# Patient Record
Sex: Male | Born: 1945
Health system: Southern US, Community
[De-identification: ages and names within clinical notes are randomized; demographics above are authoritative.]

## PROBLEM LIST (undated history)

## (undated) DIAGNOSIS — C61 Malignant neoplasm of prostate: Secondary | ICD-10-CM

## (undated) DIAGNOSIS — I639 Cerebral infarction, unspecified: Secondary | ICD-10-CM

## (undated) DIAGNOSIS — N529 Male erectile dysfunction, unspecified: Secondary | ICD-10-CM

## (undated) DIAGNOSIS — N200 Calculus of kidney: Secondary | ICD-10-CM

## (undated) HISTORY — DX: Malignant neoplasm of prostate: C61

## (undated) HISTORY — DX: Cerebral infarction, unspecified: I63.9

## (undated) HISTORY — DX: Calculus of kidney: N20.0

## (undated) HISTORY — DX: Male erectile dysfunction, unspecified: N52.9

## (undated) HISTORY — PX: ROBOT ASSISTED LAPAROSCOPIC RADICAL PROSTATECTOMY: SHX5141

---

## 2009-08-06 DIAGNOSIS — C61 Malignant neoplasm of prostate: Secondary | ICD-10-CM

## 2009-08-06 HISTORY — DX: Malignant neoplasm of prostate: C61

## 2009-10-07 ENCOUNTER — Encounter: Payer: Self-pay | Admitting: Urology

## 2009-10-07 ENCOUNTER — Inpatient Hospital Stay (HOSPITAL_COMMUNITY): Admission: RE | Admit: 2009-10-07 | Discharge: 2009-10-08 | Payer: Self-pay | Admitting: Urology

## 2010-09-21 LAB — TYPE AND SCREEN
ABO/RH(D): A POS
Antibody Screen: NEGATIVE

## 2010-09-21 LAB — BASIC METABOLIC PANEL
Calcium: 7.9 mg/dL — ABNORMAL LOW (ref 8.4–10.5)
Creatinine, Ser: 0.89 mg/dL (ref 0.4–1.5)
GFR calc Af Amer: >60 mL/min (ref >60)

## 2010-09-21 LAB — HEMOGLOBIN AND HEMATOCRIT, BLOOD
HCT: 40.5 % (ref 39.0–52.0)
Hemoglobin: 14.1 g/dL (ref 13.0–17.0)

## 2010-09-21 LAB — CBC
RBC: 4.85 MIL/uL (ref 4.22–5.81)
WBC: 4.5 10*3/uL (ref 4.0–10.5)

## 2010-09-21 LAB — ABO/RH: ABO/RH(D): A POS

## 2011-03-08 ENCOUNTER — Ambulatory Visit
Admission: RE | Admit: 2011-03-08 | Discharge: 2011-03-08 | Disposition: A | Discharge status: Home / Self Care | Payer: Medicare Other | Source: Ambulatory Visit | Attending: Radiation Oncology | Admitting: Radiation Oncology

## 2011-03-08 DIAGNOSIS — C61 Malignant neoplasm of prostate: Secondary | ICD-10-CM | POA: Insufficient documentation

## 2011-03-08 DIAGNOSIS — Z510 Encounter for antineoplastic radiation therapy: Secondary | ICD-10-CM | POA: Insufficient documentation

## 2011-05-02 ENCOUNTER — Encounter: Payer: Self-pay | Admitting: *Deleted

## 2011-05-08 ENCOUNTER — Ambulatory Visit
Admission: RE | Admit: 2011-05-08 | Discharge: 2011-05-08 | Disposition: A | Discharge status: Home / Self Care | Payer: Medicare Other | Source: Ambulatory Visit | Admitting: Radiation Oncology

## 2011-05-08 ENCOUNTER — Ambulatory Visit
Admission: RE | Admit: 2011-05-08 | Discharge: 2011-05-08 | Disposition: A | Discharge status: Home / Self Care | Payer: Medicare Other | Source: Ambulatory Visit | Attending: Radiation Oncology | Admitting: Radiation Oncology

## 2011-05-08 NOTE — Progress Notes (Signed)
[facility] Radiation Oncology Weekly Treatment Note    Name: Billy Fuller Date: 05/08/2011 MRN: [id number] DOB: 28-Nov-1945  Status:outpatient    Current dose: 5220  Current fraction:29  Planned dose:6840  Planned fraction38           NARRATIVE: Billy Fuller was seen today for weekly treatment management. The chart was checked and MVCT images were reviewed.  PHYSICAL EXAMINATION:lungs clear, heart rrr, abd soft , wt 163.8 lbs.     ASSESSMENT: Patient tolerating treatments well.    PLAN: Continue treatment as planned.

## 2011-05-09 ENCOUNTER — Ambulatory Visit
Admission: RE | Admit: 2011-05-09 | Discharge: 2011-05-09 | Disposition: A | Discharge status: Home / Self Care | Payer: Medicare Other | Source: Ambulatory Visit | Attending: Radiation Oncology | Admitting: Radiation Oncology

## 2011-05-10 ENCOUNTER — Ambulatory Visit
Admission: RE | Admit: 2011-05-10 | Discharge: 2011-05-10 | Disposition: A | Discharge status: Home / Self Care | Payer: Medicare Other | Source: Ambulatory Visit | Attending: Radiation Oncology | Admitting: Radiation Oncology

## 2011-05-11 ENCOUNTER — Ambulatory Visit
Admission: RE | Admit: 2011-05-11 | Discharge: 2011-05-11 | Disposition: A | Discharge status: Home / Self Care | Payer: Medicare Other | Source: Ambulatory Visit | Attending: Radiation Oncology | Admitting: Radiation Oncology

## 2011-05-12 ENCOUNTER — Ambulatory Visit: Payer: Medicare Other

## 2011-05-15 ENCOUNTER — Ambulatory Visit
Admission: RE | Admit: 2011-05-15 | Discharge: 2011-05-15 | Disposition: A | Discharge status: Home / Self Care | Payer: Medicare Other | Source: Ambulatory Visit | Attending: Radiation Oncology | Admitting: Radiation Oncology

## 2011-05-16 ENCOUNTER — Ambulatory Visit
Admission: RE | Admit: 2011-05-16 | Discharge: 2011-05-16 | Disposition: A | Discharge status: Home / Self Care | Payer: Medicare Other | Source: Ambulatory Visit | Attending: Radiation Oncology | Admitting: Radiation Oncology

## 2011-05-16 VITALS — BP 123/62 | HR 75 | Temp 97.0°F | Wt 165.4 lb

## 2011-05-16 DIAGNOSIS — C61 Malignant neoplasm of prostate: Secondary | ICD-10-CM

## 2011-05-16 NOTE — Progress Notes (Signed)
DIAGNOSIS:  Recurrent prostate cancer.  NARRATIVE:  Mr. Pavao is seen today for weekly assessment.  He has completed 5940 cGy of a planned 6840 cGy directed at the lower pelvis area.  The patient continues to tolerate his treatments quite well without any  significant fatigue, bowel or bladder complaints.  PHYSICAL EXAMINATION:  The patient's weight is 165.4 pounds.  The lungs are clear.  The heart has a regular rhythm and rate.  The abdomen is soft and nontender with normal bowel sounds.  IMPRESSION/PLAN:  The patient is tolerating his radiation treatments extremely well at this time.  The patient's radiation fields are setting up accurately.  The patient's radiation chart was checked today.  Plan is to continue to a cumulative dose of 6840 cGy as salvage therapy.    ______________________________ [name], Ph.D., M.D. JDK/MEDQ  D:  05/16/2011  T:  05/16/2011  Job:  [id number]

## 2011-05-16 NOTE — Progress Notes (Signed)
Denies any fatigue,  urinary frequency, dysuria, urinary urgency diarrhea, or proctitis.

## 2011-05-17 ENCOUNTER — Ambulatory Visit
Admission: RE | Admit: 2011-05-17 | Discharge: 2011-05-17 | Disposition: A | Discharge status: Home / Self Care | Payer: Medicare Other | Source: Ambulatory Visit | Attending: Radiation Oncology | Admitting: Radiation Oncology

## 2011-05-18 ENCOUNTER — Ambulatory Visit
Admission: RE | Admit: 2011-05-18 | Discharge: 2011-05-18 | Disposition: A | Discharge status: Home / Self Care | Payer: Medicare Other | Source: Ambulatory Visit | Attending: Radiation Oncology | Admitting: Radiation Oncology

## 2011-05-19 ENCOUNTER — Ambulatory Visit
Admission: RE | Admit: 2011-05-19 | Discharge: 2011-05-19 | Disposition: A | Discharge status: Home / Self Care | Payer: Medicare Other | Source: Ambulatory Visit | Attending: Radiation Oncology | Admitting: Radiation Oncology

## 2011-05-22 ENCOUNTER — Ambulatory Visit
Admission: RE | Admit: 2011-05-22 | Discharge: 2011-05-22 | Disposition: A | Discharge status: Home / Self Care | Payer: Medicare Other | Source: Ambulatory Visit | Attending: Radiation Oncology | Admitting: Radiation Oncology

## 2011-05-22 VITALS — Wt 167.2 lb

## 2011-05-22 DIAGNOSIS — C61 Malignant neoplasm of prostate: Secondary | ICD-10-CM

## 2011-05-22 NOTE — Progress Notes (Signed)
CC:   Excell Seltzer. Annabell Howells, M.D. Emeterio Reeve, MD  DIAGNOSIS:  Recurrent prostate cancer.  INDICATION FOR THERAPY:  Salvage treatment.  TREATMENT DATES:  March 27, 2011 through May 22, 2011.  SITES/DOSE:  Central lower pelvis area, 6840 cGy in 38 fractions (180 cGy per fraction).  ENERGY/FIELDS:  The patient was treated with helical IMRT throughout his course of treatment.  6 MV photons were used to deliver the patient's treatment.  In addition, the patient underwent image-guided therapy with onboard megavoltage CT scanner.  NARRATIVE:  Mr. Billy Fuller tolerated his treatments extremely well.  He denies experiencing any significant fatigue, GU, or GI symptoms throughout his course of treatment.  FOLLOW UP APPOINTMENT:  One month.    ______________________________ [name], Ph.D., M.D. JDK/MEDQ  D:  05/22/2011  T:  05/22/2011  Job:  [id number]

## 2011-05-22 NOTE — Progress Notes (Signed)
DIAGNOSIS:  Recurrent prostate cancer.  NARRATIVE:  Billy Fuller was seen today for weekly assessment.  He completed his last radiation therapy directed at the "prostate bed" earlier today, totaling 6840 cGy.  The patient continues to tolerate his treatments essentially without any side effects.  He specifically denies any bowel or bladder problems, or fatigue.  EXAMINATION:  The patient's weight is 167, which is up a couple pounds since weighing last week.  The abdomen is soft and nontender with normal bowel sounds.  IMPRESSION AND PLAN:  The patient is tolerating his treatments quite well.  The patient's radiation fields were set up accurately throughout his course of treatment.  The patient's radiation chart was checked today.  Plan is to followup in mid December.    ______________________________ Billy Fuller, Ph.D., M.D. JDK/MEDQ  D:  05/22/2011  T:  05/22/2011  Job:  [id number]

## 2011-05-22 NOTE — Procedures (Signed)
DIAGNOSIS:  Recurrent prostate cancer.  NARRATIVE:  On June 19, 2011, Mr. Billy Fuller had completion of his IMRT plan in generation of his IMRT treatment device.  The patient will be treated with 2.9 sonogram segments on the tomotherapy unit.  This constitutes 1 IMRT device.    ______________________________ [name], Ph.D., M.D. JDK/MEDQ  D:  05/22/2011  T:  05/22/2011  Job:  [id number]

## 2011-05-22 NOTE — Progress Notes (Signed)
Pt completed today, "i'm exactly the same, no changes  No fatigue, no pain,nothing", f/u appt card given dec 17,2012  4:52 PM

## 2011-05-22 NOTE — Progress Notes (Signed)
DIAGNOSIS:  Recurrent prostate cancer.  NARRATIVE:  On March 20, 2011, Billy Fuller had completion of his IMRT plan directed at the central lower pelvis area ("prostate bed"). IMRT was chosen over conventional or conformal radiation therapy to more accurately target the area of concern and to limit dose to normal surrounding critical structures, i.e., the bladder and rectum.  Dose volume histograms of the target area as well as critical structures were reviewed, accepted, and placed in the patient's chart.  Plan is for the patient received 38 treatments at 180 cGy per day for a cumulative dose to the target area of 6840 cGy.  6 MV photons will be used to deliver the patient's treatment.    ______________________________ [name], Ph.D., M.D. JDK/MEDQ  D:  05/22/2011  T:  05/22/2011  Job:  [id number]

## 2011-06-14 ENCOUNTER — Encounter: Payer: Self-pay | Admitting: *Deleted

## 2011-06-14 ENCOUNTER — Encounter: Payer: Self-pay | Admitting: Radiation Oncology

## 2011-06-14 NOTE — Progress Notes (Signed)
Follow up recurrent prostate cancer Radiation therapy 03/27/11-05/22/11 central lower pelvis area,  Dx 08/06/09 psa then 6.23,volume=40cc,gleason-3+4=7 and 3+3=6 Last Psa 02/14/11= 0.07Dr. Rolanda Lundborg Shagger/part time painter/construction  Nkda

## 2011-06-19 ENCOUNTER — Ambulatory Visit: Payer: Medicare Other | Admitting: Radiation Oncology

## 2011-07-17 ENCOUNTER — Ambulatory Visit: Payer: Medicare Other | Admitting: Radiation Oncology

## 2011-07-24 ENCOUNTER — Ambulatory Visit: Payer: Medicare Other | Admitting: Radiation Oncology

## 2011-08-07 ENCOUNTER — Ambulatory Visit
Admission: RE | Admit: 2011-08-07 | Discharge: 2011-08-07 | Disposition: A | Discharge status: Home / Self Care | Payer: Medicare Other | Source: Ambulatory Visit | Attending: Radiation Oncology | Admitting: Radiation Oncology

## 2011-08-07 VITALS — BP 134/83 | HR 73 | Temp 97.6°F | Wt 162.1 lb

## 2011-08-07 DIAGNOSIS — C61 Malignant neoplasm of prostate: Secondary | ICD-10-CM

## 2011-08-07 NOTE — Progress Notes (Signed)
Here for routine follow up post radiation  to prostate. Doing great.Denies frequency,burning and nocturia.

## 2011-08-07 NOTE — Progress Notes (Signed)
CC:   Excell Seltzer. Annabell Howells, M.D. Emeterio Reeve, MD  DIAGNOSIS:  Recurrent prostate cancer.  INTERVAL SINCE RADIATION THERAPY:  2-1/2 months.  NARRATIVE:  Mr. Billy Fuller comes in today for routine followup.  He clinically is doing quite well.  The patient denies any dysuria, hematuria, increased frequency or nocturia.  The patient denies any bowel complaints.  His energy level is good.  The patient did see Dr. Annabell Howells late last month and the patient's post treatment PSA was down to 0.03.  The patient's pretreatment PSA was 0.07.  PHYSICAL EXAMINATION:  Today the patient's temperature is 97.6, pulse 73, blood pressure is 134/83, weight is 162 pounds.  Examination of the neck and supraclavicular region reveals no evidence of adenopathy.  The lungs are clear to auscultation.  The heart has a regular rhythm and rate.  The abdomen is soft and nontender with normal bowel sounds.  IMPRESSION/PLAN:  The patient is doing well at this time with a nice PSA drop.  The patient will see Dr. Annabell Howells later this spring.  Routine followup in Radiation Oncology in 6 months.    ______________________________ [name], Ph.D., M.D. JDK/MEDQ  D:  08/07/2011  T:  08/07/2011  Job:  [id number]

## 2012-01-31 ENCOUNTER — Encounter: Payer: Self-pay | Admitting: Radiation Oncology

## 2012-01-31 ENCOUNTER — Ambulatory Visit
Admission: RE | Admit: 2012-01-31 | Discharge: 2012-01-31 | Disposition: A | Discharge status: Home / Self Care | Payer: Medicare Other | Source: Ambulatory Visit | Attending: Radiation Oncology | Admitting: Radiation Oncology

## 2012-01-31 VITALS — BP 120/75 | HR 71 | Temp 97.6°F | Resp 20 | Wt 164.6 lb

## 2012-01-31 DIAGNOSIS — C61 Malignant neoplasm of prostate: Secondary | ICD-10-CM

## 2012-01-31 NOTE — Progress Notes (Signed)
Pt denies pain, fatigue, loss of appetite, urinary, bowels problems.

## 2012-01-31 NOTE — Progress Notes (Signed)
  Radiation Oncology         [phone] ________________________________  Name: Billy Fuller MRN: [id number]  Date: 01/31/2012  DOB: 03/25/1946  Follow-Up Visit Note  CC: No primary provider on file.  Anner Crete, MD  Diagnosis:   Recurrent prostate cancer  Interval Since Last Radiation:  8 months  Narrative:  The patient returns today for routine follow-up.  He clinically seems to be doing well at this time. Patient denies any urination difficulties hematuria or bowel complaints. He denies any rectal bleeding. After completion of the patient's salvage radiation therapy his PSA did drop to 0.03 ng/mL. However in April patient's PSA rose to 0.06 and now most recent PSA July 15 showed a further rise to 0.33 ng/mL.  Patient did meet with Dr. Annabell Howells this month and discussed  androgen ablation therapy.     There are no immediate plans to initiate this therapy.                      ALLERGIES:   has no known allergies.  Meds: No current outpatient prescriptions on file.    Physical Findings: The patient is in no acute distress. Patient is alert and oriented.  weight is 164 lb 9.6 oz (74.662 kg). His oral temperature is 97.6 F (36.4 C). His blood pressure is 120/75 and his pulse is 71. His respiration is 20. Marland Kitchen  No palpable cervical subclavicular or axillary adenopathy. The lungs are clear to auscultation. The heart has regular rhythm and rate. The abdomen is soft and nontender with normal bowel sounds.  Lab Findings: Lab Results  Component Value Date   WBC 4.5 10/01/2009   HGB 12.8* 10/08/2009   HCT 36.6* 10/08/2009   MCV 94.7 10/01/2009   PLT 143* 10/01/2009    @LASTCHEM @  Radiographic Findings: No results found.  Impression:  The patient is clinically doing well after his salvage radiation therapy. He however has biochemical evidence of progressive disease with a rising PSA.  Plan:  When necessary followup.  The patient will continue close followup with Dr.  Annabell Howells.  _____________________________________    [name], PhD, MD

## 2012-02-05 ENCOUNTER — Ambulatory Visit: Payer: Medicare Other | Admitting: Radiation Oncology

## 2012-02-12 ENCOUNTER — Ambulatory Visit: Payer: Medicare Other | Admitting: Radiation Oncology

## 2012-03-08 ENCOUNTER — Other Ambulatory Visit: Payer: Self-pay | Admitting: Urology

## 2012-03-08 DIAGNOSIS — C61 Malignant neoplasm of prostate: Secondary | ICD-10-CM

## 2012-03-20 ENCOUNTER — Encounter (HOSPITAL_COMMUNITY)
Admission: RE | Admit: 2012-03-20 | Discharge: 2012-03-20 | Disposition: A | Discharge status: Home / Self Care | Payer: Medicare Other | Source: Ambulatory Visit | Attending: Urology | Admitting: Urology

## 2012-03-20 ENCOUNTER — Encounter (HOSPITAL_COMMUNITY): Payer: Self-pay

## 2012-03-20 DIAGNOSIS — C61 Malignant neoplasm of prostate: Secondary | ICD-10-CM | POA: Insufficient documentation

## 2012-03-20 DIAGNOSIS — Z006 Encounter for examination for normal comparison and control in clinical research program: Secondary | ICD-10-CM | POA: Insufficient documentation

## 2012-03-20 MED ORDER — FLUDEOXYGLUCOSE F - 18 (FDG) INJECTION
10.8000 | Freq: Once | INTRAVENOUS | Status: AC | PRN
  Administered 2012-03-20: 10.8 via INTRAVENOUS

## 2012-07-12 ENCOUNTER — Other Ambulatory Visit: Payer: Self-pay | Admitting: Urology

## 2012-07-12 DIAGNOSIS — C61 Malignant neoplasm of prostate: Secondary | ICD-10-CM

## 2012-08-05 ENCOUNTER — Encounter (HOSPITAL_COMMUNITY)
Admission: RE | Admit: 2012-08-05 | Discharge: 2012-08-05 | Disposition: A | Discharge status: Home / Self Care | Payer: Medicare Other | Source: Ambulatory Visit | Attending: Urology | Admitting: Urology

## 2012-08-05 DIAGNOSIS — C61 Malignant neoplasm of prostate: Secondary | ICD-10-CM | POA: Insufficient documentation

## 2012-08-05 MED ORDER — TECHNETIUM TC 99M MEDRONATE IV KIT
25.0000 | PACK | Freq: Once | INTRAVENOUS | Status: AC | PRN
  Administered 2012-08-05: 25 via INTRAVENOUS

## 2012-12-13 ENCOUNTER — Other Ambulatory Visit: Payer: Self-pay | Admitting: Urology

## 2012-12-13 DIAGNOSIS — C61 Malignant neoplasm of prostate: Secondary | ICD-10-CM

## 2012-12-18 ENCOUNTER — Encounter (HOSPITAL_COMMUNITY): Payer: Self-pay

## 2012-12-18 ENCOUNTER — Encounter (HOSPITAL_COMMUNITY)
Admission: RE | Admit: 2012-12-18 | Discharge: 2012-12-18 | Disposition: A | Discharge status: Home / Self Care | Payer: Medicare Other | Source: Ambulatory Visit | Attending: Urology | Admitting: Urology

## 2012-12-18 DIAGNOSIS — C61 Malignant neoplasm of prostate: Secondary | ICD-10-CM

## 2012-12-18 MED ORDER — FLUDEOXYGLUCOSE F - 18 (FDG) INJECTION
13.1000 | Freq: Once | INTRAVENOUS | Status: AC | PRN
  Administered 2012-12-18: 13.1 via INTRAVENOUS

## 2014-08-13 DIAGNOSIS — G5601 Carpal tunnel syndrome, right upper limb: Secondary | ICD-10-CM | POA: Diagnosis not present

## 2014-08-13 DIAGNOSIS — G5602 Carpal tunnel syndrome, left upper limb: Secondary | ICD-10-CM | POA: Diagnosis not present

## 2014-09-01 DIAGNOSIS — G5602 Carpal tunnel syndrome, left upper limb: Secondary | ICD-10-CM | POA: Diagnosis not present

## 2014-09-01 DIAGNOSIS — G5601 Carpal tunnel syndrome, right upper limb: Secondary | ICD-10-CM | POA: Diagnosis not present

## 2014-09-09 DIAGNOSIS — G5601 Carpal tunnel syndrome, right upper limb: Secondary | ICD-10-CM | POA: Diagnosis not present

## 2014-09-23 DIAGNOSIS — C61 Malignant neoplasm of prostate: Secondary | ICD-10-CM | POA: Diagnosis not present

## 2014-10-16 DIAGNOSIS — M79644 Pain in right finger(s): Secondary | ICD-10-CM | POA: Diagnosis not present

## 2014-10-16 DIAGNOSIS — G5601 Carpal tunnel syndrome, right upper limb: Secondary | ICD-10-CM | POA: Diagnosis not present

## 2014-10-16 DIAGNOSIS — M25441 Effusion, right hand: Secondary | ICD-10-CM | POA: Diagnosis not present

## 2014-10-16 DIAGNOSIS — M25641 Stiffness of right hand, not elsewhere classified: Secondary | ICD-10-CM | POA: Diagnosis not present

## 2014-10-20 DIAGNOSIS — G5601 Carpal tunnel syndrome, right upper limb: Secondary | ICD-10-CM | POA: Diagnosis not present

## 2014-10-20 DIAGNOSIS — M25641 Stiffness of right hand, not elsewhere classified: Secondary | ICD-10-CM | POA: Diagnosis not present

## 2014-10-20 DIAGNOSIS — M25441 Effusion, right hand: Secondary | ICD-10-CM | POA: Diagnosis not present

## 2014-10-20 DIAGNOSIS — M79644 Pain in right finger(s): Secondary | ICD-10-CM | POA: Diagnosis not present

## 2015-04-05 DIAGNOSIS — C61 Malignant neoplasm of prostate: Secondary | ICD-10-CM | POA: Diagnosis not present

## 2015-04-21 DIAGNOSIS — Z23 Encounter for immunization: Secondary | ICD-10-CM | POA: Diagnosis not present

## 2015-04-26 DIAGNOSIS — N393 Stress incontinence (female) (male): Secondary | ICD-10-CM | POA: Diagnosis not present

## 2015-04-26 DIAGNOSIS — C61 Malignant neoplasm of prostate: Secondary | ICD-10-CM | POA: Diagnosis not present

## 2015-07-09 DIAGNOSIS — R05 Cough: Secondary | ICD-10-CM | POA: Diagnosis not present

## 2015-07-09 DIAGNOSIS — J069 Acute upper respiratory infection, unspecified: Secondary | ICD-10-CM | POA: Diagnosis not present

## 2015-07-09 DIAGNOSIS — R0602 Shortness of breath: Secondary | ICD-10-CM | POA: Diagnosis not present

## 2015-07-09 DIAGNOSIS — R062 Wheezing: Secondary | ICD-10-CM | POA: Diagnosis not present

## 2015-09-24 DIAGNOSIS — L989 Disorder of the skin and subcutaneous tissue, unspecified: Secondary | ICD-10-CM | POA: Diagnosis not present

## 2015-10-06 DIAGNOSIS — D485 Neoplasm of uncertain behavior of skin: Secondary | ICD-10-CM | POA: Diagnosis not present

## 2015-10-06 DIAGNOSIS — L821 Other seborrheic keratosis: Secondary | ICD-10-CM | POA: Diagnosis not present

## 2015-10-06 DIAGNOSIS — C44519 Basal cell carcinoma of skin of other part of trunk: Secondary | ICD-10-CM | POA: Diagnosis not present

## 2015-11-08 DIAGNOSIS — Z85828 Personal history of other malignant neoplasm of skin: Secondary | ICD-10-CM | POA: Diagnosis not present

## 2015-11-08 DIAGNOSIS — D485 Neoplasm of uncertain behavior of skin: Secondary | ICD-10-CM | POA: Diagnosis not present

## 2015-11-08 DIAGNOSIS — Z08 Encounter for follow-up examination after completed treatment for malignant neoplasm: Secondary | ICD-10-CM | POA: Diagnosis not present

## 2016-04-11 DIAGNOSIS — C61 Malignant neoplasm of prostate: Secondary | ICD-10-CM | POA: Diagnosis not present

## 2016-04-19 DIAGNOSIS — N393 Stress incontinence (female) (male): Secondary | ICD-10-CM | POA: Diagnosis not present

## 2016-04-19 DIAGNOSIS — Z8546 Personal history of malignant neoplasm of prostate: Secondary | ICD-10-CM | POA: Diagnosis not present

## 2016-04-19 DIAGNOSIS — N5201 Erectile dysfunction due to arterial insufficiency: Secondary | ICD-10-CM | POA: Diagnosis not present

## 2016-05-01 ENCOUNTER — Ambulatory Visit (INDEPENDENT_AMBULATORY_CARE_PROVIDER_SITE_OTHER): Payer: Medicare Other | Admitting: Family Medicine

## 2016-05-01 VITALS — BP 128/72 | HR 75 | Temp 97.9°F | Resp 18 | Ht 65.0 in | Wt 170.2 lb

## 2016-05-01 DIAGNOSIS — H6121 Impacted cerumen, right ear: Secondary | ICD-10-CM

## 2016-05-01 NOTE — Progress Notes (Signed)
   Subjective:    Patient ID: Brown Boros, male    DOB: 11-Dec-1945, 70 y.o.   MRN: [id number]  Chief Complaint  Patient presents with  . Ear Cleaning    right ear    PCP: No PCP Per Patient  HPI  This is a 70 y.o. male who is presenting with cerumen impaction. He was going to get his hearing aids fixed and was told he needed his ear cleaned out. He has had hearing aids for year. He denies any acute changes. He denies any trauma to his ear.  ROS: No unexpected weight loss, fever, chills, swelling, instability, muscle pain, numbness/tingling, redness, otherwise see HPI .   Review of Systems  PMH: prostate cancer  PShx: prostate removal  PSx: no tobacco use, occasional alcohol use  FHx: none     Objective:   Physical Exam BP 128/72   Pulse 75   Temp 97.9 F (36.6 C) (Oral)   Resp 18   Ht 5\' 5"  (1.651 m)   Wt 170 lb 3.2 oz (77.2 kg)   SpO2 97%   BMI 28.32 kg/m  Gen: NAD, alert, cooperative with exam, well-appearing HEENT:  EOMI, clear conjunctiva, occluded right ear canal, Left ear canal is clear.  Skin: no rashes, normal turgor  Neuro: no gross deficits.  Psych: alert and oriented      Assessment & Plan:   Impacted cerumen of right ear Able to visualize TM after irrigation but wax is still present. He didn't tolerate removal with alligator clamps.  - advised to try debrox.  - f/u PRN.

## 2016-05-01 NOTE — Assessment & Plan Note (Signed)
Able to visualize TM after irrigation but wax is still present. He didn't tolerate removal with alligator clamps.  - advised to try debrox.  - f/u PRN.

## 2016-05-01 NOTE — Patient Instructions (Addendum)
  Thank you for coming in,   Please try debrox which is an over the counter medication which helps loosen ear wax.    Please feel free to call with any questions or concerns at any time, at [phone]. --Dr. Raeford Razor    IF you received an x-ray today, you will receive an invoice from [facility]. Please contact [facility] at [phone] with questions or concerns regarding your invoice.   IF you received labwork today, you will receive an invoice from [employer]. Please contact [facility] at [phone] with questions or concerns regarding your invoice.   Our billing staff will not be able to assist you with questions regarding bills from these companies.  You will be contacted with the lab results as soon as they are available. The fastest way to get your results is to activate your My Chart account. Instructions are located on the last page of this paperwork. If you have not heard from [place] regarding the results in 2 weeks, please contact this office.

## 2016-05-05 DIAGNOSIS — Z23 Encounter for immunization: Secondary | ICD-10-CM | POA: Diagnosis not present

## 2016-05-30 ENCOUNTER — Encounter (HOSPITAL_COMMUNITY): Payer: Self-pay

## 2016-05-30 ENCOUNTER — Inpatient Hospital Stay (HOSPITAL_COMMUNITY)
Admission: EM | Admit: 2016-05-30 | Discharge: 2016-06-01 | DRG: 065 | Disposition: A | Discharge status: Home Health | Payer: Medicare Other | Attending: Family Medicine | Admitting: Family Medicine

## 2016-05-30 ENCOUNTER — Emergency Department (HOSPITAL_COMMUNITY): Payer: Medicare Other

## 2016-05-30 DIAGNOSIS — R269 Unspecified abnormalities of gait and mobility: Secondary | ICD-10-CM | POA: Diagnosis not present

## 2016-05-30 DIAGNOSIS — R531 Weakness: Secondary | ICD-10-CM | POA: Diagnosis not present

## 2016-05-30 DIAGNOSIS — R29818 Other symptoms and signs involving the nervous system: Secondary | ICD-10-CM | POA: Diagnosis not present

## 2016-05-30 DIAGNOSIS — G8194 Hemiplegia, unspecified affecting left nondominant side: Secondary | ICD-10-CM | POA: Diagnosis present

## 2016-05-30 DIAGNOSIS — Z87891 Personal history of nicotine dependence: Secondary | ICD-10-CM

## 2016-05-30 DIAGNOSIS — G459 Transient cerebral ischemic attack, unspecified: Secondary | ICD-10-CM | POA: Diagnosis present

## 2016-05-30 DIAGNOSIS — I639 Cerebral infarction, unspecified: Principal | ICD-10-CM | POA: Diagnosis present

## 2016-05-30 DIAGNOSIS — Z8546 Personal history of malignant neoplasm of prostate: Secondary | ICD-10-CM

## 2016-05-30 DIAGNOSIS — R278 Other lack of coordination: Secondary | ICD-10-CM | POA: Diagnosis not present

## 2016-05-30 DIAGNOSIS — R200 Anesthesia of skin: Secondary | ICD-10-CM | POA: Diagnosis not present

## 2016-05-30 DIAGNOSIS — E782 Mixed hyperlipidemia: Secondary | ICD-10-CM | POA: Diagnosis present

## 2016-05-30 DIAGNOSIS — I6789 Other cerebrovascular disease: Secondary | ICD-10-CM | POA: Diagnosis not present

## 2016-05-30 LAB — I-STAT CHEM 8, ED
BUN: 15 mg/dL (ref 6–20)
Calcium, Ion: 1.15 mmol/L (ref 1.15–1.40)
Chloride: 103 mmol/L (ref 101–111)
Creatinine, Ser: 1.2 mg/dL (ref 0.61–1.24)
Glucose, Bld: 123 mg/dL — ABNORMAL HIGH (ref 65–99)
HEMATOCRIT: 48 % (ref 39.0–52.0)
HEMOGLOBIN: 16.3 g/dL (ref 13.0–17.0)
Potassium: 3.7 mmol/L (ref 3.5–5.1)
SODIUM: 141 mmol/L (ref 135–145)
TCO2: 25 mmol/L (ref 0–100)

## 2016-05-30 LAB — DIFFERENTIAL
BASOS ABS: 0 10*3/uL (ref 0.0–0.1)
BASOS PCT: 1 %
EOS ABS: 0.1 10*3/uL (ref 0.0–0.7)
Eosinophils Relative: 1 %
Lymphocytes Relative: 31 %
Lymphs Abs: 1.6 10*3/uL (ref 0.7–4.0)
MONO ABS: 0.5 10*3/uL (ref 0.1–1.0)
MONOS PCT: 11 %
Neutro Abs: 2.9 10*3/uL (ref 1.7–7.7)
Neutrophils Relative %: 56 %

## 2016-05-30 LAB — COMPREHENSIVE METABOLIC PANEL
ALT: 18 U/L (ref 17–63)
AST: 24 U/L (ref 15–41)
Albumin: 4.2 g/dL (ref 3.5–5.0)
Alkaline Phosphatase: 59 U/L (ref 38–126)
Anion gap: 9 (ref 5–15)
BUN: 13 mg/dL (ref 6–20)
CHLORIDE: 105 mmol/L (ref 101–111)
CO2: 25 mmol/L (ref 22–32)
Calcium: 9.6 mg/dL (ref 8.9–10.3)
Creatinine, Ser: 1.17 mg/dL (ref 0.61–1.24)
Glucose, Bld: 124 mg/dL — ABNORMAL HIGH (ref 65–99)
POTASSIUM: 3.7 mmol/L (ref 3.5–5.1)
Sodium: 139 mmol/L (ref 135–145)
Total Bilirubin: 0.7 mg/dL (ref 0.3–1.2)
Total Protein: 6.3 g/dL — ABNORMAL LOW (ref 6.5–8.1)

## 2016-05-30 LAB — CBC
HCT: 45.5 % (ref 39.0–52.0)
Hemoglobin: 16.6 g/dL (ref 13.0–17.0)
MCH: 32.9 pg (ref 26.0–34.0)
MCHC: 36.5 g/dL — AB (ref 30.0–36.0)
MCV: 90.1 fL (ref 78.0–100.0)
PLATELETS: 133 10*3/uL — AB (ref 150–400)
RBC: 5.05 MIL/uL (ref 4.22–5.81)
RDW: 12.8 % (ref 11.5–15.5)
WBC: 5.1 10*3/uL (ref 4.0–10.5)

## 2016-05-30 LAB — PROTIME-INR
INR: 0.96
Prothrombin Time: 12.8 seconds (ref 11.4–15.2)

## 2016-05-30 LAB — I-STAT TROPONIN, ED: TROPONIN I, POC: 0 ng/mL (ref 0.00–0.08)

## 2016-05-30 LAB — CBG MONITORING, ED: Glucose-Capillary: 82 mg/dL (ref 65–99)

## 2016-05-30 LAB — APTT: aPTT: 28 seconds (ref 24–36)

## 2016-05-30 MED ORDER — SODIUM CHLORIDE 0.9% FLUSH
3.0000 mL | Freq: Two times a day (BID) | INTRAVENOUS | Status: DC
  Administered 2016-05-30 – 2016-06-01 (×3): 3 mL via INTRAVENOUS

## 2016-05-30 MED ORDER — ENOXAPARIN SODIUM 40 MG/0.4ML ~~LOC~~ SOLN
40.0000 mg | SUBCUTANEOUS | Status: DC
  Administered 2016-05-30 – 2016-05-31 (×2): 40 mg via SUBCUTANEOUS
  Filled 2016-05-30 (×2): qty 0.4

## 2016-05-30 MED ORDER — SODIUM CHLORIDE 0.45 % IV SOLN: INTRAVENOUS | Status: DC

## 2016-05-30 MED ORDER — ASPIRIN EC 81 MG PO TBEC
81.0000 mg | DELAYED_RELEASE_TABLET | Freq: Every day | ORAL | Status: DC
  Administered 2016-05-30 – 2016-05-31 (×2): 81 mg via ORAL
  Filled 2016-05-30 (×2): qty 1

## 2016-05-30 MED ORDER — ACETAMINOPHEN 650 MG RE SUPP
650.0000 mg | Freq: Four times a day (QID) | RECTAL | Status: DC | PRN
Start: 2016-05-30 — End: 2016-06-01

## 2016-05-30 MED ORDER — ACETAMINOPHEN 325 MG PO TABS: 650.0000 mg | ORAL_TABLET | Freq: Four times a day (QID) | ORAL | Status: DC | PRN

## 2016-05-30 NOTE — Code Documentation (Signed)
70 year old male presents to [facility] as code stroke.  Patient is alert - states he was normal at 1130 - at 1200 he went to kitchen to prepare food and then his left side became weak and numb - he fell as his leg gave away and was unable to hold a glass in his hand.  He thought his left side had gone to sleep after sitting for a while so he did nothing about it.  Later on he got up 3 other times and fell also.  EMS reports left side weakness.  On arrival he has no drift - ataxia left arm and leg - and some sensory loss on left.  NIHHS 3.  Dr. Shon Hale to bedside.  Outside tPA window.  No acute treatment ordered.  Handoff to [employer] - to call as needed if symptoms worsen.

## 2016-05-30 NOTE — ED Provider Notes (Signed)
[place] DEPT Provider Note   CSN: [id number] Arrival date & time: 05/30/16  [id number]     History   Chief Complaint Chief Complaint  Patient presents with  . Code Stroke    HPI Billy Fuller is a 70 y.o. male.  HPI Patient is a 70 year old male who presents with left sided weakness and numbness. Patient reports he is in his usual state of health today until he experienced multiple falls to the left side. Following this he noticed notable weakness in his left leg. Patient last normal at 11:30 AM. He put something in an intact family. After 30 minutes he attempted to stand up and fell to the left due to left leg numbness and weakness. He was subsequently able to ambulate but then experienced progressive left sided numbness and weakness involving his left neck/jaw, left arm and left leg and experienced 3 more falls. In addition patient had difficulty with handgrip on the left. Patient finally told his fiance that his symptoms who called EMS. Code stroke initiated prior to arrival.   Past Medical History:  Diagnosis Date  . Cataract    eye implants  . ED (erectile dysfunction)   . Nephrolithiasis   . Prostate CA ([place]) 08/06/09   recurrent  . Wears dentures    upper    Patient Active Problem List   Diagnosis Date Noted  . Left-sided weakness 05/30/2016  . TIA (transient ischemic attack) 05/30/2016  . Impacted cerumen of right ear 05/01/2016  . Prostate cancer ([place]) 05/16/2011  . Prostate CA ([place]) 08/06/2009    Past Surgical History:  Procedure Laterality Date  . ROBOT ASSISTED LAPAROSCOPIC RADICAL PROSTATECTOMY     10/06/09       Home Medications    Prior to Admission medications   Not on File    Family History Family History  Problem Relation Age of Onset  . Cancer Mother     breast  . Kidney failure Father     Social History Social History  Substance Use Topics  . Smoking status: Former Smoker    Quit date: 05/31/1979  . Smokeless tobacco: Never Used   . Alcohol use Yes     Comment: 4 alcoholic beverages/week     Allergies   Patient has no known allergies.   Review of Systems Review of Systems  Constitutional: Negative for chills and fever.  HENT: Negative for ear pain and sore throat.   Eyes: Negative for pain and visual disturbance.  Respiratory: Negative for cough and shortness of breath.   Cardiovascular: Negative for chest pain and palpitations.  Gastrointestinal: Negative for abdominal pain and vomiting.  Genitourinary: Negative for dysuria and hematuria.  Musculoskeletal: Negative for arthralgias and back pain.  Skin: Negative for color change and rash.  Neurological: Positive for weakness and numbness. Negative for dizziness, seizures, syncope, facial asymmetry and headaches.  All other systems reviewed and are negative.    Physical Exam Updated Vital Signs BP 125/78 (BP Location: Right Arm)   Pulse 61   Temp 98.2 F (36.8 C) (Oral)   Resp 18   Ht 5\' 5"  (1.651 m)   Wt 77.6 kg   SpO2 98%   BMI 28.46 kg/m   Physical Exam  Constitutional: He is oriented to person, place, and time. He appears well-developed and well-nourished.  HENT:  Head: Normocephalic and atraumatic.  Eyes: Conjunctivae are normal.  Neck: Neck supple.  Cardiovascular: Normal rate and regular rhythm.   No murmur heard. Pulmonary/Chest: Effort normal and breath  sounds normal. No respiratory distress.  Abdominal: Soft. There is no tenderness.  Musculoskeletal: He exhibits no edema.  Neurological: He is alert and oriented to person, place, and time.  4+/5 strength LUE and LLE. 5/5 strength RUE and RLE. Mild left sided dysmetria.   Skin: Skin is warm and dry.  Psychiatric: He has a normal mood and affect.  Nursing note and vitals reviewed.    ED Treatments / Results  Labs (all labs ordered are listed, but only abnormal results are displayed) Labs Reviewed  CBC - Abnormal; Notable for the following:       Result Value   MCHC 36.5  (*)    Platelets 133 (*)    All other components within normal limits  COMPREHENSIVE METABOLIC PANEL - Abnormal; Notable for the following:    Glucose, Bld 124 (*)    Total Protein 6.3 (*)    All other components within normal limits  I-STAT CHEM 8, ED - Abnormal; Notable for the following:    Glucose, Bld 123 (*)    All other components within normal limits  PROTIME-INR  APTT  DIFFERENTIAL  HEMOGLOBIN A1C  COMPREHENSIVE METABOLIC PANEL  CBC  LIPID PANEL  I-STAT TROPOININ, ED  CBG MONITORING, ED    EKG  EKG Interpretation None       Radiology Ct Head Code Stroke W/o Cm  Result Date: 05/30/2016 CLINICAL DATA:  Code stroke. 70 year old male left lower extremity weakness. Last seen normal levin 30 hours. Initial encounter. EXAM: CT HEAD WITHOUT CONTRAST TECHNIQUE: Contiguous axial images were obtained from the base of the skull through the vertex without intravenous contrast. COMPARISON:  PET-CT 12/18/2012. FINDINGS: Brain: No midline shift, ventriculomegaly, mass effect, evidence of mass lesion, intracranial hemorrhage or evidence of cortically based acute infarction. Gray-white matter differentiation is within normal limits throughout the brain. No cortical encephalomalacia identified. Vascular: Calcified atherosclerosis at the skull base. No suspicious intracranial vascular hyperdensity. Skull: No acute osseous abnormality identified. Sinuses/Orbits: Visualized paranasal sinuses and mastoids are stable and well pneumatized. Incidental bilateral EAC hearing aids. Other: No acute orbit or scalp soft tissue finding. ASPECTS [facility] Stroke Program Early CT Score) - Ganglionic level infarction (caudate, lentiform nuclei, internal capsule, insula, M1-M3 cortex): 7 - Supraganglionic infarction (M4-M6 cortex): 3 Total score (0-10 with 10 being normal): 10 IMPRESSION: 1. Normal noncontrast CT appearance of the brain. 2. ASPECTS is 10. 3. The above was relayed via text pager to Dr. Milus Banister on  05/30/2016 at 17:59 . Electronically Signed   By: Genevie Bion Todorov M.D.   On: 05/30/2016 17:59    Procedures Procedures (including critical care time)  Medications Ordered in ED Medications  enoxaparin (LOVENOX) injection 40 mg (40 mg Subcutaneous Given 05/30/16 2143)  sodium chloride flush (NS) 0.9 % injection 3 mL (3 mLs Intravenous Given 05/30/16 2143)  acetaminophen (TYLENOL) tablet 650 mg (not administered)    Or  acetaminophen (TYLENOL) suppository 650 mg (not administered)  aspirin EC tablet 81 mg (81 mg Oral Given 05/30/16 2143)     Initial Impression / Assessment and Plan / ED Course  I have reviewed the triage vital signs and the nursing notes.  Pertinent labs & imaging results that were available during my care of the patient were reviewed by me and considered in my medical decision making (see chart for details).  Clinical Course    Patient is a 70 year old right-handed male who presents with left-sided numbness and weakness. Could start initially prior to arrival. Airway intact on arrival.  Glucose normal. Patient has mild left hemiparesis on exam and reports decreased sensation on the left side, though he feels his symptoms have improved slightly. Patient was taken to the CT scanner. Noncontrast head CT does not reveal acute bleed. Patient was evaluated by neurology and diagnosed with acute ischemic stroke. Patient is out of the window for TPA. Recommend admission for stroke workup and treatment. Admitted to family medicine teaching service.  Patient seen and discussed with Dr. Vanita Panda, ED attending  Final Clinical Impressions(s) / ED Diagnoses   Final diagnoses:  Left-sided weakness    New Prescriptions There are no discharge medications for this patient.    Gibson Ramp, MD 05/31/16 [id number]    Carmin Muskrat, MD 06/01/16 [phone]

## 2016-05-30 NOTE — Consult Note (Signed)
Neurology Consult Note  Reason for Consultation: CODE STROKE  Requesting provider: Carmin Muskrat, MD  CC: numbness on L side  HPI: This is a 70 year old right-handed man who presents to the [facility] emergency department for evaluation of left-sided numbness. History is obtained directly from the patient who is an excellent historian.  He reports that he was in his usual state of health until about noon today. He says that he was doing some routine housework and was fixing himself some lunch. At 11:30, he put something in the oven and set the timer for 30 minutes. At 12:00, when the timer went off, he tried to get up to go get his food and he fell to the floor because his left leg gave out. He states that his left leg felt numb. However, he was able to get up by himself and walk to the kitchen to prepare his lunch and feed his dog. He then states that he went to sit down in the chair to watch the news and when he got up he again fell to the ground because his left leg gave out. This occurred a total of 4 times. In addition, at one point, he tried to grab his drink with his left hand and states that he drink fell through his fingers because he could not hold onto it. He also had an episode where he was trying to pick up the remote control with his left hand and again had difficulty hodling on to it. Eventually, he noted numbness in the left arm and left leg. He also had some numbness around the left jaw and neck. He does not endorse any actual weakness, just numbness. He denies any lightheadedness or dizziness but says that he felt woozy when he fell. Currently, he says that his symptoms have improved but he still has some decreased sensation of the left side of his body. He denies any double vision, vision loss, slurred speech, difficulty swallowing, or right-sided deficits.  He denies any history of stroke or TIA. He has not had any recent injury to the head or neck. He does not take daily  antiplatelet therapy. He states that he has no history of hypertension or high cholesterol.  Last known well: 11:30 NHISS score: 3 mRS score: 0 tPA given?: No, patient is outside of the window   PMH:  Past Medical History:  Diagnosis Date  . Cataract    eye implants  . ED (erectile dysfunction)   . Nephrolithiasis   . Prostate CA ([place]) 08/06/09   recurrent  . Wears dentures    upper    PSH:  Past Surgical History:  Procedure Laterality Date  . ROBOT ASSISTED LAPAROSCOPIC RADICAL PROSTATECTOMY     10/06/09    Family history: Family History  Problem Relation Age of Onset  . Cancer Mother     breast  . Kidney failure Father     Social history:  Social History   Social History  . Marital status: Single    Spouse name: N/A  . Number of children: N/A  . Years of education: N/A   Occupational History  .      Professional Pension scheme manager   Social History Main Topics  . Smoking status: Former Smoker    Quit date: 05/31/1979  . Smokeless tobacco: Never Used  . Alcohol use Yes     Comment: 4 alcoholic beverages/week  . Drug use: No  . Sexual activity: Not on file   Other Topics  Concern  . Not on file   Social History Narrative  . No narrative on file    Allergies: No Known Allergies  ROS: As per HPI. A full 14-point review of systems was performed and is otherwise unremarkable.   PE:  BP 174/80 (BP Location: Right Arm)   Pulse 78   Temp 98.4 F (36.9 C) (Oral)   Resp 21   Ht 5\' 5"  (1.651 m)   Wt 78.3 kg (172 lb 9 oz)   SpO2 98%   BMI 28.72 kg/m   General: WDWN, no acute distress. AAO x4. Speech clear, no dysarthria. No aphasia. Follows commands briskly. Affect is bright with congruent mood. Comportment is normal.  HEENT: Normocephalic. Neck supple without LAD. MMM, OP clear. Dentition good. Sclerae anicteric. No conjunctival injection.  CV: Regular, no murmur. Carotid pulses full and symmetric, no bruits. Distal pulses 2+ and  symmetric.  Lungs: CTAB.  Abdomen: Soft, non-distended, non-tender. Bowel sounds present x4.  Extremities: No C/C/E. Neuro:  CN: Pupils are equal and round. They are symmetrically reactive from 3-->2 mm. EOMI notable for breakup of smooth pursuits in all directions without nystagmus. No reported diplopia. Facial sensation is intact to light touch and pinprick. Face is symmetric at rest with normal strength and mobility. Hearing is intact to conversational voice. Palate elevates symmetrically and uvula is midline. Voice is normal in tone, pitch and quality. Bilateral SCM and trapezii are 5/5. Tongue is midline with normal bulk and mobility.  Motor: Normal bulk, tone, and strength with the exception of 4/5 left grip, left finger extensors, left wrist extensors, left triceps, left knee flexion and left ankle dorsiflexion. No tremor or other abnormal movements. No drift.  Sensation: Decreased to light touch and pinprick on the left side, worse in the leg than the arm. Vibration is reduced to the level of the knee in both lower extremity is. Joint position is mildly impaired in both big toes.  DTRs: 2+, symmetric with the exception of absent ankle jerks. Toes downgoing on the right, upgoing on the left. Coordination: Finger-to-nose and heel-to-shin are without dysmetric on the left, with greater dysmetria in the upper extremity. Finger taps are slower on the left than the right.   Labs:  Lab Results  Component Value Date   WBC 5.1 05/30/2016   HGB 16.3 05/30/2016   HCT 48.0 05/30/2016   PLT 133 (L) 05/30/2016   GLUCOSE 123 (H) 05/30/2016   ALT 18 05/30/2016   AST 24 05/30/2016   NA 141 05/30/2016   K 3.7 05/30/2016   CL 103 05/30/2016   CREATININE 1.20 05/30/2016   BUN 15 05/30/2016   CO2 25 05/30/2016   Troponin 0.00  Imaging:  I have personally and independently reviewed the CT scan of the head without contrast from today. This shows age-appropriate atrophy. There is atherosclerotic  calcification of the internal carotid arteries bilaterally.   Assessment and Plan:  1. Acute Ischemic Stroke:  There are no known risk factors for cerebrovascular disease in this patient apart from his age. Additional workup will be ordered to include MRI brain, MRA of the head, TTE, fasting lipids, and hemoglobin a1c .Further testing will be determined by results from these initial studies. Recommend antiplatelet therapy with aspirin 81 mg daily for secondary stroke prevention once cleared to take oral medications. Initiate statin with goal LDL less than 70. Ensure adequate glucose control. Allow permissive hypertension in the acute phase, treating only SBP greater than 220 mmHg and/or DBP greater than  110 mmHg. Avoid fever and hyperglycemia as these can extend the infarct. Avoid hypotonic IVF to minimize exacerbation of post-stroke edema. Initiate rehab services. DVT prophylaxis as needed.   2. Left hemiparesis: This is acute, due to stroke. PT, OT, rehabilitation.  3. Left hemisensory loss: This is acute, due to stroke. PT, OT, rehabilitation. 4. Left dysmetria: This is acute, due to stroke. PT, OT, rehabilitation.  This was discussed with the patient and his fiance. They're in agreement with the plan as stated. There are given the opportunity to ask questions and these were addressed to their satisfaction.   I discussed my findings and impression with the ED attending, Dr. Vanita Panda, at the time of consultation.  Thank you for this consult. Neurology will continue to follow and the stroke team will assume care of the patient beginning 05/31/16.

## 2016-05-30 NOTE — ED Notes (Signed)
Initial NIH Score: 3 Last Neuro Check: 2000; next due at 2200 No change in neuro status Swallow Screen: passed Ambulation status: standby assist [place]. RN @ [phone]

## 2016-05-30 NOTE — ED Notes (Signed)
Admitting providers at bedside at this time.

## 2016-05-30 NOTE — ED Triage Notes (Addendum)
Pt. Was at home and went into the kitchen to make lunch @ 11:30 After 30 minutes the timer went off and when he got up to go to the kitchen he fell @ 12:00.   He got back up and felt dizzy  Went to the kitchen and prepared himself a sandwich.  AT that time his lt. leg felt numb. After eating he rested for about 45 minutes  He got thirsty, he got back up to go get a drink and he fell again.  He picked himself up and when he went to the fridge and grabbed a drink, he was unable to hold the drink and it fell out of his hand.  Pt. Drank another drink and then went out to let the puippies out and when pt. Walked back in , he fell again.   When he bent down to pick off the leaves off the puppies ears he fell again.  Pt. Denies any injuries from the falls. Pt. Arrived to the bridge airway intact, speech clear GCS 15.  Pt. Continues to have numbness to his lt. Face, arm and leg.  Transferred directly to the [place].

## 2016-05-30 NOTE — H&P (Signed)
[facility] Admission History and Physical Service Pager: [phone]  Patient name: Billy Fuller Medical record number: [id number] Date of birth: 1946-06-28 Age: 70 y.o. Gender: male  Primary Care Provider: No PCP Per Patient Consultants: Neuro Code Status: Full  Chief Complaint: L-sided weakness, numbness  Assessment and Plan: Billy Fuller is a 70 y.o. male presenting with Left arm and leg weakness and paresthesias . PMH is significant for prostate cancer, s/p prostatectomy  # New onset L-sided weakness with paresthesias, likely ischemic stroke: Presentation with sudden onset left sided numbness and weakness with dysmetria consistent with new stroke.  CT head negative for acute intracranial process. Patient presented outside window for tPA. Other causes of unilateral numbness/paresthesias include malignancy given the patient's history of prostate cancer, however this is less likely with negative CT head. Infectious process also less likely given afebrile, no leukocytosis, differential WNL. Vital signs stable. 4/5 strength on L upper and lower extremities on initial physical exam, however neuro exam otherwise WNL.  - Admit to [place] Attending Dr. Ree Kida - MRI head pending - monitor on telemetry - AM EKG - q2h neuro checks - Risk stratify with HbA1c, lipid panel - continuous pulse ox, O2 by  to keep saturation >92% - start ASA 81 mg - Neurology consulted, appreciate recs  # History prostate cancer: Patient s/p prostatectomy 9 years ago, endorses being cancer-free for the past 4-5 years.  Follows every 6 months with his oncologist. - no home medications  FEN/GI: cardiac diet Prophylaxis: lovenox  Disposition: admit to FPTS, telemetry  History of Present Illness:  Billy Fuller is a 70 y.o. male presenting with left arm and left leg weakness and numbness.  The patient was in his usual state of health until today when he endorses 4 falls to the left side with  notable weakness in his left leg.  The patient states that he put something in the oven at 11:30 and set a 30 minute timer. Went into his office and sat down. When the timer went off at noon, he tried to stand up but fell over to the left. He thought his left foot was asleep. Says it felt like his left leg was numb and "gave out." Was able to get up and walk into the kitchen. Made a sandwich and fed dogs. Then sat down to watch the news. After sitting for 30 - 45 minutes, he tried to stand up again, but fell a second time. Was able to walk to the kitchen again. Got a drink which then fell out of his left hand. Says his hand began to feel numb at this point. Went outside with his dogs, then fell again when he came back inside. Subsequently fell a fourth time when trying to pick leaves out of his dog's fur. Fell to the L side each time. Reports worsening numbness and weakness on the L throughout the day. Called his fiancee after his fourth fall, who called EMS.  Patient denies blurry vision, slurred speech, or facial droop. Endorsed some L jaw numbness in addition to L-sided extremity numbness. Does endorse L-sided tingling. Denies SOB, no palpitations. Denies SOB, N/V/C/D. Denies increased frequency, dysuria. Denies HA.   In the ED, code stroke was initiated. Patient was outside the window for tPa. Neurology was called.  CT head was performed, negative for hemorrhagic stroke. MRI brain was ordered and decision was made to admit patient to [facility].  Review Of Systems: Per HPI with the following additions:   Review of  Systems  Constitutional: Negative for chills, diaphoresis and fever.  Eyes: Negative for blurred vision.  Respiratory: Negative for shortness of breath.   Cardiovascular: Negative for chest pain and palpitations.  Gastrointestinal: Negative for abdominal pain, constipation, diarrhea, heartburn, nausea and vomiting.  Genitourinary: Negative for dysuria, frequency and urgency.  Musculoskeletal:  Positive for falls.  Neurological: Positive for tingling, sensory change, focal weakness and weakness. Negative for dizziness, speech change, loss of consciousness and headaches.    Patient Active Problem List   Diagnosis Date Noted  . Impacted cerumen of right ear 05/01/2016  . Prostate cancer ([place]) 05/16/2011  . Prostate CA ([place]) 08/06/2009    Past Medical History: Past Medical History:  Diagnosis Date  . Cataract    eye implants  . ED (erectile dysfunction)   . Nephrolithiasis   . Prostate CA ([place]) 08/06/09   recurrent  . Wears dentures    upper    Past Surgical History: Past Surgical History:  Procedure Laterality Date  . ROBOT ASSISTED LAPAROSCOPIC RADICAL PROSTATECTOMY     10/06/09    Social History: Social History  Substance Use Topics  . Smoking status: Former Smoker    Quit date: 05/31/1979  . Smokeless tobacco: Never Used  . Alcohol use Yes     Comment: 4 alcoholic beverages/week   Additional social history: Lives at home with his fiancee of 10 years. Started smoking around age 40, stopped in 87. Smoked 1ppd at most.   Please also refer to relevant sections of EMR.  Family History: Family History  Problem Relation Age of Onset  . Cancer Mother     breast  . Kidney failure Father    Father - Prostate cancer  Allergies and Medications: No Known Allergies No current facility-administered medications on file prior to encounter.    No current outpatient prescriptions on file prior to encounter.    Objective: BP 135/79   Pulse 74   Temp 98.1 F (36.7 C)   Resp 15   Ht 5\' 5"  (1.651 m)   Wt 78.3 kg (172 lb 9 oz)   SpO2 96%   BMI 28.72 kg/m  Exam: General: NAD, pleasant gentleman rests comfortably in bed in no apparent distress Eyes: PERRL, EOMI, no scleral icterus, no conjunctival pallor or injection ENTM: no rhinorrhea or congestion, no pharyngeal erythema or exudate Neck: full ROM, no thyromegaly, no lymphadenopathy Cardiovascular: RRR, no  m/r/g Respiratory: CTA bil, no W/R/R Gastrointestinal: soft, nontender, nondistended, normoactive BS, no hepatosplenomegaly MSK: full ROM in 4 extremities, see neuro exam Derm: No rashes or lesions Neuro: CN II-XII grossly intact + L dysmetria, +4/5 LUE and LLE strength, 5/5 RUE, RLE strength, no dysarthria Psych: AAOx3, thoughtprocess linear, affect appropriate  Labs and Imaging: CBC BMET   Recent Labs Lab 05/30/16 1744 05/30/16 1753  WBC 5.1  --   HGB 16.6 16.3  HCT 45.5 48.0  PLT 133*  --     Recent Labs Lab 05/30/16 1744 05/30/16 1753  NA 139 141  K 3.7 3.7  CL 105 103  CO2 25  --   BUN 13 15  CREATININE 1.17 1.20  GLUCOSE 124* 123*  CALCIUM 9.6  --      EXAM: CT HEAD WITHOUT CONTRAST (05/30/2016) Brain: No midline shift, ventriculomegaly, mass effect, evidence of mass lesion, intracranial hemorrhage or evidence of cortically based acute infarction. Gray-white matter differentiation is within normal limits throughout the brain. No cortical encephalomalacia identified. Vascular: Calcified atherosclerosis at the skull base. No suspicious intracranial  vascular hyperdensity. Skull: No acute osseous abnormality identified. Sinuses/Orbits: Visualized paranasal sinuses and mastoids are stable and well pneumatized. Incidental bilateral EAC hearing aids. Other: No acute orbit or scalp soft tissue finding. ASPECTS [facility] Stroke Program Early CT Score) - Ganglionic level infarction (caudate, lentiform nuclei, internal capsule, insula, M1-M3 cortex): 7 - Supraganglionic infarction (M4-M6 cortex): 3 Total score (0-10 with 10 being normal): 10 IMPRESSION: 1. Normal noncontrast CT appearance of the brain. 2. ASPECTS is 10  Everrett Coombe, MD 05/30/2016, 7:41 PM PGY-1, [place] Intern pager: [phone], text pages welcome  UPPER LEVEL ADDENDUM  I have read the above note and made revisions highlighted in orange.  Adin Hector, MD,  MPH PGY-2 [place] [facility] Pager [phone]

## 2016-05-31 ENCOUNTER — Observation Stay (HOSPITAL_COMMUNITY): Payer: Medicare Other

## 2016-05-31 DIAGNOSIS — G8194 Hemiplegia, unspecified affecting left nondominant side: Secondary | ICD-10-CM | POA: Diagnosis present

## 2016-05-31 DIAGNOSIS — I6789 Other cerebrovascular disease: Secondary | ICD-10-CM | POA: Diagnosis not present

## 2016-05-31 DIAGNOSIS — I639 Cerebral infarction, unspecified: Secondary | ICD-10-CM

## 2016-05-31 DIAGNOSIS — R531 Weakness: Secondary | ICD-10-CM

## 2016-05-31 DIAGNOSIS — Z87891 Personal history of nicotine dependence: Secondary | ICD-10-CM | POA: Diagnosis not present

## 2016-05-31 DIAGNOSIS — G458 Other transient cerebral ischemic attacks and related syndromes: Secondary | ICD-10-CM

## 2016-05-31 DIAGNOSIS — E782 Mixed hyperlipidemia: Secondary | ICD-10-CM | POA: Diagnosis present

## 2016-05-31 DIAGNOSIS — Z8546 Personal history of malignant neoplasm of prostate: Secondary | ICD-10-CM | POA: Diagnosis not present

## 2016-05-31 LAB — CBC
HCT: 44 % (ref 39.0–52.0)
Hemoglobin: 15.5 g/dL (ref 13.0–17.0)
MCH: 31.8 pg (ref 26.0–34.0)
MCHC: 35.2 g/dL (ref 30.0–36.0)
MCV: 90.2 fL (ref 78.0–100.0)
Platelets: 126 10*3/uL — ABNORMAL LOW (ref 150–400)
RBC: 4.88 MIL/uL (ref 4.22–5.81)
RDW: 12.9 % (ref 11.5–15.5)
WBC: 4.6 10*3/uL (ref 4.0–10.5)

## 2016-05-31 LAB — LIPID PANEL
CHOLESTEROL: 213 mg/dL — AB (ref 0–200)
HDL: 36 mg/dL — AB (ref >40)
LDL Cholesterol: 138 mg/dL — ABNORMAL HIGH (ref 0–99)
TRIGLYCERIDES: 193 mg/dL — AB (ref <150)
Total CHOL/HDL Ratio: 5.9 RATIO
VLDL: 39 mg/dL (ref 0–40)

## 2016-05-31 LAB — COMPREHENSIVE METABOLIC PANEL
ALBUMIN: 3.6 g/dL (ref 3.5–5.0)
ALK PHOS: 53 U/L (ref 38–126)
ALT: 15 U/L — ABNORMAL LOW (ref 17–63)
ANION GAP: 9 (ref 5–15)
AST: 21 U/L (ref 15–41)
BILIRUBIN TOTAL: 0.6 mg/dL (ref 0.3–1.2)
BUN: 12 mg/dL (ref 6–20)
CALCIUM: 8.9 mg/dL (ref 8.9–10.3)
CO2: 22 mmol/L (ref 22–32)
Chloride: 107 mmol/L (ref 101–111)
Creatinine, Ser: 1.02 mg/dL (ref 0.61–1.24)
GFR calc Af Amer: >60 mL/min (ref >60)
GLUCOSE: 110 mg/dL — AB (ref 65–99)
POTASSIUM: 3.8 mmol/L (ref 3.5–5.1)
Sodium: 138 mmol/L (ref 135–145)
TOTAL PROTEIN: 5.8 g/dL — AB (ref 6.5–8.1)

## 2016-05-31 MED ORDER — ASPIRIN 325 MG PO TABS
325.0000 mg | ORAL_TABLET | Freq: Every day | ORAL | Status: DC
  Administered 2016-05-31 – 2016-06-01 (×2): 325 mg via ORAL
  Filled 2016-05-31 (×2): qty 1

## 2016-05-31 MED ORDER — ATORVASTATIN CALCIUM 40 MG PO TABS
40.0000 mg | ORAL_TABLET | Freq: Every day | ORAL | Status: DC
  Administered 2016-05-31 – 2016-06-01 (×2): 40 mg via ORAL
  Filled 2016-05-31 (×2): qty 1

## 2016-05-31 NOTE — Progress Notes (Signed)
Family Medicine Teaching Service Daily Progress Note Intern Pager: [phone]  Patient name: Billy Fuller Medical record number: [id number] Date of birth: Oct 27, 1945 Age: 70 y.o. Gender: male  Primary Care Provider: No PCP Per Patient Consultants: Neurology Code Status: Full  Pt Overview and Major Events to Date:  11/28: admit for stroke rule out 11/29: MRI head significant for new infarct of R thalamus measuring 1 cm  Assessment and Plan: Jolin Milbert is a 70 y.o. male presenting with Left arm and leg weakness and paresthesias . PMH is significant for prostate cancer, s/p prostatectomy  #Left-Sided Weakness and Paresthesia, Acute, Stable: Presentation with sudden onset left sided numbness and weakness with dysmetria consistent with new stroke.  CT head negative for acute intracranial process. MRI pertinent for new ischemic R-thalamic stroke measuring 1 cm. Patient presented outside window for tPA. Other causes of unilateral numbness/paresthesias include malignancy given the patient's history of prostate cancer, however this is less likely. Infectious process also less likely given afebrile, no leukocytosis, differential WNL. Vital signs stable. 4/5 strength on L upper and lower extremities on initial physical exam, RUE and RLE unremarkable. No CN deficits and no fine motor deficit. --Neurology consulted, appreciate recs --Telemetry --AM EKG pending --Risk stratify with HbA1c pending, lipid panel mixed hyperlipidemia --Continuous pulse ox, O2 by [place] to keep saturation >92% --ASA 81 mg  #History prostate cancer: Patient s/p prostatectomy 9 years ago, endorses being cancer-free for the past 4-5 years.  Follows every 6 months with his oncologist. --No home medications  FEN/GI: Cardiac diet Prophylaxis: Lovenox SQ  Disposition: Pending stroke work up and resolution of L-sided weakness.  Subjective:  Patient laying in bed comfortable accompanied by wife after returning from MRI. Says he  continues to have left sided weakness at upper and lower extremity. Denies new deficits. Understands he has new ischemic stroke and agreeable to need for work-up.  Objective: Temp:  [97.8 F (36.6 C)-98.4 F (36.9 C)] 97.8 F (36.6 C) (11/29 0657) Pulse Rate:  [61-78] 73 (11/29 0657) Resp:  [14-24] 16 (11/29 0657) BP: (104-174)/(62-85) 118/75 (11/29 0657) SpO2:  [94 %-99 %] 99 % (11/29 0657) Weight:  [171 lb (77.6 kg)-172 lb 9 oz (78.3 kg)] 171 lb (77.6 kg) (11/28 2044) Physical Exam: General: well nourished, well developed, in no acute distress with non-toxic appearance HEENT: normocephalic, atraumatic, moist mucous membranes Neck: supple, non-tender without lymphadenopathy CV: regular rate and rhythm without murmurs, rubs, or gallops Lungs: clear to auscultation bilaterally with normal work of breathing Abdomen: soft, non-tender, no masses or organomegaly palpable, normoactive bowel sounds Skin: warm, dry, no rashes or lesions, cap refill < 2 seconds Extremities: warm and well perfused, normal tone Neuro: CN II-XII intact, fine motor intact, no slurring of speech, gross LUE and LLE 4/5 weakness with paresthesia at left hand and foot  Laboratory: A1c: pending Troponin: neg  Lab Results  Component Value Date   CHOL 213 (H) 05/31/2016   HDL 36 (L) 05/31/2016   LDLCALC 138 (H) 05/31/2016   TRIG 193 (H) 05/31/2016   CHOLHDL 5.9 05/31/2016    Recent Labs Lab 05/30/16 1744 05/30/16 1753 05/31/16 0454  WBC 5.1  --  4.6  HGB 16.6 16.3 15.5  HCT 45.5 48.0 44.0  PLT 133*  --  126*    Recent Labs Lab 05/30/16 1744 05/30/16 1753 05/31/16 0454  NA 139 141 138  K 3.7 3.7 3.8  CL 105 103 107  CO2 25  --  22  BUN 13 15 12  CREATININE 1.17 1.20 1.02  CALCIUM 9.6  --  8.9  PROT 6.3*  --  5.8*  BILITOT 0.7  --  0.6  ALKPHOS 59  --  53  ALT 18  --  15*  AST 24  --  21  GLUCOSE 124* 123* 110*   Imaging/Diagnostic Tests: MR Brain Wo Contrast  (05/31/2016) FINDINGS: Brain: Acute infarct right thalamus measuring 1 cm. No other acute infarct. Mild chronic microvascular ischemic changes in the white matter. Pons and cerebellum normal. Negative for hemorrhage or mass. Ventricle size is normal. Cerebral volume is normal. Vascular: Normal arterial flow voids. Skull and upper cervical spine: Negative Sinuses/Orbits: Mild mucosal edema paranasal sinuses. Bilateral lens replacement. Other: None  IMPRESSION: 1 cm acute infarct right thalamus. Mild chronic microvascular ischemia in the white matter.  CT Head Code Stroke W/O CM (05/30/2016) FINDINGS: Brain: No midline shift, ventriculomegaly, mass effect, evidence of mass lesion, intracranial hemorrhage or evidence of cortically based acute infarction. Gray-white matter differentiation is within normal limits throughout the brain. No cortical encephalomalacia identified. Vascular: Calcified atherosclerosis at the skull base. No suspicious intracranial vascular hyperdensity. Skull: No acute osseous abnormality identified. Sinuses/Orbits: Visualized paranasal sinuses and mastoids are stable and well pneumatized. Incidental bilateral EAC hearing aids. Other: No acute orbit or scalp soft tissue finding. ASPECTS [facility] Stroke Program Early CT Score) - Ganglionic level infarction (caudate, lentiform nuclei, internal capsule, insula, M1-M3 cortex): 7 - Supraganglionic infarction (M4-M6 cortex): 3 Total score (0-10 with 10 being normal): 10  IMPRESSION: 1. Normal noncontrast CT appearance of the brain. 2. ASPECTS is 10. 3. The above was relayed via text pager to Dr. Milus Banister on 05/30/2016 at 17:59 .    Sells Bing, DO 05/31/2016, 8:39 AM PGY-1, [place] Intern pager: [phone], text pages welcome

## 2016-05-31 NOTE — Evaluation (Signed)
Speech Language Pathology Evaluation Patient Details Name: Billy Fuller MRN: [id number] DOB: 09/18/1945 Today's Date: 05/31/2016 Time: [id number] SLP Time Calculation (min) (ACUTE ONLY): 24 min  Problem List:  Patient Active Problem List   Diagnosis Date Noted  . Left-sided weakness 05/30/2016  . TIA (transient ischemic attack) 05/30/2016  . Impacted cerumen of right ear 05/01/2016  . Prostate cancer ([place]) 05/16/2011  . Prostate CA ([place]) 08/06/2009   Past Medical History:  Past Medical History:  Diagnosis Date  . Cataract    eye implants  . ED (erectile dysfunction)   . Nephrolithiasis   . Prostate CA ([place]) 08/06/09   recurrent  . Wears dentures    upper   Past Surgical History:  Past Surgical History:  Procedure Laterality Date  . ROBOT ASSISTED LAPAROSCOPIC RADICAL PROSTATECTOMY     10/06/09   HPI:  70 y.o.malepresenting with Left arm and leg weakness and paresthesias. MRI shows acute infarct R thalamus. PMH is significant for prostate cancer, s/p prostatectomy   Assessment / Plan / Recommendation Clinical Impression  Pt scored a 25 on the MoCA (26 and above considered to be WNL), with mild difficulty observed during delayed recall and during complex tasks, felt to be in part related to reduced working memory and complex comprehension. Pt reports that this is baseline for him. He is retired and lives at home with his fiancee. No acute SLP needed at this time. Pt would benefit from initial 24/7 supervision upon return home.    SLP Assessment  Patient does not need any further Speech Lanaguage Pathology Services    Follow Up Recommendations  24 hour supervision/assistance;Other (comment) (OP SLP if having difficulty upon return home)    Frequency and Duration           SLP Evaluation Cognition  Overall Cognitive Status: History of cognitive impairments - at baseline Orientation Level: Oriented X4       Comprehension  Auditory Comprehension Overall Auditory  Comprehension: Impaired Commands: Impaired Complex Commands: 50-74% accurate Interfering Components: Working Curator: Within Function Limits    Expression Expression Primary Mode of Expression: Verbal Verbal Expression Overall Verbal Expression: Appears within functional limits for tasks assessed Written Expression Dominant Hand: Right   Oral / Motor  Motor Speech Overall Motor Speech: Appears within functional limits for tasks assessed   GO          Functional Assessment Tool Used: skilled clinical judgment Functional Limitations: Memory Memory Current Status [id number]): At least 20 percent but less than 40 percent impaired, limited or restricted Memory Goal Status [id number]): At least 20 percent but less than 40 percent impaired, limited or restricted Memory Discharge Status [phone]): At least 20 percent but less than 40 percent impaired, limited or restricted         Germain Osgood 05/31/2016, 11:59 AM  Germain Osgood, M.A. CCC-SLP [phone]

## 2016-05-31 NOTE — Progress Notes (Signed)
STROKE TEAM PROGRESS NOTE   HISTORY OF PRESENT ILLNESS (per record) This is a 70 year old right-handed man who presents to the [facility] emergency department for evaluation of left-sided numbness. History is obtained directly from the patient who is an excellent historian.  He reports that he was in his usual state of health until about noon today. He says that he was doing some routine housework and was fixing himself some lunch. At 11:30, he put something in the oven and set the timer for 30 minutes. At 12:00, when the timer went off, he tried to get up to go get his food and he fell to the floor because his left leg gave out. He states that his left leg felt numb. However, he was able to get up by himself and walk to the kitchen to prepare his lunch and feed his dog. He then states that he went to sit down in the chair to watch the news and when he got up he again fell to the ground because his left leg gave out. This occurred a total of 4 times. In addition, at one point, he tried to grab his drink with his left hand and states that he drink fell through his fingers because he could not hold onto it. He also had an episode where he was trying to pick up the remote control with his left hand and again had difficulty hodling on to it. Eventually, he noted numbness in the left arm and left leg. He also had some numbness around the left jaw and neck. He does not endorse any actual weakness, just numbness. He denies any lightheadedness or dizziness but says that he felt woozy when he fell. Currently, he says that his symptoms have improved but he still has some decreased sensation of the left side of his body. He denies any double vision, vision loss, slurred speech, difficulty swallowing, or right-sided deficits.  He denies any history of stroke or TIA. He has not had any recent injury to the head or neck. He does not take daily antiplatelet therapy. He states that he has no history of hypertension or high  cholesterol.   SUBJECTIVE (INTERVAL HISTORY) Continues to have some left sided weakness. Vitals stable overnight.    OBJECTIVE Temp:  [97.7 F (36.5 C)-98.4 F (36.9 C)] 98.2 F (36.8 C) (11/29 1422) Pulse Rate:  [61-83] 83 (11/29 1422) Cardiac Rhythm: Normal sinus rhythm (11/29 0726) Resp:  [14-24] 20 (11/29 1422) BP: (104-174)/(62-85) 149/82 (11/29 1422) SpO2:  [94 %-99 %] 99 % (11/29 1422) Weight:  [171 lb (77.6 kg)-172 lb 9 oz (78.3 kg)] 171 lb (77.6 kg) (11/28 2044)  CBC:  Recent Labs Lab 05/30/16 1744 05/30/16 1753 05/31/16 0454  WBC 5.1  --  4.6  NEUTROABS 2.9  --   --   HGB 16.6 16.3 15.5  HCT 45.5 48.0 44.0  MCV 90.1  --  90.2  PLT 133*  --  126*    Basic Metabolic Panel:  Recent Labs Lab 05/30/16 1744 05/30/16 1753 05/31/16 0454  NA 139 141 138  K 3.7 3.7 3.8  CL 105 103 107  CO2 25  --  22  GLUCOSE 124* 123* 110*  BUN 13 15 12   CREATININE 1.17 1.20 1.02  CALCIUM 9.6  --  8.9    Lipid Panel:    Component Value Date/Time   CHOL 213 (H) 05/31/2016 0454   TRIG 193 (H) 05/31/2016 0454   HDL 36 (L) 05/31/2016 0454  CHOLHDL 5.9 05/31/2016 0454   VLDL 39 05/31/2016 0454   LDLCALC 138 (H) 05/31/2016 0454   HgbA1c: No results found for: HGBA1C Urine Drug Screen: No results found for: LABOPIA, COCAINSCRNUR, LABBENZ, AMPHETMU, THCU, LABBARB    IMAGING  Mr Brain Wo Contrast (neuro Protocol)  Result Date: 05/31/2016 CLINICAL DATA:  Left-sided weakness and paresthesia.  Stroke. EXAM: MRI HEAD WITHOUT CONTRAST TECHNIQUE: Multiplanar, multiecho pulse sequences of the brain and surrounding structures were obtained without intravenous contrast. COMPARISON:  CT head 05/30/2016 FINDINGS: Brain: Acute infarct right thalamus measuring 1 cm. No other acute infarct. Mild chronic microvascular ischemic changes in the white matter. Pons and cerebellum normal. Negative for hemorrhage or mass. Ventricle size is normal. Cerebral volume is normal. Vascular: Normal  arterial flow voids. Skull and upper cervical spine: Negative Sinuses/Orbits: Mild mucosal edema paranasal sinuses. Bilateral lens replacement. Other: None IMPRESSION: 1 cm acute infarct right thalamus. Mild chronic microvascular ischemia in the white matter. Electronically Signed   By: Franchot Gallo M.D.   On: 05/31/2016 08:23   Ct Head Code Stroke W/o Cm  Result Date: 05/30/2016 CLINICAL DATA:  Code stroke. 70 year old male left lower extremity weakness. Last seen normal levin 30 hours. Initial encounter. EXAM: CT HEAD WITHOUT CONTRAST TECHNIQUE: Contiguous axial images were obtained from the base of the skull through the vertex without intravenous contrast. COMPARISON:  PET-CT 12/18/2012. FINDINGS: Brain: No midline shift, ventriculomegaly, mass effect, evidence of mass lesion, intracranial hemorrhage or evidence of cortically based acute infarction. Gray-white matter differentiation is within normal limits throughout the brain. No cortical encephalomalacia identified. Vascular: Calcified atherosclerosis at the skull base. No suspicious intracranial vascular hyperdensity. Skull: No acute osseous abnormality identified. Sinuses/Orbits: Visualized paranasal sinuses and mastoids are stable and well pneumatized. Incidental bilateral EAC hearing aids. Other: No acute orbit or scalp soft tissue finding. ASPECTS [facility] Stroke Program Early CT Score) - Ganglionic level infarction (caudate, lentiform nuclei, internal capsule, insula, M1-M3 cortex): 7 - Supraganglionic infarction (M4-M6 cortex): 3 Total score (0-10 with 10 being normal): 10 IMPRESSION: 1. Normal noncontrast CT appearance of the brain. 2. ASPECTS is 10. 3. The above was relayed via text pager to Dr. Milus Banister on 05/30/2016 at 17:59 . Electronically Signed   By: Genevie Ann M.D.   On: 05/30/2016 17:59   PHYSICAL EXAM General: NAD, axox4. No dysarthria, no aphasia. Follows commands. HEENT: /at Cv: rrr, no mr/g Lung: ctab Neurological Exam ; :   Awake alert oriented 3 with normal speech and language function. Pupils equal reactive fundi were not visualized. Vision acuity and fields seem adequate. Extraocular moments are full range without nystagmus.. Facial sensation intact. No drooping. Motor system exam revealed mild   left arm and leg weakness 4/5. Right side is 5/5. Sensation intact on both arms and legs but subjective paresthesias in the left foot and left hand.. Normal finger to nose exam. Gait deferred  ASSESSMENT/PLAN Billy Fuller is a 70 y.o. male with history of prostate CA presenting with left sided numbness and weakness.  Acute lacunar infarct of right thalamus due to small vessel disease source  Resultant  Left sided weakness and numbness  MRI  1 cm acute infarct of right thalamus  MRA  Not done  Carotid Doppler  pending  2D Echo  pending  LDL 138  HgbA1c pending  lovenox for VTE prophylaxis  Diet Heart Room service appropriate? Yes; Fluid consistency: Thin  aspirin 81 mg daily prior to admission, now on aspirin 325 mg daily  Patient counseled to be compliant with his antithrombotic medications  Ongoing aggressive stroke risk factor management  Therapy recommendations:  pending  Disposition:  pending This is likely 2/2 to small vessel disease. Allow permissive HTN. Increased asa to full dose Agree with starting lipitor 40mg  daily.   Hyperlipidemia  Home meds:  none  LDL 138 goal < 70  Add lipitor 40mg  daily  Continue statin at discharge  Other Stroke Risk Factors  Advanced age, HLD.   Hospital day # 1  Dellia Nims, M.D. PGY-3 I have personally examined this patient, reviewed notes, independently viewed imaging studies, participated in medical decision making and plan of care.ROS completed by me personally and pertinent positives fully documented  I have made any additions or clarifications directly to the above note. Agree with note above. He presented with left-sided weakness  and numbness due to small right thalamic lacunar infarct likely from small vessel disease. Recommend aspirin for stroke prevention and continue ongoing stroke workup and aggressive risk factor modification. Greater than 50% time during this 35 minute visit was spent on counseling and coordination of care about stroke risk, prevention and treatment  Antony Contras, MD Medical Director [facility] Pager: [phone] 05/31/2016 4:15 PM

## 2016-05-31 NOTE — Evaluation (Signed)
Physical Therapy Evaluation Patient Details Name: Billy Fuller MRN: [id number] DOB: 1946-02-26 Today's Date: 05/31/2016   History of Present Illness  Pt admitted with L side weakness, numbness and falls. +acute lacunar infarct R thalamus. PMH: prostate cancer  Clinical Impression  Pt admitted with above diagnosis. Pt currently with functional limitations due to the deficits listed below (see PT Problem List). At the time of PT eval pt was able to perform transfers and ambulation with up to +2 mod assist for balance support and safety. Pt acknowledges when he has LOB or difficulty with a task however does not anticipate needs well and is overall unsafe to ambulate without assistance at this time. Pt will benefit from skilled PT to increase their independence and safety with mobility to allow discharge to the venue listed below.      Follow Up Recommendations Home health PT;Supervision for mobility/OOB    Equipment Recommendations  Rolling walker with 5" wheels    Recommendations for Other Services       Precautions / Restrictions Precautions Precautions: Fall      Mobility  Bed Mobility Overal bed mobility: Modified Independent                Transfers Overall transfer level: Needs assistance Equipment used: 1 person hand held assist Transfers: Sit to/from Stand Sit to Stand: Min guard;Min assist         General transfer comment: no assist to rise, steadying assist to gain/maintain balance.   Ambulation/Gait Ambulation/Gait assistance: Mod assist;+2 physical assistance Ambulation Distance (Feet): 300 Feet Assistive device: None Gait Pattern/deviations: Step-through pattern;Decreased stride length;Decreased weight shift to left;Decreased dorsiflexion - left;Trunk flexed;Antalgic Gait velocity: Decreased Gait velocity interpretation: Below normal speed for age/gender General Gait Details: Occasional assist provided for balance support and safety. Pt appears to be  rushing at times causing LOB. With higher level balance tasks +2 assist required to prevent fall.   Stairs            Wheelchair Mobility    Modified Rankin (Stroke Patients Only) Modified Rankin (Stroke Patients Only) Pre-Morbid Rankin Score: No symptoms Modified Rankin: Moderately severe disability     Balance Overall balance assessment: Needs assistance Sitting-balance support: Feet supported Sitting balance-Leahy Scale: Fair Sitting balance - Comments: pt with one LOB toward L in sitting, able to don socks without LOB   Standing balance support: No upper extremity supported Standing balance-Leahy Scale: Fair Standing balance comment: Statically                             Pertinent Vitals/Pain Pain Assessment: No/denies pain    Home Living Family/patient expects to be discharged to:: Private residence Living Arrangements: Spouse/significant other (fiance) Available Help at Discharge: Family;Available PRN/intermittently (fiance works) Type of Home: House Home Access: Stairs to enter Entrance Stairs-Rails: None Technical brewer of Steps: 3 Home Layout: One level Home Equipment: None      Prior Function Level of Independence: Independent         Comments: retired Geophysicist/field seismologist   Dominant Hand: Right    Extremity/Trunk Assessment   Upper Extremity Assessment: Defer to OT evaluation       LUE Deficits / Details: shoulder 4-/5, bicep 4/5, tricep 4-/5, forearm 4-/5, wrist 4/5, gross grasp and pinch 4-/5   Lower Extremity Assessment: LLE deficits/detail   LLE Deficits / Details: Decreased strength and coordination of L side  Cervical / Trunk  Assessment: Normal  Communication   Communication: No difficulties  Cognition Arousal/Alertness: Awake/alert Behavior During Therapy: Impulsive Overall Cognitive Status: Impaired/Different from baseline Area of Impairment: Safety/judgement          Safety/Judgement: Decreased awareness of safety          General Comments      Exercises     Assessment/Plan    PT Assessment Patient needs continued PT services  PT Problem List Decreased strength;Decreased range of motion;Decreased activity tolerance;Decreased balance;Decreased mobility;Decreased knowledge of use of DME;Decreased safety awareness;Decreased knowledge of precautions;Pain          PT Treatment Interventions DME instruction;Gait training;Stair training;Functional mobility training;Therapeutic exercise;Therapeutic activities;Neuromuscular re-education;Patient/family education    PT Goals (Current goals can be found in the Care Plan section)  Acute Rehab PT Goals Patient Stated Goal: return home to his dogs PT Goal Formulation: With patient Time For Goal Achievement: 06/07/16 Potential to Achieve Goals: Good    Frequency Min 4X/week   Barriers to discharge        Co-evaluation PT/OT/SLP Co-Evaluation/Treatment: Yes Reason for Co-Treatment: For patient/therapist safety PT goals addressed during session: Mobility/safety with mobility;Balance;Proper use of DME OT goals addressed during session: ADL's and self-care       End of Session Equipment Utilized During Treatment: Gait belt Activity Tolerance: Patient tolerated treatment well Patient left: in bed;with call bell/phone within reach;with bed alarm set Nurse Communication: Mobility status    Functional Assessment Tool Used: Clinical judgement Functional Limitation: Mobility: Walking and moving around Mobility: Walking and Moving Around Current Status [id number]): At least 60 percent but less than 80 percent impaired, limited or restricted Mobility: Walking and Moving Around Goal Status [phone]): At least 40 percent but less than 60 percent impaired, limited or restricted    Time: [id number] PT Time Calculation (min) (ACUTE ONLY): 32 min   Charges:   PT Evaluation $PT Eval Moderate Complexity: 1  Procedure     PT G Codes:   PT G-Codes **NOT FOR INPATIENT CLASS** Functional Assessment Tool Used: Clinical judgement Functional Limitation: Mobility: Walking and moving around Mobility: Walking and Moving Around Current Status [id number]): At least 60 percent but less than 80 percent impaired, limited or restricted Mobility: Walking and Moving Around Goal Status [phone]): At least 40 percent but less than 60 percent impaired, limited or restricted    Thelma Comp 05/31/2016, 3:24 PM   Rolinda Roan, PT, DPT Acute Rehabilitation Services Pager: [phone]

## 2016-05-31 NOTE — Discharge Summary (Signed)
Billy Fuller  Patient name: Billy Fuller Medical record number: [id number] Date of birth: [birth year] Age: 70 y.o. Gender: male Date of Admission: 05/30/2016  Date of Discharge: 06/01/2016 Admitting Physician: Lupita Dawn, MD  Primary Care Provider: No PCP Per Patient Consultants: Neurology  Indication for Hospitalization: Acute ischemic stroke  Discharge Diagnoses/Problem List:  Acute ischemic stroke Left-sided weakness and paresthesia History of prostate cancer  Disposition: Home with HHPT/OT  Discharge Condition: Stable, imporved  Discharge Exam:  General: well nourished, well developed, in no acute distress with non-toxic appearance HEENT: normocephalic, atraumatic, moist mucous membranes Neck: supple, non-tender without lymphadenopathy CV: regular rate and rhythm without murmurs, rubs, or gallops Lungs: clear to auscultation bilaterally with normal work of breathing Abdomen: soft, non-tender, no masses or organomegaly palpable, normoactive bowel sounds Skin: warm, dry, no rashes or lesions, cap refill < 2 seconds Extremities: warm and well perfused, normal tone Neuro: CN II-XII intact, fine motor intact, no slurring of speech, b/l UE and LE 5/5 motor strength with minimal paresthesia at left hand and foot  Brief Hospital Course:  Billy Fuller a 70 y.o.malewho p/w with new onset L arm and leg weakness and paresthesias. PMH is significant for prostate cancer, s/p prostatectomy.  Patient noted on 11/28 to have L sided weakness to both his leg and and arm when he experienced 4 falls and could not grab hold of a glass. He also noted numbness to his L extremities. Patient did not endorse visual or speech changes, facial drooping, or CN deficits. He was brought by EMS to the ED as a code stroke. Patient was outside of tPA window. Initially CT head neg for hemorrhage. MRI brain noted a new infarct of R thalamus measuring 1 cm.  Vitals remained stable during admission. His motor strength improved rapidly over the course of 2 days to near baseline. Risk stratification labs indicated need for mod intensity statin. ASA was also initiated. Neurology believe etiology was 2/2 to small vessel dz. ECHO and carotid dopplers were performed prior to d/c with PCP and neuro f/u on 11/30.  Issues for Follow Up:  1. Continue ASA 325 mg QD for stroke prevention given acute ischemic stroke. 2. ASCVD score 26.6%, benefit mod-high intensity statin. Patient started on Atorvastatin 40 mg QD. Patient is to continue this on discharge. 3. Neurology will f/u acute ischemic stroke in 6 weeks. 4. F/u ECHO and carotid doppler findings. 5. Continue f/u with oncologist for h/o prostate cancer. 6. PT/OT rec HHPT/OT with rolling walker for ambulatory assistance and bedside 3 in 1 commode.   Significant Procedures: None  Significant Labs and Imaging:  A1c: 5.4 Troponin: neg Lab Results  Component Value Date   CHOL 213 (H) 05/31/2016   HDL 36 (L) 05/31/2016   LDLCALC 138 (H) 05/31/2016   TRIG 193 (H) 05/31/2016   CHOLHDL 5.9 05/31/2016    Recent Labs Lab 05/30/16 1744 05/30/16 1753 05/31/16 0454 06/01/16 0400  WBC 5.1  --  4.6 4.9  HGB 16.6 16.3 15.5 15.5  HCT 45.5 48.0 44.0 44.4  PLT 133*  --  126* 129*    Recent Labs Lab 05/30/16 1744 05/30/16 1753 05/31/16 0454 06/01/16 0400  NA 139 141 138 137  K 3.7 3.7 3.8 4.1  CL 105 103 107 105  CO2 25  --  22 24  GLUCOSE 124* 123* 110* 115*  BUN 13 15 12 13   CREATININE 1.17 1.20 1.02 1.02  CALCIUM 9.6  --  8.9  8.8*  ALKPHOS 59  --  53  --   AST 24  --  21  --   ALT 18  --  15*  --   ALBUMIN 4.2  --  3.6  --    Imaging/Diagnostic Tests: Transthoracic Echocardiography (06/01/2016) Study Conclusions - Left ventricle: The cavity size was normal. There was mild   concentric hypertrophy. Systolic function was vigorous. The   estimated ejection fraction was in the range of 65% to  70%. Wall   motion was normal; there were no regional wall motion   abnormalities. Doppler parameters are consistent with abnormal   left ventricular relaxation (grade 1 diastolic dysfunction).   There was no evidence of elevated ventricular filling pressure by   Doppler parameters. - Aortic valve: Structurally normal valve. There was no   regurgitation. - Ascending aorta: The ascending aorta was normal in size. - Mitral valve: Structurally normal valve. Transvalvular velocity   was within the normal range. There was no evidence for stenosis.   There was no regurgitation. - Left atrium: The atrium was normal in size. - Right ventricle: The cavity size was normal. Wall thickness was   normal. Systolic function was normal. - Right atrium: The atrium was normal in size. - Tricuspid valve: There was no regurgitation. - Pulmonic valve: There was no regurgitation. - Pericardium, extracardiac: The pericardium was normal in   appearance.  MR Brain Wo Contrast (05/31/2016) FINDINGS: Brain: Acute infarct right thalamus measuring 1 cm. No other acute infarct. Mild chronic microvascular ischemic changes in the white matter. Pons and cerebellum normal. Negative for hemorrhage or mass. Ventricle size is normal. Cerebral volume is normal. Vascular: Normal arterial flow voids. Skull and upper cervical spine: Negative Sinuses/Orbits: Mild mucosal edema paranasal sinuses. Bilateral lens replacement. Other: None  IMPRESSION: 1 cm acute infarct right thalamus. Mild chronic microvascular ischemia in the white matter.  CT Head Code Stroke W/O CM (05/30/2016) FINDINGS: Brain: No midline shift, ventriculomegaly, mass effect, evidence of mass lesion, intracranial hemorrhage or evidence of cortically based acute infarction. Gray-white matter differentiation is within normal limits throughout the brain. No cortical encephalomalacia identified. Vascular: Calcified atherosclerosis at the skull base. No  suspicious intracranial vascular hyperdensity. Skull: No acute osseous abnormality identified. Sinuses/Orbits: Visualized paranasal sinuses and mastoids are stable and well pneumatized. Incidental bilateral EAC hearing aids. Other: No acute orbit or scalp soft tissue finding. ASPECTS [facility] Stroke Program Early CT Score) - Ganglionic level infarction (caudate, lentiform nuclei, internal capsule, insula, M1-M3 cortex): 7 - Supraganglionic infarction (M4-M6 cortex): 3 Total score (0-10 with 10 being normal): 10  IMPRESSION: 1. Normal noncontrast CT appearance of the brain. 2. ASPECTS is 10. 3. The above was relayed via text pager to Dr. Milus Banister on 05/30/2016 at 17:59 .  Results/Tests Pending at Time of Discharge: LE doppler U/S  Discharge Medications:    Medication List    TAKE these medications   aspirin 325 MG tablet Take 1 tablet (325 mg total) by mouth daily. Start taking on:  06/02/2016   atorvastatin 40 MG tablet Commonly known as:  LIPITOR Take 1 tablet (40 mg total) by mouth daily at 6 PM.            Durable Medical Equipment        Start     Ordered   06/01/16 1146  For home use only DME 3 n 1  Once     06/01/16 1146   06/01/16 1146  For home use only DME  Walker rolling  Once    Comments:  Needs 5" wheels per PT/OT recs  Question:  Patient needs a walker to treat with the following condition  Answer:  Acute ischemic stroke [facility])   06/01/16 1146      Discharge Instructions: Please refer to Patient Instructions section of EMR for full details.  Patient was counseled important signs and symptoms that should prompt return to medical care, changes in medications, dietary instructions, activity restrictions, and follow up appointments.   Follow-Up Appointments: Follow-up Information    SETHI,PRAMOD, MD. Schedule an appointment as soon as possible for a visit in 6 week(s).   Specialties:  Neurology, Radiology Why:  In stroke clinic Contact information: [place] [address] [place], DO 06/01/2016, 9:12 PM PGY-1, [place]

## 2016-05-31 NOTE — Evaluation (Signed)
Occupational Therapy Evaluation Patient Details Name: Billy Fuller MRN: [id number] DOB: [birth year] Today's Date: 05/31/2016    History of Present Illness Pt admitted with L side weakness, numbness and falls. +acute lacunar infarct R thalamus. PMH: prostate cancer   Clinical Impression   Pt was independent prior to admission. Presents with poor balance, impulsivity, decreased awareness of safety and L UE deficits interfering with ability to perform ADL at his baseline. Pt is highly motivated to return home. Will follow acutely.    Follow Up Recommendations  Home health OT    Equipment Recommendations  3 in 1 bedside comode    Recommendations for Other Services       Precautions / Restrictions Precautions Precautions: Fall      Mobility Bed Mobility Overal bed mobility: Modified Independent                Transfers Overall transfer level: Needs assistance Equipment used: 1 person hand held assist Transfers: Sit to/from Stand Sit to Stand: Min guard;Min assist         General transfer comment: no assist to rise, steadying assist to gain/maintain balance.     Balance Overall balance assessment: Needs assistance Sitting-balance support: Feet supported Sitting balance-Leahy Scale: Fair Sitting balance - Comments: pt with one LOB toward L in sitting, able to don socks without LOB   Standing balance support: No upper extremity supported Standing balance-Leahy Scale: Fair Standing balance comment: Statically                            ADL Overall ADL's : Needs assistance/impaired Eating/Feeding: Set up;Sitting   Grooming: Minimal assistance;Standing   Upper Body Bathing: Minimal assitance;Sitting   Lower Body Bathing: Minimal assistance;Sit to/from stand   Upper Body Dressing : Minimal assistance;Sitting Upper Body Dressing Details (indicate cue type and reason): tied front opening gown Lower Body Dressing: Minimal assistance;Sit to/from  stand Lower Body Dressing Details (indicate cue type and reason): able to don socks Toilet Transfer: Moderate assistance;Ambulation   Toileting- Clothing Manipulation and Hygiene: Minimal assistance;Sit to/from stand       Functional mobility during ADLs: +2 for physical assistance;Minimal assistance (without a device)       Vision     Perception     Praxis      Pertinent Vitals/Pain Pain Assessment: No/denies pain     Hand Dominance Right   Extremity/Trunk Assessment Upper Extremity Assessment Upper Extremity Assessment: Defer to OT evaluation LUE Deficits / Details: shoulder 4-/5, bicep 4/5, tricep 4-/5, forearm 4-/5, wrist 4/5, gross grasp and pinch 4-/5 LUE Sensation: decreased proprioception (dulled temperature and pain) LUE Coordination: decreased fine motor;decreased gross motor   Lower Extremity Assessment Lower Extremity Assessment: LLE deficits/detail LLE Deficits / Details: Decreased strength and coordination of L side LLE Sensation: history of peripheral neuropathy   Cervical / Trunk Assessment Cervical / Trunk Assessment: Normal   Communication Communication Communication: No difficulties   Cognition Arousal/Alertness: Awake/alert Behavior During Therapy: Impulsive Overall Cognitive Status: Impaired/Different from baseline Area of Impairment: Safety/judgement         Safety/Judgement: Decreased awareness of safety         General Comments       Exercises       Shoulder Instructions      Home Living Family/patient expects to be discharged to:: Private residence Living Arrangements: Spouse/significant other (fiance) Available Help at Discharge: Family;Available PRN/intermittently (fiance works) Type of Home: House Home Access: Stairs  to enter Entrance Stairs-Number of Steps: 3 Entrance Stairs-Rails: None Home Layout: One level     Bathroom Shower/Tub: Walk-in shower;Tub/shower unit (pt uses the tub shower)   Bathroom Toilet:  Standard Bathroom Accessibility: Yes   Home Equipment: None      Lives With: Significant other    Prior Functioning/Environment Level of Independence: Independent        Comments: retired Brewing technologist Problem List: Decreased strength;Decreased activity tolerance;Impaired balance (sitting and/or standing);Decreased coordination;Decreased safety awareness;Decreased knowledge of use of DME or AE;Impaired UE functional use   OT Treatment/Interventions: Self-care/ADL training;Neuromuscular education;DME and/or AE instruction;Therapeutic activities;Patient/family education;Balance training    OT Goals(Current goals can be found in the care plan section) Acute Rehab OT Goals Patient Stated Goal: return home to his dogs OT Goal Formulation: With patient Time For Goal Achievement: 06/14/16 Potential to Achieve Goals: Good ADL Goals Pt Will Perform Grooming: with supervision;standing Pt Will Perform Lower Body Bathing: with supervision;sit to/from stand Pt Will Perform Lower Body Dressing: with supervision;sit to/from stand Pt Will Transfer to Toilet: with supervision;ambulating;regular height toilet Pt Will Perform Toileting - Clothing Manipulation and hygiene: with supervision;sit to/from stand Pt Will Perform Tub/Shower Transfer: Tub transfer;ambulating;with min guard assist;3 in 1;rolling walker Pt/caregiver will Perform Home Exercise Program: Left upper extremity;With theraband;With theraputty;Independently  OT Frequency: Min 3X/week   Barriers to D/C:            Co-evaluation PT/OT/SLP Co-Evaluation/Treatment: Yes Reason for Co-Treatment: For patient/therapist safety PT goals addressed during session: Mobility/safety with mobility;Balance;Proper use of DME OT goals addressed during session: ADL's and self-care      End of Session Equipment Utilized During Treatment: Gait belt  Activity Tolerance: Patient tolerated treatment well Patient left: in  bed;with call bell/phone within reach;with bed alarm set   Time: [id number] OT Time Calculation (min): 34 min Charges:  OT General Charges $OT Visit: 1 Procedure OT Evaluation $OT Eval Moderate Complexity: 1 Procedure G-Codes: OT G-codes **NOT FOR INPATIENT CLASS** Functional Assessment Tool Used: clinical judgement Functional Limitation: Self care Self Care Current Status [id number]): At least 20 percent but less than 40 percent impaired, limited or restricted Self Care Goal Status [id number]): At least 1 percent but less than 20 percent impaired, limited or restricted  Malka So 05/31/2016, 3:29 PM  [phone]

## 2016-06-01 ENCOUNTER — Inpatient Hospital Stay (HOSPITAL_COMMUNITY): Payer: Medicare Other

## 2016-06-01 DIAGNOSIS — R531 Weakness: Secondary | ICD-10-CM

## 2016-06-01 DIAGNOSIS — I6789 Other cerebrovascular disease: Secondary | ICD-10-CM

## 2016-06-01 LAB — BASIC METABOLIC PANEL
ANION GAP: 8 (ref 5–15)
BUN: 13 mg/dL (ref 6–20)
CALCIUM: 8.8 mg/dL — AB (ref 8.9–10.3)
CO2: 24 mmol/L (ref 22–32)
CREATININE: 1.02 mg/dL (ref 0.61–1.24)
Chloride: 105 mmol/L (ref 101–111)
GFR calc Af Amer: >60 mL/min (ref >60)
GLUCOSE: 115 mg/dL — AB (ref 65–99)
Potassium: 4.1 mmol/L (ref 3.5–5.1)
Sodium: 137 mmol/L (ref 135–145)

## 2016-06-01 LAB — HEMOGLOBIN A1C
Hgb A1c MFr Bld: 5.4 % (ref 4.8–5.6)
Mean Plasma Glucose: 108 mg/dL

## 2016-06-01 LAB — ECHOCARDIOGRAM COMPLETE
HEIGHTINCHES: 65 in
Weight: 2736 oz

## 2016-06-01 LAB — CBC
HCT: 44.4 % (ref 39.0–52.0)
Hemoglobin: 15.5 g/dL (ref 13.0–17.0)
MCH: 31.4 pg (ref 26.0–34.0)
MCHC: 34.9 g/dL (ref 30.0–36.0)
MCV: 90.1 fL (ref 78.0–100.0)
PLATELETS: 129 10*3/uL — AB (ref 150–400)
RBC: 4.93 MIL/uL (ref 4.22–5.81)
RDW: 12.9 % (ref 11.5–15.5)
WBC: 4.9 10*3/uL (ref 4.0–10.5)

## 2016-06-01 MED ORDER — ASPIRIN 325 MG PO TABS: 325.0000 mg | ORAL_TABLET | Freq: Every day | ORAL | 0 refills | Status: DC

## 2016-06-01 MED ORDER — ATORVASTATIN CALCIUM 40 MG PO TABS: 40.0000 mg | ORAL_TABLET | Freq: Every day | ORAL | 0 refills | Status: AC

## 2016-06-01 MED ORDER — PNEUMOCOCCAL VAC POLYVALENT 25 MCG/0.5ML IJ INJ: 0.5000 mL | INJECTION | INTRAMUSCULAR | Status: DC

## 2016-06-01 NOTE — Care Management Note (Signed)
Case Management Note  Patient Details  Name: Billy Fuller MRN: [id number] Date of Birth: 07-14-1945  Subjective/Objective:                    Action/Plan: Plan is for patient to discharge home with his wife and orders for [facility] services. CM met with the patient and his wife and provided them a list of [place] agencies in the [place] area. They selected [facility]. Hoyle Sauer notified and accepted the referral. Pt has walker and 3 in 1 ordered. Pt refusing the DME stating he does not need the equipment. Will update the bedside RN.   Expected Discharge Date:                  Expected Discharge Plan:  [place]  In-House Referral:     Discharge planning Services  CM Consult  Post Acute Care Choice:  Durable Medical Equipment, Home Health (Pt refused the DME) Choice offered to:  Patient, Spouse  DME Arranged:    DME Agency:     HH Arranged:  PT, OT HH Agency:  [place]  Status of Service:  Completed, signed off  If discussed at [place] of Stay Meetings, dates discussed:    Additional Comments:  Pollie Friar, RN 06/01/2016, 3:22 PM

## 2016-06-01 NOTE — Progress Notes (Signed)
STROKE TEAM PROGRESS NOTE   HISTORY OF PRESENT ILLNESS (per record) This is a 70 year old right-handed man who presents to the [facility] emergency department for evaluation of left-sided numbness. History is obtained directly from the patient who is an excellent historian.  He reports that he was in his usual state of health until about noon today. He says that he was doing some routine housework and was fixing himself some lunch. At 11:30, he put something in the oven and set the timer for 30 minutes. At 12:00, when the timer went off, he tried to get up to go get his food and he fell to the floor because his left leg gave out. He states that his left leg felt numb. However, he was able to get up by himself and walk to the kitchen to prepare his lunch and feed his dog. He then states that he went to sit down in the chair to watch the news and when he got up he again fell to the ground because his left leg gave out. This occurred a total of 4 times. In addition, at one point, he tried to grab his drink with his left hand and states that he drink fell through his fingers because he could not hold onto it. He also had an episode where he was trying to pick up the remote control with his left hand and again had difficulty hodling on to it. Eventually, he noted numbness in the left arm and left leg. He also had some numbness around the left jaw and neck. He does not endorse any actual weakness, just numbness. He denies any lightheadedness or dizziness but says that he felt woozy when he fell. Currently, he says that his symptoms have improved but he still has some decreased sensation of the left side of his body. He denies any double vision, vision loss, slurred speech, difficulty swallowing, or right-sided deficits.  He denies any history of stroke or TIA. He has not had any recent injury to the head or neck. He does not take daily antiplatelet therapy. He states that he has no history of hypertension or high  cholesterol.   SUBJECTIVE (INTERVAL HISTORY) He states his doing better and left hand grip strength as well as none numbness appear to be improving but are not gone   OBJECTIVE Temp:  [97.6 F (36.4 C)-98.6 F (37 C)] 97.9 F (36.6 C) (11/30 1432) Pulse Rate:  [69-75] 74 (11/30 1432) Cardiac Rhythm: Normal sinus rhythm (11/30 0700) Resp:  [16-20] 20 (11/30 1432) BP: (110-136)/(61-86) 125/74 (11/30 1432) SpO2:  [95 %-99 %] 99 % (11/30 1432)  CBC:  Recent Labs Lab 05/30/16 1744  05/31/16 0454 06/01/16 0400  WBC 5.1  --  4.6 4.9  NEUTROABS 2.9  --   --   --   HGB 16.6  < > 15.5 15.5  HCT 45.5  < > 44.0 44.4  MCV 90.1  --  90.2 90.1  PLT 133*  --  126* 129*  < > = values in this interval not displayed.  Basic Metabolic Panel:   Recent Labs Lab 05/31/16 0454 06/01/16 0400  NA 138 137  K 3.8 4.1  CL 107 105  CO2 22 24  GLUCOSE 110* 115*  BUN 12 13  CREATININE 1.02 1.02  CALCIUM 8.9 8.8*    Lipid Panel:     Component Value Date/Time   CHOL 213 (H) 05/31/2016 0454   TRIG 193 (H) 05/31/2016 0454   HDL 36 (L)  05/31/2016 0454   CHOLHDL 5.9 05/31/2016 0454   VLDL 39 05/31/2016 0454   LDLCALC 138 (H) 05/31/2016 0454   HgbA1c:  Lab Results  Component Value Date   HGBA1C 5.4 05/30/2016   Urine Drug Screen: No results found for: LABOPIA, COCAINSCRNUR, LABBENZ, AMPHETMU, THCU, LABBARB    IMAGING  Mr Brain Wo Contrast (neuro Protocol)  Result Date: 05/31/2016 CLINICAL DATA:  Left-sided weakness and paresthesia.  Stroke. EXAM: MRI HEAD WITHOUT CONTRAST TECHNIQUE: Multiplanar, multiecho pulse sequences of the brain and surrounding structures were obtained without intravenous contrast. COMPARISON:  CT head 05/30/2016 FINDINGS: Brain: Acute infarct right thalamus measuring 1 cm. No other acute infarct. Mild chronic microvascular ischemic changes in the white matter. Pons and cerebellum normal. Negative for hemorrhage or mass. Ventricle size is normal. Cerebral volume  is normal. Vascular: Normal arterial flow voids. Skull and upper cervical spine: Negative Sinuses/Orbits: Mild mucosal edema paranasal sinuses. Bilateral lens replacement. Other: None IMPRESSION: 1 cm acute infarct right thalamus. Mild chronic microvascular ischemia in the white matter. Electronically Signed   By: Franchot Gallo M.D.   On: 05/31/2016 08:23   Ct Head Code Stroke W/o Cm  Result Date: 05/30/2016 CLINICAL DATA:  Code stroke. 70 year old male left lower extremity weakness. Last seen normal levin 30 hours. Initial encounter. EXAM: CT HEAD WITHOUT CONTRAST TECHNIQUE: Contiguous axial images were obtained from the base of the skull through the vertex without intravenous contrast. COMPARISON:  PET-CT 12/18/2012. FINDINGS: Brain: No midline shift, ventriculomegaly, mass effect, evidence of mass lesion, intracranial hemorrhage or evidence of cortically based acute infarction. Gray-white matter differentiation is within normal limits throughout the brain. No cortical encephalomalacia identified. Vascular: Calcified atherosclerosis at the skull base. No suspicious intracranial vascular hyperdensity. Skull: No acute osseous abnormality identified. Sinuses/Orbits: Visualized paranasal sinuses and mastoids are stable and well pneumatized. Incidental bilateral EAC hearing aids. Other: No acute orbit or scalp soft tissue finding. ASPECTS [facility] Stroke Program Early CT Score) - Ganglionic level infarction (caudate, lentiform nuclei, internal capsule, insula, M1-M3 cortex): 7 - Supraganglionic infarction (M4-M6 cortex): 3 Total score (0-10 with 10 being normal): 10 IMPRESSION: 1. Normal noncontrast CT appearance of the brain. 2. ASPECTS is 10. 3. The above was relayed via text pager to Dr. Milus Banister on 05/30/2016 at 17:59 . Electronically Signed   By: Genevie Ann M.D.   On: 05/30/2016 17:59   PHYSICAL EXAM General: NAD, axox4. No dysarthria, no aphasia. Follows commands. HEENT: [place]/at Cv: rrr, no mr/g Lung:  ctab Neurological Exam ; :  Awake alert oriented 3 with normal speech and language function. Pupils equal reactive fundi were not visualized. Vision acuity and fields seem adequate. Extraocular moments are full range without nystagmus.. Facial sensation intact. No drooping. Motor system exam revealed mild   left arm and leg weakness 4/5. Right side is 5/5. Sensation intact on both arms and legs but subjective paresthesias in the left foot and left hand.. Normal finger to nose exam. Gait deferred  ASSESSMENT/PLAN Mr. Kerion Wiebelhaus is a 70 y.o. male with history of prostate CA presenting with left sided numbness and weakness.  Acute lacunar infarct of right thalamus due to small vessel disease source  Resultant  Left sided weakness and numbness  MRI  1 cm acute infarct of right thalamus  MRA  Not done  Carotid Doppler  Low end bilateral 1-39% ICA stenosis, antegrade vertebral flow  2D Echo  pending  LDL 138  HgbA1c 5.4  lovenox for VTE prophylaxis Diet Heart Room service  appropriate? Yes; Fluid consistency: Thin  aspirin 81 mg daily prior to admission, now on aspirin 325 mg daily  Patient counseled to be compliant with his antithrombotic medications  Ongoing aggressive stroke risk factor management  Therapy recommendations:  pending  Disposition:  pending This is likely 2/2 to small vessel disease. Allow permissive HTN. Increased asa to full dose Agree with starting lipitor 40mg  daily.   Hyperlipidemia  Home meds:  none  LDL 138 goal < 70  Add lipitor 40mg  daily  Continue statin at discharge  Other Stroke Risk Factors  Advanced age, HLD.   Hospital day # 2   I have personally examined this patient, reviewed notes, independently viewed imaging studies, participated in medical decision making and plan of care.ROS completed by me personally and pertinent positives fully documented  I have made any additions or clarifications directly to the above note.   He  presented with left-sided weakness and numbness due to small right thalamic lacunar infarct likely from small vessel disease. Continue aspirin for stroke prevention and continue ongoing stroke workup and aggressive risk factor modification. Greater than 50% time during this 15 minute visit was spent on counseling and coordination of care about stroke risk, prevention and treatment. Check echocardiogram and discharged home. Follow-up as an outpatient in stroke clinic in 6 weeks.  Antony Contras, MD Medical Director [facility] Pager: [phone] 06/01/2016 3:26 PM

## 2016-06-01 NOTE — Progress Notes (Signed)
*  PRELIMINARY RESULTS* Vascular Ultrasound Carotid Duplex (Doppler) has been completed.  Preliminary findings: consistent with a low end 1- 39 percent stenosis involving the right internal carotid artery and the left internal carotid artery.  Bilateral vertebral arteries are patent and antegrade.  Everrett Coombe 06/01/2016, 3:32 PM

## 2016-06-01 NOTE — Progress Notes (Signed)
Family Medicine Teaching Service Daily Progress Note Intern Pager: [phone]  Patient name: Billy Fuller Medical record number: [id number] Date of birth: [birth year] Age: 70 y.o. Gender: male  Primary Care Provider: No PCP Per Patient Consultants: Neurology Code Status: Full  Pt Overview and Major Events to Date:  11/28: Admit for stroke rule out 11/29: MRI head significant for new infarct of R thalamus measuring 1 cm  Assessment and Plan: Billy Fuller is a 70 y.o. male presenting with Left arm and leg weakness and paresthesias. PMH is significant for prostate cancer, s/p prostatectomy  #Acute Ischemic Right-Sided Thalamic Stroke w/ L-Sided Weakness, Acute, Improved: Presentation with sudden onset left sided numbness and weakness with dysmetria consistent with new stroke.  CT head negative. MRI pertinent for new ischemic R-thalamic stroke measuring 1 cm. Patient presented outside window for tPA. Other ddx of unilateral numbness/paresthesias include malignancy given the patient's h/o prostate cancer, though unlikely. Infectious process also less likely given afebrile, no leukocytosis, differential WNL. Vital signs stable. Minimal weakness on L upper and lower extremities on initial physical exam, RUE and RLE unremarkable. No CN deficits and no fine motor deficit. Neuro believe stroke is 2/2 to small vessel dz. ASCVD score 26.6%, benefit mod-high intensity statin. Allowing permissive HTN. BP remains normotensive 110-130/60-80. --Neurology consulted, appreciate recs --Telemetry --Continuous pulse ox, O2 by [place] to keep saturation >92% --ASA 325 mg QD --Atorvastatin 40 mg QD --PT/OT rec HH, rolling walker 5" wheels, and 3 in 1 bedside commode --Carotid doppler pending 11/30 --2D ECHO pending 11/30  #History prostate cancer: Patient s/p prostatectomy 9 years ago, endorses being cancer-free for the past 4-5 years.  Follows every 6 months with his oncologist. --No home medications  FEN/GI: Heart  healthy diet Prophylaxis: Lovenox SQ  Disposition: Pending stroke work up and improvement of L-sided weakness.  Subjective:  Patient was doing well working with PT. Says his strength is near baseline and he can now hold on to a can of ginger ale compared to yesterday when he could not hold a piece of paper. Hopeful to leave today. Understands we need image studies performed prior to him leaving. Denies new or worsening neurological deficits, SOB, CP, nausea, vomiting, lightheadedness.  Objective: Temp:  [97.7 F (36.5 C)-98.6 F (37 C)] 97.8 F (36.6 C) (11/30 0416) Pulse Rate:  [69-83] 69 (11/30 0416) Resp:  [16-20] 18 (11/30 0416) BP: (110-149)/(61-86) 136/86 (11/30 0416) SpO2:  [95 %-99 %] 97 % (11/30 0416) Physical Exam: General: well nourished, well developed, in no acute distress with non-toxic appearance HEENT: normocephalic, atraumatic, moist mucous membranes Neck: supple, non-tender without lymphadenopathy CV: regular rate and rhythm without murmurs, rubs, or gallops Lungs: clear to auscultation bilaterally with normal work of breathing Abdomen: soft, non-tender, no masses or organomegaly palpable, normoactive bowel sounds Skin: warm, dry, no rashes or lesions, cap refill < 2 seconds Extremities: warm and well perfused, normal tone Neuro: CN II-XII intact, fine motor intact, no slurring of speech, gross LUE and LLE minimal weakness with minimal paresthesia at left hand and foot  Laboratory: A1c: 5.4 Troponin: neg  Lab Results  Component Value Date   CHOL 213 (H) 05/31/2016   HDL 36 (L) 05/31/2016   LDLCALC 138 (H) 05/31/2016   TRIG 193 (H) 05/31/2016   CHOLHDL 5.9 05/31/2016    Recent Labs Lab 05/30/16 1744 05/30/16 1753 05/31/16 0454 06/01/16 0400  WBC 5.1  --  4.6 4.9  HGB 16.6 16.3 15.5 15.5  HCT 45.5 48.0 44.0 44.4  PLT 133*  --  126* 129*    Recent Labs Lab 05/30/16 1744 05/30/16 1753 05/31/16 0454 06/01/16 0400  NA 139 141 138 137  K 3.7 3.7  3.8 4.1  CL 105 103 107 105  CO2 25  --  22 24  BUN 13 15 12 13   CREATININE 1.17 1.20 1.02 1.02  CALCIUM 9.6  --  8.9 8.8*  PROT 6.3*  --  5.8*  --   BILITOT 0.7  --  0.6  --   ALKPHOS 59  --  53  --   ALT 18  --  15*  --   AST 24  --  21  --   GLUCOSE 124* 123* 110* 115*   Imaging/Diagnostic Tests: MR Brain Wo Contrast (05/31/2016) FINDINGS: Brain: Acute infarct right thalamus measuring 1 cm. No other acute infarct. Mild chronic microvascular ischemic changes in the white matter. Pons and cerebellum normal. Negative for hemorrhage or mass. Ventricle size is normal. Cerebral volume is normal. Vascular: Normal arterial flow voids. Skull and upper cervical spine: Negative Sinuses/Orbits: Mild mucosal edema paranasal sinuses. Bilateral lens replacement. Other: None  IMPRESSION: 1 cm acute infarct right thalamus. Mild chronic microvascular ischemia in the white matter.  CT Head Code Stroke W/O CM (05/30/2016) FINDINGS: Brain: No midline shift, ventriculomegaly, mass effect, evidence of mass lesion, intracranial hemorrhage or evidence of cortically based acute infarction. Gray-white matter differentiation is within normal limits throughout the brain. No cortical encephalomalacia identified. Vascular: Calcified atherosclerosis at the skull base. No suspicious intracranial vascular hyperdensity. Skull: No acute osseous abnormality identified. Sinuses/Orbits: Visualized paranasal sinuses and mastoids are stable and well pneumatized. Incidental bilateral EAC hearing aids. Other: No acute orbit or scalp soft tissue finding. ASPECTS [facility] Stroke Program Early CT Score) - Ganglionic level infarction (caudate, lentiform nuclei, internal capsule, insula, M1-M3 cortex): 7 - Supraganglionic infarction (M4-M6 cortex): 3 Total score (0-10 with 10 being normal): 10  IMPRESSION: 1. Normal noncontrast CT appearance of the brain. 2. ASPECTS is 10. 3. The above was relayed via text pager to Dr.  Milus Banister on 05/30/2016 at 17:59 .    Kearney Bing, DO 06/01/2016, 7:13 AM PGY-1, [place] Intern pager: [phone], text pages welcome

## 2016-06-01 NOTE — Progress Notes (Signed)
Occupational Therapy Treatment Patient Details Name: Billy Fuller MRN: [id number] DOB: October 13, 1945 Today's Date: 06/01/2016    History of present illness Pt admitted with L side weakness, numbness and falls. +acute lacunar infarct R thalamus. PMH: prostate cancer   OT comments  Pt with improved strength and sensation on L. Focus of session on educating pt in home exercise program and safety instruction for fall prevention. Pt verbalizing understanding. Pt declines tub seat or 3 in 1. Eager to complete testing and go home.  Follow Up Recommendations  Home health OT    Equipment Recommendations       Recommendations for Other Services      Precautions / Restrictions Precautions Precautions: Fall       Mobility Bed Mobility Overal bed mobility: Modified Independent                Transfers Overall transfer level: Needs assistance   Transfers: Sit to/from Stand Sit to Stand: Supervision              Balance     Sitting balance-Leahy Scale: Good       Standing balance-Leahy Scale: Fair                     ADL Overall ADL's : Needs assistance/impaired     Grooming: Supervision/safety;Standing;Oral care;Wash/dry hands;Wash/dry face           Upper Body Dressing : Set up;Sitting   Lower Body Dressing: Supervision/safety;Sit to/from stand   Toilet Transfer: Min guard;Ambulation;Regular Toilet   Toileting- Water quality scientist and Hygiene: Supervision/safety;Sit to/from Nurse, children's Details (indicate cue type and reason): instructed pt and fiance to have supervision for tub transfers for safety Functional mobility during ADLs: Min guard General ADL Comments: Gave pt fine motor coordination handout and instructed in theraputty activities. Educated in safety and fall prevention related to IADL.      Vision                     Perception     Praxis      Cognition   Behavior During Therapy: Impulsive Overall  Cognitive Status: Impaired/Different from baseline Area of Impairment: Safety/judgement          Safety/Judgement: Decreased awareness of safety          Extremity/Trunk Assessment               Exercises     Shoulder Instructions       General Comments      Pertinent Vitals/ Pain       Pain Assessment: No/denies pain  Home Living                                          Prior Functioning/Environment              Frequency  Min 3X/week        Progress Toward Goals  OT Goals(current goals can now be found in the care plan section)  Progress towards OT goals: Progressing toward goals  Acute Rehab OT Goals Patient Stated Goal: return home to his dogs OT Goal Formulation: With patient Time For Goal Achievement: 06/14/16 Potential to Achieve Goals: Good  Plan Discharge plan remains appropriate    Co-evaluation  End of Session Equipment Utilized During Treatment: Gait belt   Activity Tolerance Patient tolerated treatment well   Patient Left in bed;with call bell/phone within reach;with family/visitor present;with bed alarm set   Nurse Communication          Time: [phone] OT Time Calculation (min): 33 min  Charges: OT General Charges $OT Visit: 1 Procedure OT Treatments $Self Care/Home Management : 8-22 mins $Neuromuscular Re-education: 8-22 mins  Billy Fuller 06/01/2016, 9:31 AM  [phone]

## 2016-06-01 NOTE — Progress Notes (Signed)
Physical Therapy Treatment Patient Details Name: Billy Fuller MRN: [id number] DOB: June 24, 1946 Today's Date: 06/01/2016    History of Present Illness Pt admitted with L side weakness, numbness and falls. +acute lacunar infarct R thalamus. PMH: prostate cancer    PT Comments    Pt progressing towards physical therapy goals. Pt demonstrating improved strength and sensation in LUE, however continues to have decreased awareness of LLE during ambulation and standing balance activity. PT recommending use of RW until balance improves with HHPT. Fiance educated in safety precautions for return home. Will continue to follow.   Follow Up Recommendations  Home health PT;Supervision for mobility/OOB     Equipment Recommendations  Rolling walker with 5" wheels    Recommendations for Other Services       Precautions / Restrictions Precautions Precautions: Fall Restrictions Weight Bearing Restrictions: No    Mobility  Bed Mobility Overal bed mobility: Modified Independent                Transfers Overall transfer level: Needs assistance Equipment used: Rolling walker (2 wheeled);Straight cane;None Transfers: Sit to/from Stand Sit to Stand: Supervision         General transfer comment: no assist to rise, steadying assist to gain/maintain balance with no AD and with SPC. No assist required with RW.  Ambulation/Gait Ambulation/Gait assistance: Min assist;Min guard;Mod assist Ambulation Distance (Feet): 300 Feet Assistive device: Rolling walker (2 wheeled);Straight cane;None Gait Pattern/deviations: Step-through pattern;Decreased stride length;Decreased weight shift to left;Decreased stance time - left;Decreased dorsiflexion - left Gait velocity: Decreased Gait velocity interpretation: Below normal speed for age/gender General Gait Details: Occasional assist provided for balance support and safety with SPC and with no AD. No assist required with RW. Pt appears to be rushing at  times causing LOB. With higher level balance tasks mod assist required to prevent fall.    Stairs Stairs: Yes Stairs assistance: Supervision Stair Management: Two rails;Alternating pattern;Forwards Number of Stairs: 5    Wheelchair Mobility    Modified Rankin (Stroke Patients Only) Modified Rankin (Stroke Patients Only) Pre-Morbid Rankin Score: No symptoms Modified Rankin: Moderately severe disability     Balance Overall balance assessment: Needs assistance Sitting-balance support: Feet supported;No upper extremity supported Sitting balance-Leahy Scale: Good     Standing balance support: No upper extremity supported Standing balance-Leahy Scale: Fair                      Cognition Arousal/Alertness: Awake/alert Behavior During Therapy: Impulsive Overall Cognitive Status: Impaired/Different from baseline Area of Impairment: Safety/judgement         Safety/Judgement: Decreased awareness of safety          Exercises      General Comments        Pertinent Vitals/Pain Pain Assessment: No/denies pain    Home Living                      Prior Function            PT Goals (current goals can now be found in the care plan section) Acute Rehab PT Goals Patient Stated Goal: return home to his dogs PT Goal Formulation: With patient Time For Goal Achievement: 06/07/16 Potential to Achieve Goals: Good Progress towards PT goals: Progressing toward goals    Frequency    Min 4X/week      PT Plan Current plan remains appropriate    Co-evaluation             End  of Session Equipment Utilized During Treatment: Gait belt Activity Tolerance: Patient tolerated treatment well Patient left: with family/visitor present (Pt left in bathroom - locked PT out, fiance to supervise.)     Time: 1540-1556 PT Time Calculation (min) (ACUTE ONLY): 16 min  Charges:  $Gait Training: 8-22 mins                    G Codes:      Thelma Comp 06/05/2016, 4:27 PM   Rolinda Roan, PT, DPT Acute Rehabilitation Services Pager: [phone]

## 2016-06-01 NOTE — Discharge Instructions (Signed)
Discharge Date: 06/01/2016  Reason for Hospitalization:   You were admitted to the hospital with stroke as a cause for your L-sided weakness.  Please follow-up with neurologist and PCP as scheduled.  If you cannot make this appointment and need to reschedule, please call ahead of time.   New medications: aspirin and lipitor  Please take medications as prescribed.  Please seek medical attention if you have any recurring weakness, slurred speech, chest pain, shortness of breath, or any other concerning symptoms.  Thank you for letting us participate in your care!           Ischemic Stroke An ischemic stroke is the sudden death of brain tissue. Blood carries oxygen to all areas of the body. This type of stroke happens when your blood does not flow to your brain like normal. Your brain cannot get the oxygen it needs. This is an emergency. It must be treated right away. Symptoms of a stroke usually happen all of a sudden. You may notice them when you wake up. They can include:  Weakness or loss of feeling in your face, arm, or leg. This often happens on one side of the body.  Trouble walking.  Trouble moving your arms or legs.  Loss of balance or coordination.  Feeling confused.  Trouble talking or understanding what people are saying.  Slurred speech.  Trouble seeing.  Seeing two of one object (double vision).  Feeling dizzy.  Feeling sick to your stomach (nauseous) and throwing up (vomiting).  A very bad headache for no reason. Get help as soon as any of these problems start. This is important. Some treatments work better if they are given right away. These include:  Aspirin.  Medicines to control blood pressure.  A shot (injection) of medicine to break up the blood clot.  Treatments given in the blood vessel (artery) to take out the clot or break it up. Other treatments may include:  Oxygen.  Fluids given through an IV tube.  Medicines to thin out  your blood.  Procedures to help your blood flow better. What increases the risk? Certain things may make you more likely to have a stroke. Some of these are things that you can change, such as:  Being very overweight (obesity).  Smoking.  Taking birth control pills.  Not being active.  Drinking too much alcohol.  Using drugs. Other risk factors include:  High blood pressure.  High cholesterol.  Diabetes.  Heart disease.  Being [place] American, Native American, Hispanic, or [place] Native.  Being over age 27.  Family history of stroke.  Having had blood clots, stroke, or warning stroke (transient ischemic attack, TIA) in the past.  Sickle cell disease.  Being a woman with a history of high blood pressure in pregnancy (preeclampsia).  Migraine headache.  Sleep apnea.  Having an irregular heartbeat (atrial fibrillation).  Long-term (chronic) diseases that cause soreness and swelling (inflammation).  Disorders that affect how your blood clots. Follow these instructions at home: Medicines  Take over-the-counter and prescription medicines only as told by your doctor.  If you were told to take aspirin or another medicine to thin your blood, take it exactly as told by your doctor.  Taking too much of the medicine can cause bleeding.  If you do not take enough, it may not work as well.  Know the side effects of your medicines. If you are taking a blood thinner, make sure you:  Hold pressure over any cuts for longer than usual.  Tell your dentist and other doctors that you take this medicine.  Avoid activities that may cause damage or injury to your body. Eating and drinking  Follow instructions from your doctor about what you cannot eat or drink.  Eat healthy foods.  If you have trouble with swallowing, do these things to avoid choking:  Take small bites when eating.  Eat foods that are soft or pureed. Safety  Follow instructions from your health  care team about physical activity.  Use a walker or cane as told by your doctor.  Keep your home safe so you do not fall. This may include:  Having experts look at your home to make sure it is safe.  Putting grab bars in the bedroom and bathroom.  Using raised toilets.  Putting a seat in the shower. General instructions  Do not use any tobacco products.  Examples of these are cigarettes, chewing tobacco, and e-cigarettes.  If you need help quitting, ask your doctor.  Limit how much alcohol you drink. This means no more than 1 drink a day for nonpregnant women and 2 drinks a day for men. One drink equals 12 oz of beer, 5 oz of wine, or 1 oz of hard liquor.  If you need help to stop using drugs or alcohol, ask your doctor to refer you to a program or specialist.  Stay active. Exercise as told by your doctor.  Keep all follow-up visits as told by your doctor. This is important. Get help right away if:  You suddenly:  Have weakness or loss of feeling in your face, arm, or leg.  Feel confused.  Have trouble talking or understanding what people are saying.  Have trouble seeing.  Have trouble walking.  Have trouble moving your arms or legs.  Feel dizzy.  Lose your balance or coordination.  Have a very bad headache and you do not know why.  You pass out (lose consciousness) or almost pass out.  You have jerky movements that you cannot control (seizure). These symptoms may be an emergency. Do not wait to see if the symptoms will go away. Get medical help right away. Call your local emergency services (911 in the [place] Do not drive yourself to the hospital.  This information is not intended to replace advice given to you by your health care provider. Make sure you discuss any questions you have with your health care provider. Document Released: 06/08/2011 Document Revised: 11/30/2015 Document Reviewed: 09/15/2015 Elsevier Interactive Patient Education  2017 [employer].

## 2016-06-01 NOTE — Progress Notes (Signed)
*  PRELIMINARY RESULTS* Vascular Ultrasound Carotid Duplex (Doppler) has been completed.  Preliminary findings: Low end bilateral 1-39% ICA stenosis, antegrade vertebral flow.   Everrett Coombe 06/01/2016, 12:46 PM

## 2016-06-01 NOTE — Progress Notes (Signed)
Pt. With discharge orders for home. Discharge instructions completed with patient and fiance verbalizing understanding. Patient wheeled out via wheelchair.

## 2016-06-02 LAB — VAS US CAROTID
LCCADDIAS: -22 cm/s
LCCADSYS: -78 cm/s
LEFT ECA DIAS: -10 cm/s
LEFT VERTEBRAL DIAS: -15 cm/s
LICADSYS: 42 cm/s
Left CCA prox dias: 17 cm/s
Left CCA prox sys: 75 cm/s
Left ICA dist dias: 9 cm/s
Left ICA prox dias: -17 cm/s
Left ICA prox sys: -62 cm/s
RCCADSYS: 42 cm/s
RCCAPSYS: 70 cm/s
RIGHT ECA DIAS: -12 cm/s
RIGHT VERTEBRAL DIAS: -16 cm/s
Right CCA prox dias: 16 cm/s

## 2016-06-05 ENCOUNTER — Other Ambulatory Visit: Payer: Self-pay

## 2016-06-05 NOTE — Patient Outreach (Signed)
[place] [facility]) Care Management  06/05/2016  Sheryl Jung 07/05/1945 [id number]  REFERRAL DATE: 06/05/16 REFERRAL SOURCE: EMMI stroke REFERRAL REASON: EMMI stroke red referraL.  Day #1, Scheduled follow up:  "NO"  Telephone outreach to patient regarding EMMI stroke red referral. Unable to reach patient. HIPAA compliant voice message left with call back phone number.   PLAN:  RNCM will attempt telephone call to patient within  1 week.  Quinn Plowman RN,BSN,CCM [facility] Telephonic  [phone]

## 2016-06-06 ENCOUNTER — Other Ambulatory Visit: Payer: Self-pay

## 2016-06-06 NOTE — Patient Outreach (Deleted)
[place] [facility]) Care Management  06/06/2016  Redd Rende [birth year] [id number]  Late entry for 06/05/16 Telephone call to patient regarding EMMI stroke referral. Unable to reach. HIPAA compliant voice message left with call back phone number.  PLAN;RNCM will attempt 2nd telephone outreach within  1 week.   Quinn Plowman RN,BSN,CCM [facility] Telephonic  [phone]

## 2016-06-06 NOTE — Patient Outreach (Addendum)
[facility]) Care Management  06/06/2016  Billy Fuller 03/23/1946 [id number]  REFERRAL DATE: 06/06/16  REFERRAL SOURCE: EMMI stroke red alert REFERRAL REASON: day #1 Scheduled follow up appointment: NO CONSENT: self/ patient  SUBJECTIVE: Telephone call to patient regarding EMMI stroke referral. HIPAA verified with patient. Patient states he has been doing very well since the stroke. Patient states he does not have home health because he felt he did not need it. Patient states he is doing his exercises as advised. Patient reports a small amount of tingling in his left hand and leg.  Denies using any medical equipment for ambulation. Patient states he has scheduled his follow up appointment with his primary MD, Dr. Audley Hose for  06/13/16. Patient states he tried to schedule an appointment with the neurologist but states he was told they had not heard of the provider.  RNCM offered to call and schedule patients follow up with neurologist. Patient verbally agreed.  Patient states overall he feels he is doing well. RNCM reviewed signs/symptoms of stroke with patient. Advised patient to call 911 for stroke like symptoms. RNCM discussed with patient how to contact his primary MD after hours. Advised to call for any other symptoms/ concerns.    RNCM called Dr. Clydene Fake office and spoke with [name].  Scheduled patient appointment with Dr. Leonie Man for August 03, 2016 at 3:00pm. Patients arrival time is at 2:30pm.  Request patient bring insurance card, photo identification, and list of medications. [name] stated she would patient on a cancellation list for a sooner appointment. RNCM called patient and gave appointment time, date and instructions as given by Dr. Clydene Fake office. Patient expressed appreciation for setting up appointment  ASSESSMENT; 70 y.o.malewho p/w with new onset L arm and leg weakness and paresthesias. PMH is significant for prostate cancer, s/p prostatectomy. Admitted  to hospital on 05/30/16 and discharged on 06/01/16. Patient currently self managing care. Patient agrees to 1 more follow up call with RNCM.   PLAN; RNCM will follow up with patient within 1 week.  RNCM will send patient EMMI stroke information on signs of stroke, cholesterol and lifestyle, and dietary fats explained.  Quinn Plowman RN,BSN,CCM [facility] Telephonic  [phone]

## 2016-06-12 DIAGNOSIS — G8194 Hemiplegia, unspecified affecting left nondominant side: Secondary | ICD-10-CM | POA: Diagnosis not present

## 2016-06-12 DIAGNOSIS — I517 Cardiomegaly: Secondary | ICD-10-CM | POA: Diagnosis not present

## 2016-06-12 DIAGNOSIS — I6529 Occlusion and stenosis of unspecified carotid artery: Secondary | ICD-10-CM | POA: Diagnosis not present

## 2016-06-12 DIAGNOSIS — I639 Cerebral infarction, unspecified: Secondary | ICD-10-CM | POA: Diagnosis not present

## 2016-06-12 DIAGNOSIS — Z8546 Personal history of malignant neoplasm of prostate: Secondary | ICD-10-CM | POA: Diagnosis not present

## 2016-06-12 DIAGNOSIS — E785 Hyperlipidemia, unspecified: Secondary | ICD-10-CM | POA: Diagnosis not present

## 2016-06-12 DIAGNOSIS — Z6829 Body mass index (BMI) 29.0-29.9, adult: Secondary | ICD-10-CM | POA: Diagnosis not present

## 2016-06-13 ENCOUNTER — Other Ambulatory Visit: Payer: Self-pay

## 2016-06-13 NOTE — Patient Outreach (Signed)
[place] [facility]) Care Management  06/13/2016  Elkan Lessman [birth year] [id number]   REFERRAL DATE: 06/06/16     REFERRAL SOURCE: EMMI stroke transition program REFERRAL REASON: EMMI stroke program follow up CONSENT: self/ patient  Telephone call to patient regarding EMMI stroke program follow up. Unable to reach patient or leave voice message. Message states voice mail box not set up.  PLAN; RNCM will attempt 2nd telephone outreach to patient within 1 week.  Quinn Plowman RN,BSN,CCM [facility] Telephonic  [phone]

## 2016-06-14 ENCOUNTER — Other Ambulatory Visit: Payer: Self-pay

## 2016-06-14 NOTE — Patient Outreach (Signed)
[place] [facility]) Care Management  06/14/2016  Billy Fuller [birth year] [id number]   REFERRAL DATE: 06/06/16     REFERRAL SOURCE: EMMI stroke red alert REFERRAL REASON: EMMI stroke telephone follow up CONSENT: self/ patient  Second EMMI stroke telephone outreach to patient. Unable to reach patient or leave voice message. Message states patients voice mail box has not been set up.   PLAN; RNCM will attempt 3rd telephone outreach to patient within 1 week.   Quinn Plowman RN,BSN,CCM [facility] Telephonic  [phone]

## 2016-06-16 ENCOUNTER — Other Ambulatory Visit: Payer: Self-pay

## 2016-06-16 NOTE — Patient Outreach (Signed)
[facility]) Care Management  06/16/2016  Billy Fuller 10-Feb-1946 [id number]   REFERRAL DATE: 06/06/16     REFERRAL SOURCE: EMMI stroke referral REFERRAL REASON:EMMI stroke follow up CONSENT: self/ patient  SUBJECTIVE: Telephone call to patient for EMMI stroke program follow up. HIPAA verified with patient. Patient states he is doing excellent. Patient denies any symptoms or concerns. Patient states he had his follow up appointment with his primary MD this week. States appointment went well. States he was not able to have his lab work drawn for his cholesterol because he had not been on the cholesterol medication for at least 30 days. States he will have his cholesterol checked at his next scheduled follow up appointment with his doctor.  Patient reports the tingling he was having in his left hand and leg has resolved.  RNCM reviewed signs/ symptoms of stroke with patient. Advised to call 911 for stroke like symptoms.  RNCM advised patient to keep follow up appointments with doctor and continue to take his medications as prescribed. Patient verbalized understanding and agreement.  Patient denies any further needs and verbalizes agreement with closure to EMMI stroke program.   PLAN; RNCM will refer patient to case manager to close due to meeting goals.

## 2016-06-29 ENCOUNTER — Other Ambulatory Visit: Payer: Self-pay | Admitting: Obstetrics and Gynecology

## 2016-07-13 DIAGNOSIS — Z79899 Other long term (current) drug therapy: Secondary | ICD-10-CM | POA: Diagnosis not present

## 2016-07-13 DIAGNOSIS — E785 Hyperlipidemia, unspecified: Secondary | ICD-10-CM | POA: Diagnosis not present

## 2016-08-03 ENCOUNTER — Encounter: Payer: Self-pay | Admitting: Neurology

## 2016-08-03 ENCOUNTER — Ambulatory Visit (INDEPENDENT_AMBULATORY_CARE_PROVIDER_SITE_OTHER): Payer: PPO | Admitting: Neurology

## 2016-08-03 VITALS — BP 113/75 | HR 74 | Ht 65.0 in | Wt 169.0 lb

## 2016-08-03 DIAGNOSIS — I639 Cerebral infarction, unspecified: Secondary | ICD-10-CM | POA: Diagnosis not present

## 2016-08-03 DIAGNOSIS — I6322 Cerebral infarction due to unspecified occlusion or stenosis of basilar arteries: Secondary | ICD-10-CM | POA: Diagnosis not present

## 2016-08-03 DIAGNOSIS — I6381 Other cerebral infarction due to occlusion or stenosis of small artery: Secondary | ICD-10-CM

## 2016-08-03 DIAGNOSIS — I63211 Cerebral infarction due to unspecified occlusion or stenosis of right vertebral arteries: Secondary | ICD-10-CM

## 2016-08-03 NOTE — Progress Notes (Signed)
[facility] [address] [place]. [place] [address] [phone]       OFFICE FOLLOW-UP NOTE  Billy Fuller Date of Birth:  05/04/1946 Medical Record Number:  [id number]   HPI: [name] is a 34 year Caucasian male seen today for first office follow-up visit following hospital admission for stroke in November 2017. History is obtain from the patient and review of hospital records. 71 year old right-handed man who presented to the [facility] emergency department for evaluation of left-sided numbness.  He reported that he was in his usual state of health until about noon today. He says that he was doing some routine housework and was fixing himself some lunch. At 11:30, he put something in the oven and set the timer for 30 minutes. At 12:00, when the timer went off, he tried to get up to go get his food and he fell to the floor because his left leg gave out. He states that his left leg felt numb. However, he was able to get up by himself and walk to the kitchen to prepare his lunch and feedhis dog. He then states that he went to sit down in the chair to watch the news and when he got up he again fell to the ground because his left leg gave out. This occurred a total of 4 times. In addition, at one point, he tried to grab his drink with his left hand and states that he drink fell through his fingers because he could not hold onto it. He also had an episode where he was trying to pick up the remote control with his left hand and again had difficulty hodlingon to it.Eventually, he noted numbness in the left arm and left leg. He also had some numbness around the left jaw and neck. He does not endorse any actual weakness, just numbness. He denies any lightheadedness or dizziness but says that he felt woozy when he fell. Currently, he says that his symptoms have improved but he still has some decreased sensation of the left side of his body. He denies any double vision, vision loss,  slurred speech, difficulty swallowing, or right-sided deficits. He denied any history of stroke or TIA. He has not had any recent injury to the head or neck. He does not take daily antiplatelet therapy. He stated that he had no history of hypertension or high cholesterol. He was admitted to the hospital for stroke workup. MRI scan confirms small right thalamic infarct. MRA of the brain showed no large vessel intracranial stenosis. Carotid ultrasound showed no significant neck second stenosis. Transversely echo showed normal ejection fraction. LDL cholesterol is elevated at 138. Hemoglobin A1c was 5.4. Patient was started on aspirin for stroke prevention and Lipitor. He states is tolerating both medications well without bleeding, bruising or muscle aches or pains. He has made full recovery and has no complaints of deficits. He is quite active and works out in [employer] every day. He is also watching his diet and is trying to lose weight.    ROS:   14 system review of systems is positive for no complaints today and all systems negative  PMH:  Past Medical History:  Diagnosis Date  . Cataract    eye implants  . ED (erectile dysfunction)   . Nephrolithiasis   . Prostate CA ([place]) 08/06/09   recurrent  . Stroke ([place])   . Wears dentures    upper    Social History:  Social History   Social  History  . Marital status: Single    Spouse name: N/A  . Number of children: N/A  . Years of education: N/A   Occupational History  .      Professional Pension scheme manager   Social History Main Topics  . Smoking status: Former Smoker    Quit date: 05/31/1979  . Smokeless tobacco: Never Used  . Alcohol use Yes     Comment: 4 alcoholic beverages/week  . Drug use: No  . Sexual activity: Not on file   Other Topics Concern  . Not on file   Social History Narrative  . No narrative on file    Medications:   Current Outpatient Prescriptions on File Prior to Visit  Medication Sig  Dispense Refill  . aspirin 325 MG tablet Take 1 tablet (325 mg total) by mouth daily. 30 tablet 0  . atorvastatin (LIPITOR) 40 MG tablet Take 1 tablet (40 mg total) by mouth daily at 6 PM. 30 tablet 0   No current facility-administered medications on file prior to visit.     Allergies:  No Known Allergies  Physical Exam General: well developed, well nourished, seated, in no evident distress Head: head normocephalic and atraumatic.  Neck: supple with no carotid or supraclavicular bruits Cardiovascular: regular rate and rhythm, no murmurs Musculoskeletal: no deformity Skin:  no rash/petichiae Vascular:  Normal pulses all extremities Vitals:   08/03/16 1509  BP: 113/75  Pulse: 74   Neurologic Exam Mental Status: Awake and fully alert. Oriented to place and time. Recent and remote memory intact. Attention span, concentration and fund of knowledge appropriate. Mood and affect appropriate.  Cranial Nerves: Fundoscopic exam reveals sharp disc margins. Pupils equal, briskly reactive to light. Extraocular movements full without nystagmus. Visual fields full to confrontation. Hearing intact. Facial sensation intact. Face, tongue, palate moves normally and symmetrically.  Motor: Normal bulk and tone. Normal strength in all tested extremity muscles. Sensory.: intact to touch ,pinprick .position and vibratory sensation.  Coordination: Rapid alternating movements normal in all extremities. Finger-to-nose and heel-to-shin performed accurately bilaterally. Gait and Station: Arises from chair without difficulty. Stance is normal. Gait demonstrates normal stride length and balance . Able to heel, toe and tandem walk without difficulty.  Reflexes: 1+ and symmetric. Toes downgoing.   NIHSS  0 Modified Rankin  0  ASSESSMENT: 47 year patient male with right thalamic infarct in November 2017 from small vessel disease with excellent clinical recovery. Vascular risk factors of age and hyperlipidemia  only    PLAN:  I had a long d/w patient about his recent lacunar stroke, risk for recurrent stroke/TIAs, personally independently reviewed imaging studies and stroke evaluation results and answered questions.Continue aspirin 325 mg daily  for secondary stroke prevention and maintain strict control of hypertension with blood pressure goal below 130/90, and lipids with LDL cholesterol goal below 70 mg/dL. I also advised the patient to eat a healthy diet with plenty of whole grains, cereals, fruits and vegetables, exercise regularly and maintain ideal body weight Followup in the future with my nurse practitioner in 6 months or call earlier if necessary Greater than 50% of time during this 25 minute visit was spent on counseling,explanation of diagnosis, planning of further management, discussion with patient and family and coordination of care Antony Contras, MD  [facility] [address] [place], [place] [address]  Phone [phone] Fax [phone] Note: This document was prepared with digital dictation and possible smart phrase technology. Any transcriptional errors that result from this  process are unintentional

## 2016-08-03 NOTE — Patient Instructions (Signed)
I had a long d/w patient about his recent lacunar stroke, risk for recurrent stroke/TIAs, personally independently reviewed imaging studies and stroke evaluation results and answered questions.Continue aspirin 325 mg daily  for secondary stroke prevention and maintain strict control of hypertension with blood pressure goal below 130/90, and lipids with LDL cholesterol goal below 70 mg/dL. I also advised the patient to eat a healthy diet with plenty of whole grains, cereals, fruits and vegetables, exercise regularly and maintain ideal body weight Followup in the future with my nurse practitioner in 6 months or call earlier if necessary  Stroke Prevention Some medical conditions and behaviors are associated with an increased chance of having a stroke. You may prevent a stroke by making healthy choices and managing medical conditions. How can I reduce my risk of having a stroke?  Stay physically active. Get at least 30 minutes of activity on most or all days.  Do not smoke. It may also be helpful to avoid exposure to secondhand smoke.  Limit alcohol use. Moderate alcohol use is considered to be:  No more than 2 drinks per day for men.  No more than 1 drink per day for nonpregnant women.  Eat healthy foods. This involves:  Eating 5 or more servings of fruits and vegetables a day.  Making dietary changes that address high blood pressure (hypertension), high cholesterol, diabetes, or obesity.  Manage your cholesterol levels.  Making food choices that are high in fiber and low in saturated fat, trans fat, and cholesterol may control cholesterol levels.  Take any prescribed medicines to control cholesterol as directed by your health care provider.  Manage your diabetes.  Controlling your carbohydrate and sugar intake is recommended to manage diabetes.  Take any prescribed medicines to control diabetes as directed by your health care provider.  Control your hypertension.  Making food  choices that are low in salt (sodium), saturated fat, trans fat, and cholesterol is recommended to manage hypertension.  Ask your health care provider if you need treatment to lower your blood pressure. Take any prescribed medicines to control hypertension as directed by your health care provider.  If you are 39-67 years of age, have your blood pressure checked every 3-5 years. If you are 40 years of age or older, have your blood pressure checked every year.  Maintain a healthy weight.  Reducing calorie intake and making food choices that are low in sodium, saturated fat, trans fat, and cholesterol are recommended to manage weight.  Stop drug abuse.  Avoid taking birth control pills.  Talk to your health care provider about the risks of taking birth control pills if you are over 62 years old, smoke, get migraines, or have ever had a blood clot.  Get evaluated for sleep disorders (sleep apnea).  Talk to your health care provider about getting a sleep evaluation if you snore a lot or have excessive sleepiness.  Take medicines only as directed by your health care provider.  For some people, aspirin or blood thinners (anticoagulants) are helpful in reducing the risk of forming abnormal blood clots that can lead to stroke. If you have the irregular heart rhythm of atrial fibrillation, you should be on a blood thinner unless there is a good reason you cannot take them.  Understand all your medicine instructions.  Make sure that other conditions (such as anemia or atherosclerosis) are addressed. Get help right away if:  You have sudden weakness or numbness of the face, arm, or leg, especially on one side  of the body.  Your face or eyelid droops to one side.  You have sudden confusion.  You have trouble speaking (aphasia) or understanding.  You have sudden trouble seeing in one or both eyes.  You have sudden trouble walking.  You have dizziness.  You have a loss of balance or  coordination.  You have a sudden, severe headache with no known cause.  You have new chest pain or an irregular heartbeat. Any of these symptoms may represent a serious problem that is an emergency. Do not wait to see if the symptoms will go away. Get medical help at once. Call your local emergency services (911 in [place] Do not drive yourself to the hospital.  This information is not intended to replace advice given to you by your health care provider. Make sure you discuss any questions you have with your health care provider. Document Released: 07/27/2004 Document Revised: 11/25/2015 Document Reviewed: 12/20/2012 Elsevier Interactive Patient Education  2017 [employer].

## 2016-10-11 DIAGNOSIS — Z8546 Personal history of malignant neoplasm of prostate: Secondary | ICD-10-CM | POA: Diagnosis not present

## 2016-10-18 DIAGNOSIS — N5201 Erectile dysfunction due to arterial insufficiency: Secondary | ICD-10-CM | POA: Diagnosis not present

## 2016-10-18 DIAGNOSIS — C61 Malignant neoplasm of prostate: Secondary | ICD-10-CM | POA: Diagnosis not present

## 2016-10-18 DIAGNOSIS — N393 Stress incontinence (female) (male): Secondary | ICD-10-CM | POA: Diagnosis not present

## 2017-01-25 DIAGNOSIS — C61 Malignant neoplasm of prostate: Secondary | ICD-10-CM | POA: Diagnosis not present

## 2017-01-30 NOTE — Progress Notes (Deleted)
[facility]  PATIENT: Billy Fuller DOB: 22-May-1946   REASON FOR VISIT: *** HISTORY FROM:    HISTORY OF PRESENT ILLNESS: HISTORY 2/1/18PSMr [name] is a 47 year Caucasian male seen today for first office follow-up visit following hospital admission for stroke in November 2017. History is obtain from the patient and review of hospital records. 71 year old right-handed man who presented to the [facility] emergency department for evaluation of left-sided numbness.  He reported that he was in his usual state of health until about noon today. He says that he was doing some routine housework and was fixing himself some lunch. At 11:30, he put something in the oven and set the timer for 30 minutes. At 12:00, when the timer went off, he tried to get up to go get his food and he fell to the floor because his left leg gave out. He states that his left leg felt numb. However, he was able to get up by himself and walk to the kitchen to prepare his lunch and feedhis dog. He then states that he went to sit down in the chair to watch the news and when he got up he again fell to the ground because his left leg gave out. This occurred a total of 4 times. In addition, at one point, he tried to grab his drink with his left hand and states that he drink fell through his fingers because he could not hold onto it. He also had an episode where he was trying to pick up the remote control with his left hand and again had difficulty hodlingon to it.Eventually, he noted numbness in the left arm and left leg. He also had some numbness around the left jaw and neck. He does not endorse any actual weakness, just numbness. He denies any lightheadedness or dizziness but says that he felt woozy when he fell. Currently, he says that his symptoms have improved but he still has some decreased sensation of the left side of his body. He denies any double vision, vision loss, slurred speech, difficulty swallowing, or  right-sided deficits. He denied any history of stroke or TIA. He has not had any recent injury to the head or neck. He does not take daily antiplatelet therapy. He stated that he had no history of hypertension or high cholesterol. He was admitted to the hospital for stroke workup. MRI scan confirms small right thalamic infarct. MRA of the brain showed no large vessel intracranial stenosis. Carotid ultrasound showed no significant neck second stenosis. Transversely echo showed normal ejection fraction. LDL cholesterol is elevated at 138. Hemoglobin A1c was 5.4. Patient was started on aspirin for stroke prevention and Lipitor. He states is tolerating both medications well without bleeding, bruising or muscle aches or pains. He has made full recovery and has no complaints of deficits. He is quite active and works out in [employer] every day. He is also watching his diet and is trying to lose weight.   REVIEW OF SYSTEMS: Full 14 system review of systems performed and notable only for those listed, all others are neg:  Constitutional: neg  Cardiovascular: neg Ear/Nose/Throat: neg  Skin: neg Eyes: neg Respiratory: neg Gastroitestinal: neg  Hematology/Lymphatic: neg  Endocrine: neg Musculoskeletal:neg Allergy/Immunology: neg Neurological: neg Psychiatric: neg Sleep : neg   ALLERGIES: No Known Allergies  HOME MEDICATIONS: Outpatient Medications Prior to Visit  Medication Sig Dispense Refill  . aspirin 325 MG tablet Take 1 tablet (325 mg total) by mouth daily. 30 tablet 0  .  atorvastatin (LIPITOR) 40 MG tablet Take 1 tablet (40 mg total) by mouth daily at 6 PM. 30 tablet 0   No facility-administered medications prior to visit.     PAST MEDICAL HISTORY: Past Medical History:  Diagnosis Date  . Cataract    eye implants  . ED (erectile dysfunction)   . Nephrolithiasis   . Prostate CA ([place]) 08/06/09   recurrent  . Stroke ([place])   . Wears dentures    upper    PAST SURGICAL HISTORY: Past  Surgical History:  Procedure Laterality Date  . ROBOT ASSISTED LAPAROSCOPIC RADICAL PROSTATECTOMY     10/06/09    FAMILY HISTORY: Family History  Problem Relation Age of Onset  . Cancer Mother        breast  . Kidney failure Father     SOCIAL HISTORY: Social History   Social History  . Marital status: Single    Spouse name: N/A  . Number of children: N/A  . Years of education: N/A   Occupational History  .      Professional Pension scheme manager   Social History Main Topics  . Smoking status: Former Smoker    Quit date: 05/31/1979  . Smokeless tobacco: Never Used  . Alcohol use Yes     Comment: 4 alcoholic beverages/week  . Drug use: No  . Sexual activity: Not on file   Other Topics Concern  . Not on file   Social History Narrative  . No narrative on file     PHYSICAL EXAM  There were no vitals filed for this visit. There is no height or weight on file to calculate BMI.  Generalized: Well developed, in no acute distress  Head: normocephalic and atraumatic,. Oropharynx benign  Neck: Supple, no carotid bruits  Cardiac: Regular rate rhythm, no murmur  Musculoskeletal: No deformity   Neurological examination   Mentation: Alert oriented to time, place, history taking. Attention span and concentration appropriate. Recent and remote memory intact.  Follows all commands speech and language fluent.   Cranial nerve II-XII: Fundoscopic exam reveals sharp disc margins.Pupils were equal round reactive to light extraocular movements were full, visual field were full on confrontational test. Facial sensation and strength were normal. hearing was intact to finger rubbing bilaterally. Uvula tongue midline. head turning and shoulder shrug were normal and symmetric.Tongue protrusion into cheek strength was normal. Motor: normal bulk and tone, full strength in the BUE, BLE, fine finger movements normal, no pronator drift. No focal weakness Sensory: normal and symmetric  to light touch, pinprick, and  Vibration, proprioception  Coordination: finger-nose-finger, heel-to-shin bilaterally, no dysmetria Reflexes: Brachioradialis 2/2, biceps 2/2, triceps 2/2, patellar 2/2, Achilles 2/2, plantar responses were flexor bilaterally. Gait and Station: Rising up from seated position without assistance, normal stance,  moderate stride, good arm swing, smooth turning, able to perform tiptoe, and heel walking without difficulty. Tandem gait is steady  DIAGNOSTIC DATA (LABS, IMAGING, TESTING) - I reviewed patient records, labs, notes, testing and imaging myself where available.  Lab Results  Component Value Date   WBC 4.9 06/01/2016   HGB 15.5 06/01/2016   HCT 44.4 06/01/2016   MCV 90.1 06/01/2016   PLT 129 (L) 06/01/2016      Component Value Date/Time   NA 137 06/01/2016 0400   K 4.1 06/01/2016 0400   CL 105 06/01/2016 0400   CO2 24 06/01/2016 0400   GLUCOSE 115 (H) 06/01/2016 0400   BUN 13 06/01/2016 0400   CREATININE 1.02 06/01/2016 0400  CALCIUM 8.8 (L) 06/01/2016 0400   PROT 5.8 (L) 05/31/2016 0454   ALBUMIN 3.6 05/31/2016 0454   AST 21 05/31/2016 0454   ALT 15 (L) 05/31/2016 0454   ALKPHOS 53 05/31/2016 0454   BILITOT 0.6 05/31/2016 0454   GFRNONAA >60 06/01/2016 0400   GFRAA >60 06/01/2016 0400   Lab Results  Component Value Date   CHOL 213 (H) 05/31/2016   HDL 36 (L) 05/31/2016   LDLCALC 138 (H) 05/31/2016   TRIG 193 (H) 05/31/2016   CHOLHDL 5.9 05/31/2016   Lab Results  Component Value Date   HGBA1C 5.4 05/30/2016    ASSESSMENT AND PLAN 60 year patient male with right thalamic infarct in November 2017 from small vessel disease with excellent clinical recovery. Vascular risk factors of age and hyperlipidemia only   Dennie Bible, [facility], [facility] [address], [place], [place] [address] [phone]

## 2017-01-31 ENCOUNTER — Ambulatory Visit: Payer: PPO | Admitting: Nurse Practitioner

## 2017-01-31 DIAGNOSIS — N393 Stress incontinence (female) (male): Secondary | ICD-10-CM | POA: Diagnosis not present

## 2017-01-31 DIAGNOSIS — N5201 Erectile dysfunction due to arterial insufficiency: Secondary | ICD-10-CM | POA: Diagnosis not present

## 2017-01-31 DIAGNOSIS — R9721 Rising PSA following treatment for malignant neoplasm of prostate: Secondary | ICD-10-CM | POA: Diagnosis not present

## 2017-01-31 DIAGNOSIS — C61 Malignant neoplasm of prostate: Secondary | ICD-10-CM | POA: Diagnosis not present

## 2017-02-01 ENCOUNTER — Encounter: Payer: Self-pay | Admitting: Nurse Practitioner

## 2017-02-19 ENCOUNTER — Other Ambulatory Visit: Payer: Self-pay | Admitting: Urology

## 2017-02-19 DIAGNOSIS — C61 Malignant neoplasm of prostate: Secondary | ICD-10-CM

## 2017-03-09 DIAGNOSIS — C61 Malignant neoplasm of prostate: Secondary | ICD-10-CM | POA: Diagnosis not present

## 2017-03-13 ENCOUNTER — Encounter (HOSPITAL_COMMUNITY)
Admission: RE | Admit: 2017-03-13 | Discharge: 2017-03-13 | Disposition: A | Discharge status: Home / Self Care | Payer: PPO | Source: Ambulatory Visit | Attending: Urology | Admitting: Urology

## 2017-03-13 DIAGNOSIS — C61 Malignant neoplasm of prostate: Secondary | ICD-10-CM | POA: Diagnosis not present

## 2017-03-13 MED ORDER — AXUMIN (FLUCICLOVINE F 18) INJECTION: 9.7000 | Freq: Once | INTRAVENOUS | Status: DC

## 2017-03-28 ENCOUNTER — Encounter: Payer: Self-pay | Admitting: Radiation Oncology

## 2017-03-28 DIAGNOSIS — C778 Secondary and unspecified malignant neoplasm of lymph nodes of multiple regions: Secondary | ICD-10-CM | POA: Diagnosis not present

## 2017-03-28 DIAGNOSIS — R9721 Rising PSA following treatment for malignant neoplasm of prostate: Secondary | ICD-10-CM | POA: Diagnosis not present

## 2017-03-28 DIAGNOSIS — C61 Malignant neoplasm of prostate: Secondary | ICD-10-CM | POA: Diagnosis not present

## 2017-03-30 NOTE — Progress Notes (Signed)
Histology and Location of Primary Cancer: prostate cancer  Location(s) of Symptomatic tumor(s): Pet scan from 03/19/17 showed right external iliac and 2 left paraaortic nodes  Past/Anticipated chemotherapy by medical oncology, if any: referral to Dr. Osker Mason - apt 04/06/17  Pain on a scale of 0-10 is: 0  SAFETY ISSUES:  Prior radiation? March 27, 2011 through May 22, 2011 - Central lower pelvis area, 6840 cGy in 38 fractions    Pacemaker/ICD? no  Possible current pregnancy? no  Is the patient on methotrexate? no  Additional Complaints / other details:    BP 129/74 (BP Location: Left Arm, Patient Position: Sitting)   Pulse 70   Temp 97.7 F (36.5 C) (Oral)   Ht 5\' 5"  (1.651 m)   Wt 165 lb 6.4 oz (75 kg)   SpO2 100%   BMI 27.52 kg/m    Wt Readings from Last 3 Encounters:  04/04/17 165 lb 6.4 oz (75 kg)  08/03/16 169 lb (76.7 kg)  05/30/16 171 lb (77.6 kg)

## 2017-04-02 ENCOUNTER — Encounter: Payer: Self-pay | Admitting: Oncology

## 2017-04-02 ENCOUNTER — Telehealth: Payer: Self-pay | Admitting: Oncology

## 2017-04-02 NOTE — Telephone Encounter (Signed)
Appt has been scheduled for the pt to see Dr.  Alen Blew on 10/5 at 2pm. Pt aware to arrive 30 minutes early. Letter faxed to the referring.

## 2017-04-04 ENCOUNTER — Ambulatory Visit
Admission: RE | Admit: 2017-04-04 | Discharge: 2017-04-04 | Disposition: A | Discharge status: Home / Self Care | Payer: PPO | Source: Ambulatory Visit | Attending: Radiation Oncology | Admitting: Radiation Oncology

## 2017-04-04 ENCOUNTER — Encounter: Payer: Self-pay | Admitting: Radiation Oncology

## 2017-04-04 VITALS — BP 129/74 | HR 70 | Temp 97.7°F | Ht 65.0 in | Wt 165.4 lb

## 2017-04-04 DIAGNOSIS — C778 Secondary and unspecified malignant neoplasm of lymph nodes of multiple regions: Secondary | ICD-10-CM | POA: Diagnosis not present

## 2017-04-04 DIAGNOSIS — Z9079 Acquired absence of other genital organ(s): Secondary | ICD-10-CM | POA: Diagnosis not present

## 2017-04-04 DIAGNOSIS — C61 Malignant neoplasm of prostate: Secondary | ICD-10-CM

## 2017-04-04 DIAGNOSIS — Z923 Personal history of irradiation: Secondary | ICD-10-CM | POA: Diagnosis not present

## 2017-04-04 DIAGNOSIS — Z9289 Personal history of other medical treatment: Secondary | ICD-10-CM | POA: Diagnosis not present

## 2017-04-04 DIAGNOSIS — Z8546 Personal history of malignant neoplasm of prostate: Secondary | ICD-10-CM | POA: Diagnosis not present

## 2017-04-04 NOTE — Progress Notes (Signed)
Please see the Nurse Progress Note in the MD Initial Consult Encounter for this patient. 

## 2017-04-04 NOTE — Progress Notes (Signed)
Radiation Oncology         [phone] ________________________________  Initial Outpatient Consultation  Name: Billy Fuller MRN: [id number]  Date: 04/04/2017  DOB: [birth year]  VH:QIONGEX, [name], MD  Irine Seal, MD   REFERRING PHYSICIAN: Irine Seal, MD  DIAGNOSIS:  Recurrent prostate cancer   HISTORY OF PRESENT ILLNESS::Billy Fuller is a 71 y.o. male who presents with prostate carcinoma post prostatectomy (10/06/2009) and radiation therapy with biochemical recurrence. Patient presented to [facility] to be placed on the Abicure trial with albraterone, prednisone, and firmagon. He has been off treatment since 10/2013. Patient's PSA has been steadily rising for the past 6 months. His PSA in July was 2.59 and his most recent PSA from 03/28/17 was 5.86. His testosterone was over 500 in the spring.   He is currently under the care of Dr. [name]. Dr. [name] ordered an [place] PET scan on 03/13/17. This revealed low to moderate radiotracer activity within a single right external iliac lymph node and two adjacent left periaortic lymph nodes. This is suggestive of prostate cancer nodal metastasis. There was no evidence of recurrence in the prostate bed or distant metastatic disease. Patient denies urinary incontinence, rectal bleeding, or diarrhea. He denies any problems with energy or appetite. Patient notes a minor stroke in November of 2017 and denies residual muscle weakness or speech changes. Patient presents to discuss the role of radiation therapy as part of his treatment.  PREVIOUS RADIATION THERAPY: Yes 03/27/2011-05/22/2011: 6840 cGy to the central lower pelvis area in 38 fractions  PAST MEDICAL HISTORY:  has a past medical history of Cataract; ED (erectile dysfunction); Nephrolithiasis; Prostate CA ([place]) (08/06/09); Stroke [facility]); and Wears dentures.    PAST SURGICAL HISTORY: Past Surgical History:  Procedure Laterality Date  . ROBOT ASSISTED LAPAROSCOPIC RADICAL PROSTATECTOMY     10/06/09      FAMILY HISTORY: family history includes Cancer in his mother; Kidney failure in his father; Prostate cancer in his father.  SOCIAL HISTORY:  reports that he quit smoking about 37 years ago. He has never used smokeless tobacco. He reports that he drinks alcohol. He reports that he does not use drugs.  ALLERGIES: Patient has no known allergies.  MEDICATIONS:  Current Outpatient Prescriptions  Medication Sig Dispense Refill  . aspirin 325 MG tablet Take 1 tablet (325 mg total) by mouth daily. 30 tablet 0  . atorvastatin (LIPITOR) 40 MG tablet Take 1 tablet (40 mg total) by mouth daily at 6 PM. 30 tablet 0   No current facility-administered medications for this encounter.     REVIEW OF SYSTEMS:  REVIEW OF SYSTEMS: A 10+ POINT REVIEW OF SYSTEMS WAS OBTAINED including neurology, dermatology, psychiatry, cardiac, respiratory, lymph, extremities, GI, GU, musculoskeletal, constitutional, reproductive, HEENT. All pertinent positives are noted in the HPI. All others are negative.   PHYSICAL EXAM:  height is 5\' 5"  (1.651 m) and weight is 165 lb 6.4 oz (75 kg). His oral temperature is 97.7 F (36.5 C). His blood pressure is 129/74 and his pulse is 70. His oxygen saturation is 100%.   General: Alert and oriented, in no acute distress HEENT: Head is normocephalic. Extraocular movements are intact. Oropharynx is clear. Neck: Neck is supple, no palpable cervical or supraclavicular lymphadenopathy. Heart: Regular in rate and rhythm with no murmurs, rubs, or gallops. Chest: Clear to auscultation bilaterally, with no rhonchi, wheezes, or rales. Abdomen: Soft, nontender, nondistended, with no rigidity or guarding. Extremities: No cyanosis or edema. Lymphatics: see Neck Exam Skin: No concerning  lesions. Musculoskeletal: symmetric strength and muscle tone throughout. Neurologic: Cranial nerves II through XII are grossly intact. No obvious focalities. Speech is fluent. Coordination is  intact. Psychiatric: Judgment and insight are intact. Affect is appropriate.   ECOG = 0  0 - Asymptomatic (Fully active, able to carry on all predisease activities without restriction)  1 - Symptomatic but completely ambulatory (Restricted in physically strenuous activity but ambulatory and able to carry out work of a light or sedentary nature. For example, light housework, office work)  2 - Symptomatic, <50% in bed during the day (Ambulatory and capable of all self care but unable to carry out any work activities. Up and about more than 50% of waking hours)  3 - Symptomatic, >50% in bed, but not bedbound (Capable of only limited self-care, confined to bed or chair 50% or more of waking hours)  4 - Bedbound (Completely disabled. Cannot carry on any self-care. Totally confined to bed or chair)  5 - Death   Eustace Pen MM, Creech RH, Tormey DC, et al. [phone]). "Toxicity and response criteria of the [facility]". [place] Oncol. 5 (6): 649-55  LABORATORY DATA:  Lab Results  Component Value Date   WBC 4.9 06/01/2016   HGB 15.5 06/01/2016   HCT 44.4 06/01/2016   MCV 90.1 06/01/2016   PLT 129 (L) 06/01/2016   NEUTROABS 2.9 05/30/2016   Lab Results  Component Value Date   NA 137 06/01/2016   K 4.1 06/01/2016   CL 105 06/01/2016   CO2 24 06/01/2016   GLUCOSE 115 (H) 06/01/2016   CREATININE 1.02 06/01/2016   CALCIUM 8.8 (L) 06/01/2016      RADIOGRAPHY: Nm Pet (axumin) Skull Base To Mid Thigh  Result Date: 03/19/2017 CLINICAL DATA:  Prostate carcinoma with biochemical recurrence. PSA equal on 2.7 EXAM: NUCLEAR MEDICINE PET SKULL BASE TO THIGH TECHNIQUE: 9.7 mCi F-18 Fluciclovine was injected intravenously. Full-ring PET imaging was performed from the skull base to thigh after the radiotracer. CT data was obtained and used for attenuation correction and anatomic localization. COMPARISON:  Na fluoride PET scan 12/18/2012 FINDINGS: NECK No radiotracer activity in  neck lymph nodes. CHEST No radiotracer accumulation within mediastinal or hilar lymph nodes. No suspicious pulmonary nodules on the CT scan. ABDOMEN/PELVIS Post prostatectomy No significant radiotracer accumulation within the prostate bed. Very mild radiotracer accumulation within a small RIGHT external iliac lymph node measuring 7 mm (image 164, series 4) with SUV max equal 2.5. More intense radiotracer activity associated with a small LEFT common iliac lymph node at the aortic bifurcation measuring 6 mm short axis (image 135, series 4) with SUV max equal 4.0. Slightly more superior mild radiotracer activity associated with a very small LEFT periaortic lymph node measuring 5 mm short axis (image 129, series 4) with SUV max equal 2.4. No abnormal radiotracer accumulation liver. Atherosclerotic calcification of the aorta. Benign-appearing calcific density in the mesenteries of the RIGHT lower quadrant SKELETON No focal  activity to suggest skeletal metastasis. IMPRESSION: 1. Low to moderate radiotracer activity within a single RIGHT external iliac lymph node and two adjacent left periaoritc lymph nodes may represent prostate cancer nodal metastasis. 2. No evidence of recurrence in the prostate bed. 3. No evidence of distant metastatic disease. 4. No evidence of skeletal disease metastasis. Electronically Signed   By: Suzy Bouchard M.D.   On: 03/19/2017 09:08      IMPRESSION: 71 y.o. man with recurrent T3a N0 Mx Gleason 7 (3+4) prostate cancer. Patient  has had previous prostatectomy, salvage radiation therapy, hormonal therapy, and now has an additional recurrence isolated to three lymph nodes within the low periotic and right upper pelvic region. Based on PET (axumin), patient may be a candidate for salvage radiation therapy with SBRT directed at these three areas of recurrence. However, confounding this potential therapy is his previous post prostatectomy radiation therapy. We will have to review previous  radiation fields to see if right iliac node was included in previous radiation treatment. The other complicating issue is one of the periaortic nodal recurrences is immediately adjacent to a loop of small bowel. If this small bowel is fixed in this location, the patient would not be a candidate for SBRT. The third area of recurrence in the  periaortic region would be safely treated with SBRT.   PLAN: Final details concerning potential radiation therapy pending review from his previous treatment and pending medical oncology. Patient will be seen by Dr. Alen Blew on 04/06/17.     Addendum: I have carefully reviewed the patient's previous post-prostatectomy radiation therapy. We merged the patient's previous radiation fields with his current PET scan images. His previous radiation fields did include the right external iliac node mentioned above. Given this finding and the close proximity to small bowel of one of the periaortic nodal areas of recurrence, I would not recommend additional radiation therapy as part of this gentleman's management given the potential for severe toxicity.   ------------------------------------------------  Blair Promise, PhD, MD   This document serves as a record of services personally performed by Gery Pray, MD. It was created on his behalf by Bethann Humble, a trained medical scribe. The creation of this record is based on the scribe's personal observations and the provider's statements to them. This document has been checked and approved by the attending provider.

## 2017-04-06 ENCOUNTER — Ambulatory Visit (HOSPITAL_BASED_OUTPATIENT_CLINIC_OR_DEPARTMENT_OTHER): Payer: PPO | Admitting: Oncology

## 2017-04-06 ENCOUNTER — Encounter: Payer: Self-pay | Admitting: Medical Oncology

## 2017-04-06 VITALS — BP 116/62 | HR 77 | Temp 98.0°F | Resp 20 | Ht 65.0 in | Wt 163.6 lb

## 2017-04-06 DIAGNOSIS — C61 Malignant neoplasm of prostate: Secondary | ICD-10-CM

## 2017-04-06 NOTE — Progress Notes (Signed)
Reason for Referral: Prostate cancer.   HPI: 71 year old gentleman native of [place] but currently lives in [place]. He is a gentleman with history of nephrolithiasis and stroke. He was diagnosed with prostate cancer in 2011. At that time he underwent a radical prostatectomy and found to have a Gleason score 3+4 = 7 with extracapsular extension but negative margins. He received adjuvant radiation therapy at that time. He subsequently had biochemical recurrence with a PSA up to 13 and 2014. He was treated as a part of a clinical trial with ADT and Zytiga. His PSA remained undetectable for the last 3 years. His last hormone treatment was in April 2015 He has been followed by Dr. [name] and most recently his PSA has been rising. His PSA on 03/28/2017 was 5.86. In July 2018 his PSA was 2.59. He underwent PET scan on 03/13/2017 which showed low to moderate radiotracer activity within the right external iliac lymph node and 2 adjacent left periaortic lymph node. He is completely asymptomatic from these findings without any complaints. He denies any abdominal pain, pelvic pain or back pain. He denied any genitourinary complaints. He was evaluated by Dr. Sondra Come and felt additional radiation is not indicated. He is currently retired and continues to be active attending to activities of daily living.  He does not report any headaches, blurry vision, syncope or seizures. He does not report any fevers, chills or sweats. He does not report any cough, wheezing or hemoptysis. He does not report any nausea, vomiting or abdominal pain. He does not report any frequency urgency or hesitancy. He does not report any skeletal complaints. He does not report any arthralgias or myalgias. Remaining review of systems unremarkable.   Past Medical History:  Diagnosis Date  . Cataract    eye implants  . ED (erectile dysfunction)   . Nephrolithiasis   . Prostate CA ([place]) 08/06/09   recurrent  . Stroke ([place])   . Wears  dentures    upper  :  Past Surgical History:  Procedure Laterality Date  . ROBOT ASSISTED LAPAROSCOPIC RADICAL PROSTATECTOMY     10/06/09  :   Current Outpatient Prescriptions:  .  aspirin 325 MG tablet, Take 1 tablet (325 mg total) by mouth daily., Disp: 30 tablet, Rfl: 0 .  atorvastatin (LIPITOR) 40 MG tablet, Take 1 tablet (40 mg total) by mouth daily at 6 PM., Disp: 30 tablet, Rfl: 0:  No Known Allergies:  Family History  Problem Relation Age of Onset  . Cancer Mother        breast  . Kidney failure Father   . Prostate cancer Father   :  Social History   Social History  . Marital status: Single    Spouse name: N/A  . Number of children: N/A  . Years of education: N/A   Occupational History  .      Professional Pension scheme manager   Social History Main Topics  . Smoking status: Former Smoker    Quit date: 05/31/1979  . Smokeless tobacco: Never Used  . Alcohol use Yes     Comment: 4 alcoholic beverages/week  . Drug use: No  . Sexual activity: Not on file   Other Topics Concern  . Not on file   Social History Narrative  . No narrative on file  :  Pertinent items are noted in HPI.  Exam: Blood pressure 116/62, pulse 77, temperature 98 F (36.7 C), temperature source Oral, resp. rate 20, height 5\' 5"  (1.651 m), weight 163  lb 9.6 oz (74.2 kg), SpO2 98 %.  ECOG 0  General appearance: alert and cooperative appeared without distress. Throat: No oral thrush or ulcers. Neck: no adenopathy or neck masses. Resp: clear to auscultation bilaterally without rhonchi or wheezes. Chest wall: no tenderness Cardio: regular rate and rhythm, S1, S2 normal, no murmur, click, rub or gallop GI: soft, non-tender; bowel sounds normal; no masses,  no organomegaly Extremities: extremities normal, atraumatic, no cyanosis or edema Pulses: 2+ and symmetric Skin: Skin color, texture, turgor normal. No rashes or lesions Lymph nodes: Cervical, supraclavicular, and  axillary nodes normal.  CBC    Component Value Date/Time   WBC 4.9 06/01/2016 0400   RBC 4.93 06/01/2016 0400   HGB 15.5 06/01/2016 0400   HCT 44.4 06/01/2016 0400   PLT 129 (L) 06/01/2016 0400   MCV 90.1 06/01/2016 0400   MCH 31.4 06/01/2016 0400   MCHC 34.9 06/01/2016 0400   RDW 12.9 06/01/2016 0400   LYMPHSABS 1.6 05/30/2016 1744   MONOABS 0.5 05/30/2016 1744   EOSABS 0.1 05/30/2016 1744   BASOSABS 0.0 05/30/2016 1744     Chemistry      Component Value Date/Time   NA 137 06/01/2016 0400   K 4.1 06/01/2016 0400   CL 105 06/01/2016 0400   CO2 24 06/01/2016 0400   BUN 13 06/01/2016 0400   CREATININE 1.02 06/01/2016 0400      Component Value Date/Time   CALCIUM 8.8 (L) 06/01/2016 0400   ALKPHOS 53 05/31/2016 0454   AST 21 05/31/2016 0454   ALT 15 (L) 05/31/2016 0454   BILITOT 0.6 05/31/2016 0454        Nm Pet (axumin) Skull Base To Mid Thigh  Result Date: 03/19/2017 CLINICAL DATA:  Prostate carcinoma with biochemical recurrence. PSA equal on 2.7 EXAM: NUCLEAR MEDICINE PET SKULL BASE TO THIGH TECHNIQUE: 9.7 mCi F-18 Fluciclovine was injected intravenously. Full-ring PET imaging was performed from the skull base to thigh after the radiotracer. CT data was obtained and used for attenuation correction and anatomic localization. COMPARISON:  Na fluoride PET scan 12/18/2012 FINDINGS: NECK No radiotracer activity in neck lymph nodes. CHEST No radiotracer accumulation within mediastinal or hilar lymph nodes. No suspicious pulmonary nodules on the CT scan. ABDOMEN/PELVIS Post prostatectomy No significant radiotracer accumulation within the prostate bed. Very mild radiotracer accumulation within a small RIGHT external iliac lymph node measuring 7 mm (image 164, series 4) with SUV max equal 2.5. More intense radiotracer activity associated with a small LEFT common iliac lymph node at the aortic bifurcation measuring 6 mm short axis (image 135, series 4) with SUV max equal 4.0. Slightly  more superior mild radiotracer activity associated with a very small LEFT periaortic lymph node measuring 5 mm short axis (image 129, series 4) with SUV max equal 2.4. No abnormal radiotracer accumulation liver. Atherosclerotic calcification of the aorta. Benign-appearing calcific density in the mesenteries of the RIGHT lower quadrant SKELETON No focal  activity to suggest skeletal metastasis. IMPRESSION: 1. Low to moderate radiotracer activity within a single RIGHT external iliac lymph node and two adjacent left periaoritc lymph nodes may represent prostate cancer nodal metastasis. 2. No evidence of recurrence in the prostate bed. 3. No evidence of distant metastatic disease. 4. No evidence of skeletal disease metastasis. Electronically Signed   By: Suzy Bouchard M.D.   On: 03/19/2017 09:08    Assessment and Plan:   71 year old gentleman with the following issues:  1. Prostate cancer diagnosed in 2011. He is status post  radical prostatectomy on 10/06/2009. The final pathology revealed Gleason score 3+4 = 7 T3a  prostate cancer. He received adjuvant radiation therapy completed in 2011.  He subsequently developed biochemical relapse with PSA up to 13 and was treated as a part of clinical trial with androgen deprivation therapy and Zytiga through April 2015. He had an undetectable PSA after that. He developed recurrent disease with a PSA of 5.86 in September 2018. His PSA in July 2018 was 2.59. PET scan obtained on 03/13/2017 showed periaortic and right external iliac lymph node potential involvement.  The natural course of this disease was discussed today with the patient. He appears to have developed a second relapse with possible measurable disease with abdominal adenopathy. Any treatment at this time would be less likely curative at this point. Treating oligo metastatic disease with radiation therapy does not appear to be an option. We have discussed the role of primary surgical therapy and  lymphadenectomy in this particular setting with very limited evidence to suggest overall long-term benefit.  I recommended the standard of care at this time which would include androgen deprivation therapy to be done continuously. The role of adding Zytiga at this point was discussed. Complications associated with this medication were reviewed and included written information was given.   Given his exposure to [place] previously it is unknown whether additional treatment is needed or beneficialat this time. Given his low volume disease and initial Gleason score, I am in favor of treating him with androgen deprivation therapy only and using Zytiga at a later date. I see no role for systemic chemotherapy at this point given his low volume disease.  I will communicate with Dr. [name] to start androgen deprivation therapy in the immediate future.  2. Follow-up: I'm happy to see him in the future as needed especially if his PSA starts to rise again after ADT.

## 2017-04-09 NOTE — Progress Notes (Signed)
I met Billy Fuller and his fiance Billy Fuller and introduced myself as the prostate navigator and my role.  He states he had surgery in 2011 for prostatectomy. He had a biochemical recurrence and was on a clinical trial where his PSA was undetectable for 3 years. He states that his recent PET shows node involvement. He saw Dr. Sondra Come and he did not feel he was a candidate for further radiation. He is going to start ADT and may start Zytiga in the future. I gave him literature about Zytiga. I gave them my business card and asked them to call with questions or concerns.

## 2017-06-13 DIAGNOSIS — C61 Malignant neoplasm of prostate: Secondary | ICD-10-CM | POA: Diagnosis not present

## 2017-06-18 ENCOUNTER — Other Ambulatory Visit: Payer: Self-pay | Admitting: Urology

## 2017-06-18 DIAGNOSIS — R9721 Rising PSA following treatment for malignant neoplasm of prostate: Secondary | ICD-10-CM | POA: Diagnosis not present

## 2017-06-18 DIAGNOSIS — C61 Malignant neoplasm of prostate: Secondary | ICD-10-CM

## 2017-06-18 DIAGNOSIS — C778 Secondary and unspecified malignant neoplasm of lymph nodes of multiple regions: Secondary | ICD-10-CM | POA: Diagnosis not present

## 2017-06-19 ENCOUNTER — Telehealth: Payer: Self-pay | Admitting: Oncology

## 2017-06-19 ENCOUNTER — Encounter: Payer: Self-pay | Admitting: *Deleted

## 2017-06-19 NOTE — Telephone Encounter (Signed)
Spoke with patient regarding appointment date/time/loc.

## 2017-07-10 ENCOUNTER — Encounter (HOSPITAL_COMMUNITY)
Admission: RE | Admit: 2017-07-10 | Discharge: 2017-07-10 | Disposition: A | Discharge status: Home / Self Care | Payer: PPO | Source: Ambulatory Visit | Attending: Urology | Admitting: Urology

## 2017-07-10 DIAGNOSIS — C61 Malignant neoplasm of prostate: Secondary | ICD-10-CM | POA: Diagnosis not present

## 2017-07-10 MED ORDER — AXUMIN (FLUCICLOVINE F 18) INJECTION
10.2000 | Freq: Once | INTRAVENOUS | Status: AC
  Administered 2017-07-10: 10.2 via INTRAVENOUS

## 2017-07-11 DIAGNOSIS — C61 Malignant neoplasm of prostate: Secondary | ICD-10-CM | POA: Diagnosis not present

## 2017-07-11 DIAGNOSIS — Z5111 Encounter for antineoplastic chemotherapy: Secondary | ICD-10-CM | POA: Diagnosis not present

## 2017-07-26 ENCOUNTER — Telehealth: Payer: Self-pay | Admitting: Oncology

## 2017-07-26 ENCOUNTER — Inpatient Hospital Stay: Payer: PPO | Attending: Oncology | Admitting: Oncology

## 2017-07-26 VITALS — BP 138/67 | HR 81 | Temp 98.8°F | Resp 18 | Ht 65.0 in | Wt 167.4 lb

## 2017-07-26 DIAGNOSIS — Z923 Personal history of irradiation: Secondary | ICD-10-CM | POA: Insufficient documentation

## 2017-07-26 DIAGNOSIS — C61 Malignant neoplasm of prostate: Secondary | ICD-10-CM | POA: Diagnosis not present

## 2017-07-26 DIAGNOSIS — C778 Secondary and unspecified malignant neoplasm of lymph nodes of multiple regions: Secondary | ICD-10-CM | POA: Insufficient documentation

## 2017-07-26 DIAGNOSIS — Z9079 Acquired absence of other genital organ(s): Secondary | ICD-10-CM | POA: Diagnosis not present

## 2017-07-26 DIAGNOSIS — Z79899 Other long term (current) drug therapy: Secondary | ICD-10-CM | POA: Insufficient documentation

## 2017-07-26 NOTE — Telephone Encounter (Signed)
Gave patient avs report and appointments for May  °

## 2017-07-26 NOTE — Progress Notes (Signed)
Hematology and Oncology Follow Up Visit  Billy Fuller [id number] 1946/03/30 72 y.o. 07/26/2017 3:23 PM [name], MDWolters, [name], MD   Principle Diagnosis: 73 year old gentleman with prostate cancer diagnosed in 2011.  He has advanced disease currently with pelvic adenopathy.  His initial Gleason score was 3+4 = 7.    Prior Therapy:  Status post radical prostatectomy in 2011. He received adjuvant radiation therapy at that time.   He developed biochemical recurrence with a PSA up to 13 and 2014. He was treated as a part of a clinical trial with ADT and Zytiga. His PSA remained undetectable for the last 3 years.  Last hormone treatment was in 2015.   His PSA on 03/28/2017 was 5.86. In July 2018 his PSA was 2.59. He underwent PET scan on 03/13/2017 which showed low to moderate radiotracer activity within the right external iliac lymph node and 2 adjacent left periaortic lymph node.  Current therapy:  Androgen deprivation therapy started in January 2019.  He received Firmagon under the care of Dr. [name]. He is under consideration for additional hormone therapy.  Interim History: [name] presents today for a follow-up visit.  Since the last visit, he started androgen deprivation therapy under the care of Dr. [name] and received Mills Koller in January 2019.  He denied any complications related to this therapy.  He denies any hot flashes, fatigue or tiredness.  He continues to perform activities of daily living without any decline.  He denies any abdominal discomfort, early satiety or pelvic pain.  He does not report any headaches, blurry vision, syncope or seizures. He does not report any fevers, chills or sweats. He does not report any cough, wheezing or hemoptysis. He does not report any nausea, vomiting or abdominal pain. He does not report any frequency urgency or hesitancy. He does not report any skeletal complaints. He does not report any arthralgias or myalgias. Remaining review of  systems is negative.    Medications: I have reviewed the patient's current medications.  Current Outpatient Medications  Medication Sig Dispense Refill  . aspirin 325 MG tablet Take 1 tablet (325 mg total) by mouth daily. 30 tablet 0  . atorvastatin (LIPITOR) 40 MG tablet Take 1 tablet (40 mg total) by mouth daily at 6 PM. 30 tablet 0   No current facility-administered medications for this visit.      Allergies: No Known Allergies  Past Medical History, Surgical history, Social history, and Family History were reviewed and updated.  Marland Kitchen Physical Exam: Blood pressure 138/67, pulse 81, temperature 98.8 F (37.1 C), temperature source Oral, resp. rate 18, height 5\' 5"  (1.651 m), weight 167 lb 6.4 oz (75.9 kg), SpO2 98 %. ECOG: 0 General appearance: alert and cooperative appeared without distress. Head: Normocephalic, without obvious abnormality  Oral mucosa without thrush or ulcers. Eyes: No scleral icterus. Lymph nodes: Cervical, supraclavicular, and axillary nodes normal. Heart:regular rate and rhythm, S1, S2 normal, no murmur, click, rub or gallop Lung:chest clear, no wheezing, rales, normal symmetric air entry Abdomin: soft, non-tender, without masses or organomegaly Musculoskeletal: No joint deformity or joint effusion.  Full range of motion noted.   Lab Results: Lab Results  Component Value Date   WBC 4.9 06/01/2016   HGB 15.5 06/01/2016   HCT 44.4 06/01/2016   MCV 90.1 06/01/2016   PLT 129 (L) 06/01/2016     Chemistry      Component Value Date/Time   NA 137 06/01/2016 0400   K 4.1 06/01/2016 0400   CL  105 06/01/2016 0400   CO2 24 06/01/2016 0400   BUN 13 06/01/2016 0400   CREATININE 1.02 06/01/2016 0400      Component Value Date/Time   CALCIUM 8.8 (L) 06/01/2016 0400   ALKPHOS 53 05/31/2016 0454   AST 21 05/31/2016 0454   ALT 15 (L) 05/31/2016 0454   BILITOT 0.6 05/31/2016 0454       Radiological Studies:  EXAM: NUCLEAR MEDICINE PET SKULL BASE TO  THIGH  TECHNIQUE: 10.2 mCi F-18 Fluciclovine was injected intravenously. Full-ring PET imaging was performed from the skull base to thigh after the radiotracer. CT data was obtained and used for attenuation correction and anatomic localization.  COMPARISON:  03/13/2017  FINDINGS: NECK  No radiotracer activity in neck lymph nodes.  CHEST  No radiotracer accumulation within mediastinal or hilar lymph nodes. No suspicious pulmonary nodules on the CT scan.  ABDOMEN/PELVIS  No focal radiotracer activity within the prostate bed.  Increased radiotracer uptake is seen within 2 right external iliac lymph nodes, largest measuring 11 mm compared to 7 mm previously, and showing SUV max of 6.8 compared to 2.5 previously.  Increased retroperitoneal lymphadenopathy is seen bilaterally at the level of the iliac bifurcation, with largest index lymph node on the right measuring 8 mm compared to 5 mm previously showing SUV max 8.8 compared to 4.9 previously.  New and increased abdominal retroperitoneal lymphadenopathy is also seen in both the left paraaortic and aortocaval spaces. Index lymph node in the left paraaortic region measures 11 mm compared to 5 mm previously, and shows SUV max of 8.2 compared to 1.4 previously.  Physiologic activity noted within the liver and pancreas.  SKELETON  No focal  activity to suggest skeletal metastasis.  IMPRESSION: Interval progression nodal metastatic disease within the pelvis in the right external iliac chain, and abdominal retroperitoneum in the left paraaortic and aortocaval spaces.  No evidence of distant metastatic disease or bone metastases.  No evidence of local prostate bed recurrence.   Impression and Plan:   72 year old gentleman with the following issues:  1.  Advanced prostate cancer with disease into the pelvic adenopathy.  He was initially diagnosed in 2011. He is status post radical prostatectomy on  10/06/2009. The final pathology revealed Gleason score 3+4 = 7 T3a  prostate cancer. He received adjuvant radiation therapy completed in 2011.  He developed biochemical relapse and treated with androgen deprivation and Zytiga between 2012 and 2015 followed by period of observation and surveillance.  He has developed advanced disease as evident by his PET scan that was obtained on January 2019.  The scan was reviewed today personally and discussed with the patient.  He is currently receiving androgen deprivation therapy under the care of Dr. [name].  Options of therapy were reviewed again which include androgen deprivation alone, adding Zytiga or adding Taxotere chemotherapy.  The risks and benefits and rationale for this approach was discussed today in detail.  He is objecting to chemotherapy at this time and would like to consider possibly Zytiga.  Complication associated with Zytiga were reviewed again including fatigue, tiredness, edema but for the most part he should tolerate this medication fairly well.  He also showed concerns about the cost of the medication and I explained to him that we will check with his insurance coverage and we will have a clear idea about his co-pay if any before start the medication.  After discussion today, he elected to proceed with androgen deprivation alone and monitor his PSA.  I explained to  him that he would potentially benefit from adding Zytiga to androgen deprivation alone with potential improvement in overall survival.  Despite this discussion, he still in favor of doing androgen deprivation therapy alone at this time.  I will continue to monitor him periodically and readdress this with him in the future.  2.  Androgen deprivation: I have recommended to continue that indefinitely.  He tolerated Firmagon without any complications so far.  3.  Follow-up: We will be in the next 3-4 months to follow his progress.  15  minutes was spent with the patient  face-to-face today.  More than 50% of time was dedicated to patient counseling, education and answering questions.    Zola Button, MD 1/24/20193:23 PM

## 2017-08-08 DIAGNOSIS — C61 Malignant neoplasm of prostate: Secondary | ICD-10-CM | POA: Diagnosis not present

## 2017-08-13 DIAGNOSIS — I639 Cerebral infarction, unspecified: Secondary | ICD-10-CM | POA: Diagnosis not present

## 2017-08-13 DIAGNOSIS — Z79899 Other long term (current) drug therapy: Secondary | ICD-10-CM | POA: Diagnosis not present

## 2017-08-13 DIAGNOSIS — I6529 Occlusion and stenosis of unspecified carotid artery: Secondary | ICD-10-CM | POA: Diagnosis not present

## 2017-08-13 DIAGNOSIS — I517 Cardiomegaly: Secondary | ICD-10-CM | POA: Diagnosis not present

## 2017-08-13 DIAGNOSIS — E785 Hyperlipidemia, unspecified: Secondary | ICD-10-CM | POA: Diagnosis not present

## 2017-08-13 DIAGNOSIS — C61 Malignant neoplasm of prostate: Secondary | ICD-10-CM | POA: Diagnosis not present

## 2017-08-13 DIAGNOSIS — I69359 Hemiplegia and hemiparesis following cerebral infarction affecting unspecified side: Secondary | ICD-10-CM | POA: Diagnosis not present

## 2017-08-15 DIAGNOSIS — C61 Malignant neoplasm of prostate: Secondary | ICD-10-CM | POA: Diagnosis not present

## 2017-08-15 DIAGNOSIS — R3121 Asymptomatic microscopic hematuria: Secondary | ICD-10-CM | POA: Diagnosis not present

## 2017-09-04 ENCOUNTER — Ambulatory Visit (HOSPITAL_COMMUNITY): Payer: PPO | Admitting: Registered Nurse

## 2017-09-04 ENCOUNTER — Observation Stay (HOSPITAL_COMMUNITY)
Admission: AD | Admit: 2017-09-04 | Discharge: 2017-09-05 | Disposition: A | Discharge status: Home / Self Care | Payer: PPO | Source: Ambulatory Visit | Attending: Urology | Admitting: Urology

## 2017-09-04 ENCOUNTER — Other Ambulatory Visit: Payer: Self-pay

## 2017-09-04 ENCOUNTER — Encounter (HOSPITAL_COMMUNITY)
Admission: AD | Disposition: A | Discharge status: Home / Self Care | Payer: Self-pay | Source: Ambulatory Visit | Attending: Urology

## 2017-09-04 ENCOUNTER — Other Ambulatory Visit: Payer: Self-pay | Admitting: Urology

## 2017-09-04 ENCOUNTER — Encounter (HOSPITAL_COMMUNITY): Payer: Self-pay | Admitting: Certified Registered Nurse Anesthetist

## 2017-09-04 DIAGNOSIS — R3915 Urgency of urination: Secondary | ICD-10-CM | POA: Insufficient documentation

## 2017-09-04 DIAGNOSIS — R310 Gross hematuria: Secondary | ICD-10-CM | POA: Insufficient documentation

## 2017-09-04 DIAGNOSIS — H9190 Unspecified hearing loss, unspecified ear: Secondary | ICD-10-CM | POA: Insufficient documentation

## 2017-09-04 DIAGNOSIS — Z803 Family history of malignant neoplasm of breast: Secondary | ICD-10-CM | POA: Diagnosis not present

## 2017-09-04 DIAGNOSIS — R339 Retention of urine, unspecified: Secondary | ICD-10-CM | POA: Diagnosis not present

## 2017-09-04 DIAGNOSIS — Z923 Personal history of irradiation: Secondary | ICD-10-CM | POA: Insufficient documentation

## 2017-09-04 DIAGNOSIS — N3289 Other specified disorders of bladder: Secondary | ICD-10-CM | POA: Diagnosis not present

## 2017-09-04 DIAGNOSIS — C799 Secondary malignant neoplasm of unspecified site: Secondary | ICD-10-CM | POA: Diagnosis not present

## 2017-09-04 DIAGNOSIS — R338 Other retention of urine: Secondary | ICD-10-CM | POA: Diagnosis not present

## 2017-09-04 DIAGNOSIS — Z7982 Long term (current) use of aspirin: Secondary | ICD-10-CM | POA: Insufficient documentation

## 2017-09-04 DIAGNOSIS — I639 Cerebral infarction, unspecified: Secondary | ICD-10-CM | POA: Diagnosis not present

## 2017-09-04 DIAGNOSIS — Z8673 Personal history of transient ischemic attack (TIA), and cerebral infarction without residual deficits: Secondary | ICD-10-CM | POA: Insufficient documentation

## 2017-09-04 DIAGNOSIS — Z9079 Acquired absence of other genital organ(s): Secondary | ICD-10-CM | POA: Insufficient documentation

## 2017-09-04 DIAGNOSIS — Z87442 Personal history of urinary calculi: Secondary | ICD-10-CM | POA: Insufficient documentation

## 2017-09-04 DIAGNOSIS — Z79899 Other long term (current) drug therapy: Secondary | ICD-10-CM | POA: Diagnosis not present

## 2017-09-04 DIAGNOSIS — R319 Hematuria, unspecified: Secondary | ICD-10-CM | POA: Diagnosis not present

## 2017-09-04 DIAGNOSIS — Z8042 Family history of malignant neoplasm of prostate: Secondary | ICD-10-CM | POA: Insufficient documentation

## 2017-09-04 DIAGNOSIS — Z87891 Personal history of nicotine dependence: Secondary | ICD-10-CM | POA: Diagnosis not present

## 2017-09-04 DIAGNOSIS — N3041 Irradiation cystitis with hematuria: Secondary | ICD-10-CM | POA: Diagnosis not present

## 2017-09-04 DIAGNOSIS — C61 Malignant neoplasm of prostate: Principal | ICD-10-CM | POA: Insufficient documentation

## 2017-09-04 DIAGNOSIS — Z841 Family history of disorders of kidney and ureter: Secondary | ICD-10-CM | POA: Insufficient documentation

## 2017-09-04 DIAGNOSIS — N3040 Irradiation cystitis without hematuria: Secondary | ICD-10-CM | POA: Diagnosis present

## 2017-09-04 HISTORY — PX: CYSTOSCOPY WITH FULGERATION: SHX6638

## 2017-09-04 LAB — CBC
HEMATOCRIT: 40.6 % (ref 39.0–52.0)
Hemoglobin: 14.3 g/dL (ref 13.0–17.0)
MCH: 31.6 pg (ref 26.0–34.0)
MCHC: 35.2 g/dL (ref 30.0–36.0)
MCV: 89.8 fL (ref 78.0–100.0)
PLATELETS: 180 10*3/uL (ref 150–400)
RBC: 4.52 MIL/uL (ref 4.22–5.81)
RDW: 13.1 % (ref 11.5–15.5)
WBC: 5.8 10*3/uL (ref 4.0–10.5)

## 2017-09-04 LAB — HEMOGLOBIN AND HEMATOCRIT, BLOOD
HEMATOCRIT: 36.6 % — AB (ref 39.0–52.0)
HEMOGLOBIN: 12.8 g/dL — AB (ref 13.0–17.0)

## 2017-09-04 SURGERY — CYSTOSCOPY, WITH BLADDER FULGURATION
Anesthesia: General

## 2017-09-04 MED ORDER — MIDAZOLAM HCL 2 MG/2ML IJ SOLN
INTRAMUSCULAR | Status: AC
  Filled 2017-09-04: qty 2

## 2017-09-04 MED ORDER — PROPOFOL 10 MG/ML IV BOLUS
INTRAVENOUS | Status: AC
  Filled 2017-09-04: qty 20

## 2017-09-04 MED ORDER — 0.9 % SODIUM CHLORIDE (POUR BTL) OPTIME
TOPICAL | Status: DC | PRN
  Administered 2017-09-04: 1000 mL

## 2017-09-04 MED ORDER — HYDROMORPHONE HCL 1 MG/ML IJ SOLN: 0.5000 mg | INTRAMUSCULAR | Status: DC | PRN

## 2017-09-04 MED ORDER — FENTANYL CITRATE (PF) 100 MCG/2ML IJ SOLN
INTRAMUSCULAR | Status: AC
  Filled 2017-09-04: qty 2

## 2017-09-04 MED ORDER — DEXAMETHASONE SODIUM PHOSPHATE 10 MG/ML IJ SOLN
INTRAMUSCULAR | Status: AC
  Filled 2017-09-04: qty 1

## 2017-09-04 MED ORDER — KCL IN DEXTROSE-NACL 20-5-0.45 MEQ/L-%-% IV SOLN
INTRAVENOUS | Status: DC
  Administered 2017-09-04: 22:00:00 via INTRAVENOUS
  Filled 2017-09-04: qty 1000

## 2017-09-04 MED ORDER — ONDANSETRON HCL 4 MG/2ML IJ SOLN
INTRAMUSCULAR | Status: DC | PRN
  Administered 2017-09-04: 4 mg via INTRAVENOUS

## 2017-09-04 MED ORDER — ONDANSETRON HCL 4 MG/2ML IJ SOLN
INTRAMUSCULAR | Status: AC
Start: 2017-09-04 — End: ?
  Filled 2017-09-04: qty 2

## 2017-09-04 MED ORDER — FENTANYL CITRATE (PF) 100 MCG/2ML IJ SOLN: 25.0000 ug | INTRAMUSCULAR | Status: DC | PRN

## 2017-09-04 MED ORDER — LACTATED RINGERS IV SOLN
INTRAVENOUS | Status: DC
  Administered 2017-09-04: 18:00:00 via INTRAVENOUS

## 2017-09-04 MED ORDER — ACETAMINOPHEN 500 MG PO TABS
1000.0000 mg | ORAL_TABLET | Freq: Three times a day (TID) | ORAL | Status: DC
  Administered 2017-09-04 – 2017-09-05 (×2): 1000 mg via ORAL
  Filled 2017-09-04 (×2): qty 2

## 2017-09-04 MED ORDER — ATORVASTATIN CALCIUM 40 MG PO TABS: 40.0000 mg | ORAL_TABLET | Freq: Every day | ORAL | Status: DC

## 2017-09-04 MED ORDER — FENTANYL CITRATE (PF) 100 MCG/2ML IJ SOLN
INTRAMUSCULAR | Status: DC | PRN
  Administered 2017-09-04 (×2): 50 ug via INTRAVENOUS

## 2017-09-04 MED ORDER — DEXAMETHASONE SODIUM PHOSPHATE 10 MG/ML IJ SOLN
INTRAMUSCULAR | Status: DC | PRN
  Administered 2017-09-04: 10 mg via INTRAVENOUS

## 2017-09-04 MED ORDER — SODIUM CHLORIDE 0.9 % IV SOLN
1.0000 g | Freq: Once | INTRAVENOUS | Status: AC
  Administered 2017-09-04: 1 g via INTRAVENOUS
  Filled 2017-09-04: qty 1

## 2017-09-04 MED ORDER — SENNOSIDES-DOCUSATE SODIUM 8.6-50 MG PO TABS
1.0000 | ORAL_TABLET | Freq: Two times a day (BID) | ORAL | Status: DC
  Administered 2017-09-04 – 2017-09-05 (×2): 1 via ORAL
  Filled 2017-09-04 (×2): qty 1

## 2017-09-04 MED ORDER — MIDAZOLAM HCL 5 MG/5ML IJ SOLN
INTRAMUSCULAR | Status: DC | PRN
  Administered 2017-09-04 (×2): 1 mg via INTRAVENOUS

## 2017-09-04 MED ORDER — OXYCODONE HCL 5 MG PO TABS: 5.0000 mg | ORAL_TABLET | ORAL | Status: DC | PRN

## 2017-09-04 MED ORDER — SODIUM CHLORIDE 0.9 % IR SOLN
Status: DC | PRN
  Administered 2017-09-04: 9000 mL

## 2017-09-04 MED ORDER — STERILE WATER FOR IRRIGATION IR SOLN
Status: DC | PRN
  Administered 2017-09-04: 500 mL

## 2017-09-04 SURGICAL SUPPLY — 18 items
BAG URINE DRAINAGE (UROLOGICAL SUPPLIES) ×3 IMPLANT
BAG URO CATCHER STRL LF (MISCELLANEOUS) ×3 IMPLANT
CATH HEMA 3WAY 30CC 24FR COUDE (CATHETERS) ×3 IMPLANT
COVER FOOTSWITCH UNIV (MISCELLANEOUS) ×3 IMPLANT
COVER SURGICAL LIGHT HANDLE (MISCELLANEOUS) IMPLANT
ELECT REM PT RETURN 15FT ADLT (MISCELLANEOUS) ×3 IMPLANT
EVACUATOR MICROVAS BLADDER (UROLOGICAL SUPPLIES) IMPLANT
GLOVE BIOGEL M STRL SZ7.5 (GLOVE) ×3 IMPLANT
GOWN STRL REUS W/TWL LRG LVL3 (GOWN DISPOSABLE) ×6 IMPLANT
HOLDER FOLEY CATH W/STRAP (MISCELLANEOUS) ×6 IMPLANT
LOOP CUT BIPOLAR 24F LRG (ELECTROSURGICAL) ×3 IMPLANT
MANIFOLD NEPTUNE II (INSTRUMENTS) ×3 IMPLANT
PACK CYSTO (CUSTOM PROCEDURE TRAY) ×3 IMPLANT
SET ASPIRATION TUBING (TUBING) IMPLANT
SYRINGE IRR TOOMEY STRL 70CC (SYRINGE) ×3 IMPLANT
TUBING CONNECTING 10 (TUBING) ×2 IMPLANT
TUBING CONNECTING 10' (TUBING) ×1
TUBING UROLOGY SET (TUBING) IMPLANT

## 2017-09-04 NOTE — Transfer of Care (Signed)
Immediate Anesthesia Transfer of Care Note  Patient: Billy Fuller  Procedure(s) Performed: CYSTOSCOPY WITH FULGERATION AND CLOT EVACUATION (N/A )  Patient Location: PACU  Anesthesia Type:Spinal  Level of Consciousness: awake, alert  and oriented  Airway & Oxygen Therapy: Patient Spontanous Breathing  Post-op Assessment: Report given to RN and Post -op Vital signs reviewed and stable  Post vital signs: Reviewed and stable  Last Vitals:  Vitals:   09/04/17 1700  BP: 137/83  Pulse: 87  Resp: 18  Temp: 36.5 C  SpO2: 99%    Last Pain:  Vitals:   09/04/17 1700  TempSrc: Oral         Complications: No apparent anesthesia complications

## 2017-09-04 NOTE — Anesthesia Preprocedure Evaluation (Addendum)
Anesthesia Evaluation  Patient identified by MRN, date of birth, ID band Patient awake    Reviewed: Allergy & Precautions, H&P , Patient's Chart, lab work & pertinent test results, reviewed documented beta blocker date and time   Airway Mallampati: II  TM Distance: >3 FB Neck ROM: full    Dental no notable dental hx.    Pulmonary former smoker,    Pulmonary exam normal breath sounds clear to auscultation       Cardiovascular  Rhythm:regular Rate:Normal     Neuro/Psych    GI/Hepatic   Endo/Other    Renal/GU      Musculoskeletal   Abdominal   Peds  Hematology   Anesthesia Other Findings   Reproductive/Obstetrics                             Anesthesia Physical Anesthesia Plan  ASA: III  Anesthesia Plan: Spinal   Post-op Pain Management:    Induction:   PONV Risk Score and Plan: 2 and Dexamethasone, Ondansetron and Treatment may vary due to age or medical condition  Airway Management Planned: Natural Airway  Additional Equipment:   Intra-op Plan:   Post-operative Plan:   Informed Consent: I have reviewed the patients History and Physical, chart, labs and discussed the procedure including the risks, benefits and alternatives for the proposed anesthesia with the patient or authorized representative who has indicated his/her understanding and acceptance.   Dental Advisory Given  Plan Discussed with: CRNA and Surgeon  Anesthesia Plan Comments: ( )       Anesthesia Quick Evaluation

## 2017-09-04 NOTE — Anesthesia Procedure Notes (Signed)
Spinal  Patient location during procedure: OR Staffing Anesthesiologist: Lyndle Herrlich, MD Spinal Block Patient position: sitting Prep: DuraPrep Patient monitoring: heart rate, blood pressure and continuous pulse ox Approach: right paramedian Location: L3-4 Injection technique: single-shot Needle Needle type: Sprotte  Needle gauge: 24 G Needle length: 9 cm Assessment Sensory level: T8 Additional Notes Spinal Dosage in OR  .75% Bupivicaine ml       1.4

## 2017-09-04 NOTE — H&P (Signed)
Billy Fuller is an 72 y.o. male.    Chief Complaint:  Pre-op Cystoscopy with Clot Evacuation  HPI:   1 - Gross Hematuria / Clot Retention - new large gross hematuria with clots 08/2017. Known h/o metastatic prostate cancer s/p surgery and radiation. Attempts at office irrigation unsuccesful.  2 - Metastatic Prostate Cancer - h/o prostatectomy then adjuvnat radiation around 2011. PSA recurrence with most recent imaging (axumen PET 2019) with Rt iliac and peri-aortic non-bulky adenopathy. Now on resumed androgen deprivation.  PMH sig for CVA / ASA.  Today "Billy Fuller" is seen to proceed with operative cysto / clot evacuation / fulgeration for clot retention. Last meal 7AM.   Past Medical History:  Diagnosis Date  . Cataract    eye implants  . ED (erectile dysfunction)   . Nephrolithiasis   . Prostate CA ([place]) 08/06/09   recurrent  . Stroke ([place])   . Wears dentures    upper    Past Surgical History:  Procedure Laterality Date  . ROBOT ASSISTED LAPAROSCOPIC RADICAL PROSTATECTOMY     10/06/09    Family History  Problem Relation Age of Onset  . Cancer Mother        breast  . Kidney failure Father   . Prostate cancer Father    Social History:  reports that he quit smoking about 38 years ago. he has never used smokeless tobacco. He reports that he drinks alcohol. He reports that he does not use drugs.  Allergies: No Known Allergies  No medications prior to admission.    No results found for this or any previous visit (from the past 48 hour(s)). No results found.  Review of Systems  Constitutional: Negative for chills and fever.  HENT: Negative.   Eyes: Negative.   Respiratory: Negative.   Cardiovascular: Negative.   Gastrointestinal: Negative.   Genitourinary: Positive for hematuria and urgency.  Musculoskeletal: Negative.   Skin: Negative.   Neurological: Negative.   Endo/Heme/Allergies: Negative.   Psychiatric/Behavioral: Negative.     There were no vitals taken  for this visit. Physical Exam  Constitutional: He appears well-developed.  HENT:  Head: Normocephalic.  Hard of hearing.   Neck: Normal range of motion.  Cardiovascular: Normal rate.  Respiratory: Effort normal.  GI: Soft.  Genitourinary: Penis normal.  Genitourinary Comments: Catheter in place with scant bloody drianage.   Musculoskeletal: Normal range of motion.  Neurological: He is alert.  Skin: Skin is warm.  Psychiatric: He has a normal mood and affect.     Assessment/Plan  Proceed as planned with cysto / clot evacuation / fulgeration for diagnostic and theraputic intent. Most likely this is radiation cystitis. Admit for observation post-op and serial labs to verify Hgb stability before DC.     Alexis Frock, MD 09/04/2017, 4:34 PM

## 2017-09-04 NOTE — Brief Op Note (Signed)
09/04/2017  6:34 PM  PATIENT:  Billy Fuller  72 y.o. male  PRE-OPERATIVE DIAGNOSIS:  clot retention, hematuria  POST-OPERATIVE DIAGNOSIS:  clot retention, hematuria  PROCEDURE:  Procedure(s): CYSTOSCOPY WITH FULGERATION AND CLOT EVACUATION (N/A)  SURGEON:  Surgeon(s) and Role:    * Alexis Frock, MD - Primary  PHYSICIAN ASSISTANT:   ASSISTANTS: none   ANESTHESIA:   spinal  EBL: 17cc old clot, 50cc new blood  BLOOD ADMINISTERED:none  DRAINS: 70F 3 way hematuria catheter to NS irrigation   LOCAL MEDICATIONS USED:  NONE  SPECIMEN:  No Specimen  DISPOSITION OF SPECIMEN:  N/A  COUNTS:  YES  TOURNIQUET:  * No tourniquets in log *  DICTATION: .Other Dictation: Dictation Number [id number]  PLAN OF CARE: Admit for overnight observation  PATIENT DISPOSITION:  PACU - hemodynamically stable.   Delay start of Pharmacological VTE agent (>24hrs) due to surgical blood loss or risk of bleeding: yes

## 2017-09-04 NOTE — Anesthesia Procedure Notes (Signed)
Date/Time: 09/04/2017 6:10 PM Performed by: Talbot Grumbling, CRNA Oxygen Delivery Method: Simple face mask

## 2017-09-05 ENCOUNTER — Encounter (HOSPITAL_COMMUNITY): Payer: Self-pay | Admitting: Urology

## 2017-09-05 DIAGNOSIS — C61 Malignant neoplasm of prostate: Secondary | ICD-10-CM | POA: Diagnosis not present

## 2017-09-05 DIAGNOSIS — R310 Gross hematuria: Secondary | ICD-10-CM | POA: Diagnosis not present

## 2017-09-05 DIAGNOSIS — N3289 Other specified disorders of bladder: Secondary | ICD-10-CM | POA: Diagnosis not present

## 2017-09-05 LAB — HEMOGLOBIN AND HEMATOCRIT, BLOOD
HEMATOCRIT: 39.1 % (ref 39.0–52.0)
Hemoglobin: 13.8 g/dL (ref 13.0–17.0)

## 2017-09-05 MED ORDER — TRAMADOL HCL 50 MG PO TABS: 50.0000 mg | ORAL_TABLET | Freq: Four times a day (QID) | ORAL | 0 refills | Status: DC | PRN

## 2017-09-05 MED ORDER — SENNOSIDES-DOCUSATE SODIUM 8.6-50 MG PO TABS: 1.0000 | ORAL_TABLET | Freq: Two times a day (BID) | ORAL | 0 refills | Status: DC

## 2017-09-05 NOTE — Discharge Summary (Signed)
Physician Discharge Summary  Patient ID: XZAVIOR REINIG MRN: [id number] DOB/AGE: 1946/02/19 72 y.o.  Admit date: 09/04/2017 Discharge date: 09/05/2017  Admission Diagnoses: Clot Urinary Retention  Discharge Diagnoses:  Active Problems:   Radiation cystitis   Discharged Condition: good  Hospital Course:  Pt directly admitted from Urology office 09/04/17 for clot urinary retention. He was taken urgently to OR for cystoscopy, clot evacuation and fulgeration of severe radiation cystitis bleeding ulcers. He was monitored on very light bladder irrigatino overnight. By POD 1, the day of discharge, his Hgb is stable at >13, pain controlled on PO meds, urine completely clear, and felt to be adequate for discharge without catheter.   Consults: None  Significant Diagnostic Studies: labs: as per above.   Treatments: surgery: as per above.   Discharge Exam: Blood pressure 131/78, pulse 73, temperature 97.9 F (36.6 C), temperature source Oral, resp. rate 18, height 5\' 5"  (1.651 m), weight 74.8 kg (165 lb), SpO2 98 %. General appearance: alert, cooperative, appears stated age and very pleasant Eyes: conjunctivae/corneas clear. PERRL, EOM's intact. Fundi benign. Ears: normal TM's and external ear canals both ears Nose: Nares normal. Septum midline. Mucosa normal. No drainage or sinus tenderness. Neck: supple, symmetrical, trachea midline Back: symmetric, no curvature. ROM normal. No CVA tenderness. Resp: non-labored on room air.  Cardio: Nl rate GI: soft, non-tender; bowel sounds normal; no masses,  no organomegaly Male genitalia: normal, 3 way catheter with clear yellow urine off irrigation. Removed.  Extremities: extremities normal, atraumatic, no cyanosis or edema Pulses: 2+ and symmetric Skin: Skin color, texture, turgor normal. No rashes or lesions Lymph nodes: Cervical, supraclavicular, and axillary nodes normal. Neurologic: Grossly normal  Disposition: 06-Home-Health Care  Svc   Allergies as of 09/05/2017   No Known Allergies     Medication List    TAKE these medications   aspirin 325 MG tablet Take 1 tablet (325 mg total) by mouth daily.   atorvastatin 40 MG tablet Commonly known as:  LIPITOR Take 1 tablet (40 mg total) by mouth daily at 6 PM.   senna-docusate 8.6-50 MG tablet Commonly known as:  Senokot-S Take 1 tablet by mouth 2 (two) times daily. While taking strong pain meds to prevent constipation.   traMADol 50 MG tablet Commonly known as:  ULTRAM Take 1 tablet (50 mg total) by mouth every 6 (six) hours as needed for moderate pain or severe pain. Post-operatively        SignedAlexis Frock 09/05/2017, 6:38 AM

## 2017-09-05 NOTE — Op Note (Signed)
NAME:  Billy Fuller, Billy Fuller NO.:  [id number]  MEDICAL RECORD NO.:  [id number]  LOCATION:  [facility]                         FACILITY:  [facility]  PHYSICIAN:  Alexis Frock, MD     DATE OF BIRTH:  03-24-1946  DATE OF PROCEDURE:  09/04/2017                              OPERATIVE REPORT   PREOPERATIVE DIAGNOSES:  Clot urinary retention, history of prostate cancer.  POSTOPERATIVE DIAGNOSES:  Clot urinary retention, history of prostate cancer, radiation cystitis.  PROCEDURE:  Cystoscopy with clot evacuation and fulguration of bleeders.  ESTIMATED BLOOD LOSS:  Approximately 300 cc old formed clot, approximately 50 cc new blood loss.  SPECIMENS: 1. Very large volume formed clot in urinary bladder, estimated 300 cc. 2. Severe radiation cystitis changes throughout the bladder mostly at     the bladder neck. 3. Resolution of significant portion of hematuria following clot     evacuation, fulguration.  DRAINS:  22-French three-way Foley catheter, 20 cc sterile water in the balloon, connected with normal saline irrigation and efflux nearly clear.  INDICATIONS:  Billy Fuller is a pleasant 72 year old gentleman with long course of prostate cancer that is now unfortunately metastatic.  He has had prostatectomy as well as postsurgical radiation and now on hormone therapy as well as prior oral oncolytics.  He was found today on workup of severe obstructive voiding to be in clot urinary retention, here was attempt at office with management of the clot evacuation; however, this was not successful.  It was felt that operative intervention would be warranted with goal of determining source of hematuria and correcting. Informed consent was obtained and placed in the medical record.  This is being done under regional anesthesia.  PROCEDURE IN DETAIL:  The patient being Billy Fuller, was verified. Procedure being cystoscopy with clot evacuation and fulguration of bleeders was confirmed.   Procedure was carried out.  Time-out was performed.  Intravenous antibiotics were administered.  Regional anesthesia had been introduced and was appropriate.  He was placed into a medium lithotomy position.  A sterile field was created by prepping and draping the patient's penis, perineum and proximal thighs using iodine.  Next, cystourethroscopy was performed using a 26-French resectoscope sheath with visual obturator.  Inspection of anterior and posterior urethra revealed absence of prostate.  Inspection of the urinary bladder revealed a very large volume of formed clot.  This was irrigated with a Toomey syringe, approximately 300 cc of old clot was removed, set aside for discard.  Repeat cystoscopic examination of the urinary bladder revealed severe radiation cystitis changes throughout the urinary bladder, mostly centered in the bladder neck.  Ureteral orifices were directly visualized using resectoscope loop with coagulation current.  Very careful systematic coagulation was performed of the bladder neck radiation cystitis areas, taking exquisite care to avoid injury to the ureteral orifices, which did not occur.  Additional coagulation current was applied to the base of the bladder and lateral sidewalls.  There were minimal radiation changes to the dome and anterior surface.  Following these maneuvers, there was very good hemostasis, no evidence of any bladder injury.  Ureteral orifices remained visibly patent.  The resectoscope was then exchanged for 22- [place] three-way  Foley catheter, 20 cc sterile water in the balloon, this was connected to normal saline irrigation.  Efflux was essentially very light pink to clear at 1-2 drops per second and the procedure was terminated.  The patient tolerated the procedure well.  There were no immediate periprocedural complications.  The patient was taken to the postanesthesia care unit in stable condition with plan for overnight  observation.          ______________________________ Alexis Frock, MD     TM/MEDQ  D:  09/04/2017  T:  09/05/2017  Job:  [id number]

## 2017-09-05 NOTE — Discharge Instructions (Signed)
1 - You may have urinary urgency (bladder spasms) and bloody urine on / off x few weeks. This is normal.  2 - Call MD or go to ER for fever >102, severe pain / nausea / vomiting not relieved by medications, or acute change in medical status  

## 2017-09-05 NOTE — Progress Notes (Signed)
Pt voided 125cc's of clear light pink urine.  Went over all discharge papers, all questions answered.  AVS and prescriptions given.  Pt wheeled out via NT

## 2017-09-06 NOTE — Anesthesia Postprocedure Evaluation (Signed)
Anesthesia Post Note  Patient: Billy Fuller  Procedure(s) Performed: CYSTOSCOPY WITH FULGERATION AND CLOT EVACUATION (N/A )     Patient location during evaluation: PACU Anesthesia Type: Spinal Level of consciousness: awake Pain management: satisfactory to patient Vital Signs Assessment: post-procedure vital signs reviewed and stable Respiratory status: spontaneous breathing Cardiovascular status: blood pressure returned to baseline Postop Assessment: no headache and spinal receding Anesthetic complications: no    Last Vitals:  Vitals:   09/05/17 0111 09/05/17 0605  BP: 126/66 131/78  Pulse: 80 73  Resp: 18 18  Temp: 36.8 C 36.6 C  SpO2: 96% 98%    Last Pain:  Vitals:   09/05/17 0626  TempSrc:   PainSc: 0-No pain                 Mckey,Mortimer Bair EDWARD

## 2017-09-12 DIAGNOSIS — C61 Malignant neoplasm of prostate: Secondary | ICD-10-CM | POA: Diagnosis not present

## 2017-09-12 DIAGNOSIS — Z5111 Encounter for antineoplastic chemotherapy: Secondary | ICD-10-CM | POA: Diagnosis not present

## 2017-10-10 DIAGNOSIS — C61 Malignant neoplasm of prostate: Secondary | ICD-10-CM | POA: Diagnosis not present

## 2017-10-10 DIAGNOSIS — Z5111 Encounter for antineoplastic chemotherapy: Secondary | ICD-10-CM | POA: Diagnosis not present

## 2017-10-16 DIAGNOSIS — C61 Malignant neoplasm of prostate: Secondary | ICD-10-CM | POA: Diagnosis not present

## 2017-10-16 DIAGNOSIS — R300 Dysuria: Secondary | ICD-10-CM | POA: Diagnosis not present

## 2017-10-16 DIAGNOSIS — R3912 Poor urinary stream: Secondary | ICD-10-CM | POA: Diagnosis not present

## 2017-11-16 DIAGNOSIS — C61 Malignant neoplasm of prostate: Secondary | ICD-10-CM | POA: Diagnosis not present

## 2017-11-21 DIAGNOSIS — N3040 Irradiation cystitis without hematuria: Secondary | ICD-10-CM | POA: Diagnosis not present

## 2017-11-21 DIAGNOSIS — R350 Frequency of micturition: Secondary | ICD-10-CM | POA: Diagnosis not present

## 2017-11-21 DIAGNOSIS — R300 Dysuria: Secondary | ICD-10-CM | POA: Diagnosis not present

## 2017-11-21 DIAGNOSIS — C61 Malignant neoplasm of prostate: Secondary | ICD-10-CM | POA: Diagnosis not present

## 2017-11-22 ENCOUNTER — Inpatient Hospital Stay: Payer: PPO | Attending: Oncology | Admitting: Oncology

## 2017-11-22 ENCOUNTER — Telehealth: Payer: Self-pay

## 2017-11-22 VITALS — BP 135/77 | HR 86 | Temp 98.2°F | Resp 18 | Ht 65.0 in | Wt 168.6 lb

## 2017-11-22 DIAGNOSIS — Z9079 Acquired absence of other genital organ(s): Secondary | ICD-10-CM | POA: Insufficient documentation

## 2017-11-22 DIAGNOSIS — Z923 Personal history of irradiation: Secondary | ICD-10-CM | POA: Diagnosis not present

## 2017-11-22 DIAGNOSIS — E291 Testicular hypofunction: Secondary | ICD-10-CM | POA: Insufficient documentation

## 2017-11-22 DIAGNOSIS — C61 Malignant neoplasm of prostate: Secondary | ICD-10-CM | POA: Diagnosis not present

## 2017-11-22 NOTE — Progress Notes (Signed)
Hematology and Oncology Follow Up Visit  Billy Fuller [id number] 01/12/1946 72 y.o. 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  He was initially diagnosed in 2011 with Gleason score was 3+4 = 7.   Prior Therapy:  Status post radical prostatectomy in 2011. He received adjuvant radiation therapy at that time.   He developed biochemical recurrence with a PSA up to 13 and 2014. He was treated as a part of a clinical trial with ADT and Zytiga. His PSA remained undetectable for the last 3 years.  Last hormone treatment was in 2015.   His PSA on 03/28/2017 was 5.86. In July 2018 his PSA was 2.59. He underwent PET scan on 03/13/2017 which showed low to moderate radiotracer activity within the right external iliac lymph node and 2 adjacent left periaortic lymph node.  Current therapy:  Androgen deprivation therapy started in January 2019.  He is currently receiving Firmagon under the care of Dr. [name].    Interim History: Billy Fuller is here for a follow-up visit.  Since last visit, he reports feeling reasonably well.  He developed clot retention and required hospitalization and cystoscopy completed by Dr. [name] in March 2019.  The symptoms have resolved although he does report periodic dysuria.  He denies any bone pain or pathological fractures.  He denies any constitutional symptoms.  Continues to tolerate androgen deprivation with the form of Firmagon without issues.   He does not report any headaches, blurry vision, syncope or seizures. He does not report any fevers, chills or sweats. He does not report any cough, wheezing or hemoptysis. He does not report any nausea, vomiting or abdominal pain. He does not report any frequency urgency or hesitancy. He does not report any arthralgias or myalgias. He does not report any petechiae or rash.  He denies any  lymphadenopathy or easy bruising.  Remaining review of systems is negative.    Medications: I have reviewed the patient's current medications.  Current Outpatient Medications  Medication Sig Dispense Refill  . aspirin 325 MG tablet Take 1 tablet (325 mg total) by mouth daily. 30 tablet 0  . atorvastatin (LIPITOR) 40 MG tablet Take 1 tablet (40 mg total) by mouth daily at 6 PM. 30 tablet 0   No current facility-administered medications for this visit.      Allergies: No Known Allergies  Past Medical History, Surgical history, Social history, and Family History were reviewed and updated.  Marland Kitchen Physical Exam: Blood pressure 135/77, pulse 86, temperature 98.2 F (36.8 C), temperature source Oral, resp. rate 18, height 5\' 5"  (1.651 m), weight 168 lb 9.6 oz (76.5 kg), SpO2 100 %. ECOG: 0 General appearance: Well-appearing gentleman without distress. Head: Atraumatic without abnormalities. Oropharynx: Without any thrush or ulcers. Eyes: Pulls are equal and round reactive to light. Lymph nodes: No lymphadenopathy noted in the cervical, supraclavicular, and axillary nodes Heart: Regular rate and rhythm without any murmurs or gallops. Lung: There to auscultation without any rhonchi, wheezes or dullness to percussion. Abdomin: Soft, nontender without any rebound or guarding. Musculoskeletal: No joint deformity or effusion.   Lab Results: Lab Results  Component Value Date   WBC 5.8 09/04/2017   HGB 13.8 09/05/2017   HCT 39.1 09/05/2017   MCV 89.8 09/04/2017   PLT 180 09/04/2017     Chemistry      Component Value Date/Time   NA 137 06/01/2016 0400  K 4.1 06/01/2016 0400   CL 105 06/01/2016 0400   CO2 24 06/01/2016 0400   BUN 13 06/01/2016 0400   CREATININE 1.02 06/01/2016 0400      Component Value Date/Time   CALCIUM 8.8 (L) 06/01/2016 0400   ALKPHOS 53 05/31/2016 0454   AST 21 05/31/2016 0454   ALT 15 (L) 05/31/2016 0454   BILITOT 0.6 05/31/2016 0454        Impression  and Plan:   72 year old man with the following issues:  1.  Castration-sensitive prostate cancer with disease into the pelvic adenopathy.  He is currently receiving deprivation therapy only with excellent response in his PSA.  The rationale for using additional therapy was reviewed again with the patient today.  That therapy will be in the form of Zytiga, Xtandi or systemic chemotherapy.  Risks and benefits of all these agents were reviewed again and the potential benefit.  After discussion today, he elected to defer that option to a later date.  He is enjoying a reasonable quality of life that does not want to interfere with it further.  For the time being, I recommended continuing androgen deprivation alone and we will continue to assess him periodically.  2.  Androgen deprivation: This is to be continued indefinitely.  He has no difficulties or issues related to that.  3.  Prognosis: He has incurable disease but disease that can be palliated for extended period of time.  Aggressive therapy is warranted moving forward.  4.  Follow-up: We will be in 6 months to follow his progress.  15  minutes was spent with the patient face-to-face today.  More than 50% of time was dedicated to patient counseling, education and coordinating his future plan of care including alternative treatment options.   Zola Button, MD 5/23/20199:48 AM

## 2017-11-22 NOTE — Telephone Encounter (Signed)
Printed avs and calender of upcoming appointment. Per 5/23 los 

## 2017-11-27 ENCOUNTER — Emergency Department (HOSPITAL_COMMUNITY)
Admission: EM | Admit: 2017-11-27 | Discharge: 2017-11-27 | Disposition: A | Discharge status: Home / Self Care | Payer: PPO | Attending: Emergency Medicine | Admitting: Emergency Medicine

## 2017-11-27 ENCOUNTER — Encounter (HOSPITAL_COMMUNITY): Payer: Self-pay | Admitting: Emergency Medicine

## 2017-11-27 DIAGNOSIS — Z7982 Long term (current) use of aspirin: Secondary | ICD-10-CM | POA: Insufficient documentation

## 2017-11-27 DIAGNOSIS — Z8546 Personal history of malignant neoplasm of prostate: Secondary | ICD-10-CM | POA: Insufficient documentation

## 2017-11-27 DIAGNOSIS — Z87891 Personal history of nicotine dependence: Secondary | ICD-10-CM | POA: Insufficient documentation

## 2017-11-27 DIAGNOSIS — Z79899 Other long term (current) drug therapy: Secondary | ICD-10-CM | POA: Diagnosis not present

## 2017-11-27 DIAGNOSIS — R339 Retention of urine, unspecified: Secondary | ICD-10-CM | POA: Diagnosis not present

## 2017-11-27 LAB — I-STAT CHEM 8, ED
BUN: 21 mg/dL — ABNORMAL HIGH (ref 6–20)
CHLORIDE: 107 mmol/L (ref 101–111)
Calcium, Ion: 1.12 mmol/L — ABNORMAL LOW (ref 1.15–1.40)
Creatinine, Ser: 0.9 mg/dL (ref 0.61–1.24)
GLUCOSE: 153 mg/dL — AB (ref 65–99)
HEMATOCRIT: 39 % (ref 39.0–52.0)
Hemoglobin: 13.3 g/dL (ref 13.0–17.0)
POTASSIUM: 3.8 mmol/L (ref 3.5–5.1)
SODIUM: 141 mmol/L (ref 135–145)
TCO2: 22 mmol/L (ref 22–32)

## 2017-11-27 LAB — URINALYSIS, ROUTINE W REFLEX MICROSCOPIC
BILIRUBIN URINE: NEGATIVE
GLUCOSE, UA: NEGATIVE mg/dL
KETONES UR: NEGATIVE mg/dL
NITRITE: NEGATIVE
PH: 5 (ref 5.0–8.0)
PROTEIN: NEGATIVE mg/dL
Specific Gravity, Urine: 1.019 (ref 1.005–1.030)

## 2017-11-27 LAB — BASIC METABOLIC PANEL
Anion gap: 10 (ref 5–15)
BUN: 24 mg/dL — ABNORMAL HIGH (ref 6–20)
CALCIUM: 8.9 mg/dL (ref 8.9–10.3)
CO2: 21 mmol/L — AB (ref 22–32)
Chloride: 109 mmol/L (ref 101–111)
Creatinine, Ser: 0.89 mg/dL (ref 0.61–1.24)
GFR calc Af Amer: >60 mL/min (ref >60)
GLUCOSE: 146 mg/dL — AB (ref 65–99)
Potassium: 4 mmol/L (ref 3.5–5.1)
Sodium: 140 mmol/L (ref 135–145)

## 2017-11-27 MED ORDER — SODIUM CHLORIDE 0.9 % IV BOLUS
500.0000 mL | Freq: Once | INTRAVENOUS | Status: AC
  Administered 2017-11-27: 500 mL via INTRAVENOUS

## 2017-11-27 MED ORDER — LIDOCAINE HCL URETHRAL/MUCOSAL 2 % EX GEL
1.0000 "application " | Freq: Once | CUTANEOUS | Status: AC
  Administered 2017-11-27: 1 via URETHRAL
  Filled 2017-11-27: qty 5

## 2017-11-27 NOTE — ED Provider Notes (Signed)
[facility] DEPT Provider Note  CSN: [id number] Arrival date & time: 11/27/17  3329   History   Chief Complaint Chief Complaint  Patient presents with  . Urinary Retention    HPI Billy Fuller is a 72 y.o. male with a medical history of prostate cancer s/p radical prostatectomy, radiation cystitis and CVA/TIA who presented to the ED for urinary retention. He states that he has not urinated since 6pm on 11/26/17 approx. 12 hours before presenting to the ED. At baseline, patient has dysuria and hesitancy due to radiation cystitis. Associated symptoms: abdominal discomfort. Denies fever, chills, diaphoresis, penile/scrotal swelling or pain.  Additional history obtained by medical chart. Patient was hospitalized in 08/2017 for clot urinary retention where he was taken to the OR for cystoscopy and clot evaluation.  Past Medical History:  Diagnosis Date  . Cataract    eye implants  . ED (erectile dysfunction)   . Nephrolithiasis   . Prostate CA ([place]) 08/06/09   recurrent  . Stroke [facility])     Patient Active Problem List   Diagnosis Date Noted  . Radiation cystitis 09/04/2017  . Acute CVA (cerebrovascular accident) ([place]) 05/31/2016  . Cerebrovascular accident (CVA) ([place])   . Left-sided weakness 05/30/2016  . TIA (transient ischemic attack) 05/30/2016  . Impacted cerumen of right ear 05/01/2016  . Prostate cancer ([place]) 05/16/2011  . Prostate CA ([place]) 08/06/2009    Past Surgical History:  Procedure Laterality Date  . CYSTOSCOPY WITH FULGERATION N/A 09/04/2017   Procedure: [place] AND CLOT EVACUATION;  Surgeon: Alexis Frock, MD;  Location: [facility] ORS;  Service: Urology;  Laterality: N/A;  . ROBOT ASSISTED LAPAROSCOPIC RADICAL PROSTATECTOMY     10/06/09        Home Medications    Prior to Admission medications   Medication Sig Start Date End Date Taking? Authorizing Provider  ALOE PO Take 3 capsules by mouth 3 (three) times daily.    Yes [provider]  aspirin 325 MG tablet Take 1 tablet (325 mg total) by mouth daily. 06/02/16  Yes Luiz Blare Y, DO  atorvastatin (LIPITOR) 40 MG tablet Take 1 tablet (40 mg total) by mouth daily at 6 PM. 06/01/16  Yes Katheren Shams, DO    Family History Family History  Problem Relation Age of Onset  . Cancer Mother        breast  . Kidney failure Father   . Prostate cancer Father     Social History Social History   Tobacco Use  . Smoking status: Former Smoker    Last attempt to quit: 05/31/1979    Years since quitting: 38.5  . Smokeless tobacco: Never Used  Substance Use Topics  . Alcohol use: Yes    Comment: 4 alcoholic beverages/week  . Drug use: No     Allergies   Patient has no known allergies.   Review of Systems Review of Systems  Constitutional: Negative.  Negative for chills, diaphoresis and fever.  Respiratory: Negative.  Negative for shortness of breath.   Cardiovascular: Negative.  Negative for chest pain and palpitations.  Gastrointestinal: Positive for abdominal pain.  Endocrine: Negative.   Genitourinary: Positive for decreased urine volume, difficulty urinating and dysuria. Negative for discharge, frequency and testicular pain.  Musculoskeletal: Negative.   Skin: Negative.   Hematological: Negative.      Physical Exam Updated Vital Signs BP 113/70   Pulse 77   Temp 97.7 F (36.5 C) (Oral)   Resp 16  SpO2 98%   Physical Exam  Constitutional: He appears well-developed and well-nourished. He appears listless.  Patient is in obvious discomfort due to pain for retention.  Cardiovascular: Normal rate, regular rhythm, normal heart sounds and intact distal pulses.  Pulmonary/Chest: Effort normal and breath sounds normal.  Abdominal: Soft. Normal appearance and bowel sounds are normal. There is generalized tenderness. There is no rigidity and no guarding. No hernia.  Genitourinary: Testes normal and penis normal. Uncircumcised. No  penile erythema or penile tenderness. No discharge found.  Neurological: He appears listless.  Skin: Skin is warm and dry. He is not diaphoretic.  Psychiatric: His speech is normal. His mood appears anxious.  Nursing note and vitals reviewed.    ED Treatments / Results  Labs (all labs ordered are listed, but only abnormal results are displayed) Labs Reviewed  URINALYSIS, ROUTINE W REFLEX MICROSCOPIC - Abnormal; Notable for the following components:      Result Value   Color, Urine GREEN (*)    APPearance HAZY (*)    Hgb urine dipstick LARGE (*)    Leukocytes, UA MODERATE (*)    RBC / HPF >50 (*)    WBC, UA >50 (*)    Bacteria, UA RARE (*)    All other components within normal limits  BASIC METABOLIC PANEL - Abnormal; Notable for the following components:   CO2 21 (*)    Glucose, Bld 146 (*)    BUN 24 (*)    All other components within normal limits  I-STAT CHEM 8, ED - Abnormal; Notable for the following components:   BUN 21 (*)    Glucose, Bld 153 (*)    Calcium, Ion 1.12 (*)    All other components within normal limits  URINE CULTURE   EKG None  Radiology No results found.  Procedures Procedures (including critical care time)  Medications Ordered in ED Medications  lidocaine (XYLOCAINE) 2 % jelly 1 application (1 application Urethral Given 11/27/17 0725)  sodium chloride 0.9 % bolus 500 mL (0 mLs Intravenous Stopped 11/27/17 0902)    Initial Impression / Assessment and Plan / ED Course  Triage vital signs and the nursing notes have been reviewed.  Pertinent labs & imaging results that were available during care of the patient were reviewed and considered in medical decision making (see chart for details).  Clinical Course as of Nov 27 908  Tue Nov 27, 2017  0738 Nurses able to perform cath with success. 963mL measured on output. Patient is resting comfortably. Physical exam conducted after urination. Abdomen non-tender to palpation. BS x4. Given pt's history,  he will be discharged with Foley.   [GM]  [phone] UA findings consistent with UTI and yeast is also present.   [GM]    Clinical Course User Index [GM] Billy Fuller, [place]   Patient initially presented in acute discomfort, but has normal vital signs. No systemic s/s to suggest infection. Cath performed in the ED and 988ml urine was able to be collected. UA was consistent with UTI; however, given patient's medical history and baseline dysuria and hesistancy, it is likely that his baseline UAs appear this way. Case was discussed with his outpatient urology group to discuss if abnormal UA needs to be treated. Per urology, pt's last UA last week was consistent with what was seen in the ED today. Their recommendation was for [place] not to treat the UTI in the ED and to have the patient follow-up with them outpatient.   Final Clinical Impressions(s) /  ED Diagnoses  1. Urinary Retention. Case discussed with his outpatient urology group. Abnormal UA is consistent with last UA results. Recommended not to prescribe antibiotic at this time. Urine culture sent. Foley cath placed in ED and he will be discharged with it.  Dispo: Home. After thorough clinical evaluation, this patient is determined to be medically stable and can be safely discharged with the previously mentioned treatment and/or outpatient follow-up/referral(s). At this time, there are no other apparent medical conditions that require further screening, evaluation or treatment.  Final diagnoses:  Urinary retention    ED Discharge Orders    None        Junita Push 11/27/17 0998    Daleen Bo, MD 11/27/17 [phone]

## 2017-11-27 NOTE — Discharge Instructions (Addendum)
I called your urologist's office. They are aware that you were seen by [place] today. Please follow-up with them to schedule an appointment ASAP.

## 2017-11-27 NOTE — ED Triage Notes (Signed)
Patient here from home with complaints of urinary retention. Last able to urinate at 6pm last night. Prostate Cancer patient.

## 2017-11-27 NOTE — ED Notes (Signed)
Post catherization pt had 931ml urinary output in bag

## 2017-11-27 NOTE — ED Notes (Addendum)
Bladder scan reported as 663ml

## 2017-11-27 NOTE — ED Provider Notes (Signed)
  Face-to-face evaluation   History: He presents for evaluation of inability to void.  He has ongoing symptoms of dysuria, and urinary frequency, possibly worse in the last several days.  He was treated with unknown antibiotic 3 weeks ago for similar symptoms.  Status post radiation treatment for presumed recurrence of prostate cancer.  No recent fever, chills, vomiting or dizziness.  Physical exam: Alert, calm, cooperative.  Nontoxic appearance.  Medical decision making-urinary retention with obstruction, and abnormal urine with moderate blood both red and white cells.  No clots in catheter drainage.  Ongoing chronic symptoms of dysuria, without overt sign of infection, i.e. no fever, or chills.  Urine culture ordered, will discuss appropriate follow-up treatment with urology, prior to discharge.  Medical screening examination/treatment/procedure(s) were conducted as a shared visit with non-physician practitioner(s) and myself.  I personally evaluated the patient during the encounter    Daleen Bo, MD 11/27/17 [phone]

## 2017-11-28 DIAGNOSIS — Z5111 Encounter for antineoplastic chemotherapy: Secondary | ICD-10-CM | POA: Diagnosis not present

## 2017-11-28 DIAGNOSIS — C61 Malignant neoplasm of prostate: Secondary | ICD-10-CM | POA: Diagnosis not present

## 2017-11-28 LAB — URINE CULTURE: CULTURE: NO GROWTH

## 2017-12-03 DIAGNOSIS — R338 Other retention of urine: Secondary | ICD-10-CM | POA: Diagnosis not present

## 2017-12-10 ENCOUNTER — Inpatient Hospital Stay (HOSPITAL_COMMUNITY)
Admission: EM | Admit: 2017-12-10 | Discharge: 2017-12-14 | DRG: 670 | Disposition: A | Discharge status: Home / Self Care | Payer: PPO | Attending: Urology | Admitting: Urology

## 2017-12-10 ENCOUNTER — Encounter (HOSPITAL_COMMUNITY): Payer: Self-pay

## 2017-12-10 DIAGNOSIS — Z9079 Acquired absence of other genital organ(s): Secondary | ICD-10-CM | POA: Diagnosis not present

## 2017-12-10 DIAGNOSIS — R300 Dysuria: Secondary | ICD-10-CM | POA: Diagnosis not present

## 2017-12-10 DIAGNOSIS — Z8546 Personal history of malignant neoplasm of prostate: Secondary | ICD-10-CM | POA: Diagnosis not present

## 2017-12-10 DIAGNOSIS — Z79899 Other long term (current) drug therapy: Secondary | ICD-10-CM | POA: Diagnosis not present

## 2017-12-10 DIAGNOSIS — R319 Hematuria, unspecified: Secondary | ICD-10-CM | POA: Diagnosis not present

## 2017-12-10 DIAGNOSIS — Y842 Radiological procedure and radiotherapy as the cause of abnormal reaction of the patient, or of later complication, without mention of misadventure at the time of the procedure: Secondary | ICD-10-CM | POA: Diagnosis present

## 2017-12-10 DIAGNOSIS — C61 Malignant neoplasm of prostate: Secondary | ICD-10-CM | POA: Diagnosis not present

## 2017-12-10 DIAGNOSIS — R339 Retention of urine, unspecified: Secondary | ICD-10-CM | POA: Diagnosis present

## 2017-12-10 DIAGNOSIS — Z8673 Personal history of transient ischemic attack (TIA), and cerebral infarction without residual deficits: Secondary | ICD-10-CM | POA: Diagnosis not present

## 2017-12-10 DIAGNOSIS — Z87891 Personal history of nicotine dependence: Secondary | ICD-10-CM

## 2017-12-10 DIAGNOSIS — R1030 Lower abdominal pain, unspecified: Secondary | ICD-10-CM | POA: Diagnosis not present

## 2017-12-10 DIAGNOSIS — R310 Gross hematuria: Secondary | ICD-10-CM | POA: Diagnosis present

## 2017-12-10 DIAGNOSIS — N3041 Irradiation cystitis with hematuria: Secondary | ICD-10-CM | POA: Diagnosis present

## 2017-12-10 DIAGNOSIS — R3915 Urgency of urination: Secondary | ICD-10-CM | POA: Diagnosis not present

## 2017-12-10 DIAGNOSIS — N3289 Other specified disorders of bladder: Secondary | ICD-10-CM | POA: Diagnosis not present

## 2017-12-10 LAB — CBC WITH DIFFERENTIAL/PLATELET
BASOS PCT: 0 %
Basophils Absolute: 0 10*3/uL (ref 0.0–0.1)
Eosinophils Absolute: 0 10*3/uL (ref 0.0–0.7)
Eosinophils Relative: 0 %
HEMATOCRIT: 37.8 % — AB (ref 39.0–52.0)
HEMOGLOBIN: 13 g/dL (ref 13.0–17.0)
Lymphocytes Relative: 13 %
Lymphs Abs: 1.4 10*3/uL (ref 0.7–4.0)
MCH: 30.2 pg (ref 26.0–34.0)
MCHC: 34.4 g/dL (ref 30.0–36.0)
MCV: 87.7 fL (ref 78.0–100.0)
Monocytes Absolute: 0.7 10*3/uL (ref 0.1–1.0)
Monocytes Relative: 6 %
NEUTROS ABS: 9 10*3/uL — AB (ref 1.7–7.7)
NEUTROS PCT: 81 %
Platelets: 251 10*3/uL (ref 150–400)
RBC: 4.31 MIL/uL (ref 4.22–5.81)
RDW: 12.1 % (ref 11.5–15.5)
WBC: 11.1 10*3/uL — AB (ref 4.0–10.5)

## 2017-12-10 LAB — BASIC METABOLIC PANEL
ANION GAP: 13 (ref 5–15)
BUN: 16 mg/dL (ref 6–20)
CALCIUM: 9.1 mg/dL (ref 8.9–10.3)
CHLORIDE: 106 mmol/L (ref 101–111)
CO2: 21 mmol/L — AB (ref 22–32)
Creatinine, Ser: 1.12 mg/dL (ref 0.61–1.24)
GFR calc non Af Amer: >60 mL/min (ref >60)
Glucose, Bld: 157 mg/dL — ABNORMAL HIGH (ref 65–99)
Potassium: 4.2 mmol/L (ref 3.5–5.1)
SODIUM: 140 mmol/L (ref 135–145)

## 2017-12-10 MED ORDER — ATORVASTATIN CALCIUM 40 MG PO TABS
40.0000 mg | ORAL_TABLET | Freq: Every day | ORAL | Status: DC
  Administered 2017-12-12 – 2017-12-13 (×2): 40 mg via ORAL
  Filled 2017-12-10 (×2): qty 1

## 2017-12-10 MED ORDER — ONDANSETRON HCL 4 MG/2ML IJ SOLN
4.0000 mg | Freq: Once | INTRAMUSCULAR | Status: AC
  Administered 2017-12-10: 4 mg via INTRAVENOUS
  Filled 2017-12-10: qty 2

## 2017-12-10 MED ORDER — ONDANSETRON HCL 4 MG/2ML IJ SOLN: 4.0000 mg | Freq: Once | INTRAMUSCULAR | Status: DC

## 2017-12-10 MED ORDER — HYDROMORPHONE HCL 1 MG/ML IJ SOLN
1.0000 mg | Freq: Once | INTRAMUSCULAR | Status: AC
  Administered 2017-12-10: 1 mg via INTRAVENOUS
  Filled 2017-12-10: qty 1

## 2017-12-10 MED ORDER — FENTANYL CITRATE (PF) 100 MCG/2ML IJ SOLN
50.0000 ug | INTRAMUSCULAR | Status: DC | PRN
  Administered 2017-12-10: 50 ug via INTRAVENOUS
  Filled 2017-12-10: qty 2

## 2017-12-10 MED ORDER — MORPHINE SULFATE (PF) 4 MG/ML IV SOLN
4.0000 mg | Freq: Once | INTRAVENOUS | Status: AC
  Administered 2017-12-10: 4 mg via INTRAVENOUS
  Filled 2017-12-10: qty 1

## 2017-12-10 NOTE — ED Notes (Signed)
Continuous bladder irrigation set up by Urologist.

## 2017-12-10 NOTE — ED Notes (Signed)
Urology at bedside.

## 2017-12-10 NOTE — ED Provider Notes (Signed)
[facility] DEPT Provider Note   CSN: [id number] Arrival date & time: 12/10/17  1854     History   Chief Complaint Chief Complaint  Patient presents with  . Urinary Retention    HPI Billy Fuller is a 72 y.o. male with history of prostate cancer s/p radical prostatectomy and radiation, radiation cystitis, currently on Firmagon, stroke is here for inability to void urine since 4 PM today.  Associated symptoms include pain at the tip of the penis, urgency.  He has chronic dysuria. Patient was at urology office today and had Foley catheter changed, when he arrived home he noticed the output was frankly bloody and thicker in appearance, he was unable to void and was concerned about developing a clot.  States that he has had clot formation 3 times since March.  He required urgent OR for cystoscopy, clot evacuation by Dr. [name] on March 2019.   HPI  Past Medical History:  Diagnosis Date  . Cataract    eye implants  . ED (erectile dysfunction)   . Nephrolithiasis   . Prostate CA ([place]) 08/06/09   recurrent  . Stroke [facility])     Patient Active Problem List   Diagnosis Date Noted  . Radiation cystitis 09/04/2017  . Acute CVA (cerebrovascular accident) ([place]) 05/31/2016  . Cerebrovascular accident (CVA) ([place])   . Left-sided weakness 05/30/2016  . TIA (transient ischemic attack) 05/30/2016  . Impacted cerumen of right ear 05/01/2016  . Prostate cancer ([place]) 05/16/2011  . Prostate CA ([place]) 08/06/2009    Past Surgical History:  Procedure Laterality Date  . CYSTOSCOPY WITH FULGERATION N/A 09/04/2017   Procedure: [place] AND CLOT EVACUATION;  Surgeon: Alexis Frock, MD;  Location: [facility] ORS;  Service: Urology;  Laterality: N/A;  . ROBOT ASSISTED LAPAROSCOPIC RADICAL PROSTATECTOMY     10/06/09        Home Medications    Prior to Admission medications   Medication Sig Start Date End Date Taking? Authorizing Provider  atorvastatin  (LIPITOR) 40 MG tablet Take 1 tablet (40 mg total) by mouth daily at 6 PM. 06/01/16  Yes Katheren Shams, DO  aspirin 325 MG tablet Take 1 tablet (325 mg total) by mouth daily. Patient not taking: Reported on 12/10/2017 06/02/16   Katheren Shams, DO    Family History Family History  Problem Relation Age of Onset  . Cancer Mother        breast  . Kidney failure Father   . Prostate cancer Father     Social History Social History   Tobacco Use  . Smoking status: Former Smoker    Last attempt to quit: 05/31/1979    Years since quitting: 38.5  . Smokeless tobacco: Never Used  Substance Use Topics  . Alcohol use: Yes    Comment: 4 alcoholic beverages/week  . Drug use: No     Allergies   Patient has no known allergies.   Review of Systems Review of Systems  Genitourinary: Positive for difficulty urinating, dysuria, hematuria and penile pain.  All other systems reviewed and are negative.    Physical Exam Updated Vital Signs BP (!) 154/81 (BP Location: Right Arm)   Pulse 97   Temp 97.7 F (36.5 C) (Oral)   Resp (!) 21   Ht 5\' 6"  (1.676 m)   Wt 74.8 kg (165 lb)   SpO2 98%   BMI 26.63 kg/m   Physical Exam  Constitutional: He is oriented to person, place, and  time. He appears well-developed and well-nourished. No distress.  Non toxic but looks uncomfortable.  HENT:  Head: Normocephalic and atraumatic.  Nose: Nose normal.  Moist mucous membranes   Eyes: Pupils are equal, round, and reactive to light. Conjunctivae and EOM are normal.  Neck: Normal range of motion.  Cardiovascular: Normal rate, regular rhythm, normal heart sounds and intact distal pulses.  No murmur heard. 2+ DP and radial pulses bilaterally. No LE edema.   Pulmonary/Chest: Effort normal and breath sounds normal.  Abdominal: Soft. Bowel sounds are normal. There is tenderness.  Mild suprapubic tenderness with guarding.  No rebound, rigidity, distention.  No CVA tenderness.  Negative Murphy's and  McBurney's  Genitourinary:  Genitourinary Comments: Blood at meatus.  Hematuria noted at Foley catheter.  Musculoskeletal: Normal range of motion.  Neurological: He is alert and oriented to person, place, and time.  Skin: Skin is warm and dry. Capillary refill takes less than 2 seconds.  Psychiatric: He has a normal mood and affect. His behavior is normal. Judgment and thought content normal.  Nursing note and vitals reviewed.    ED Treatments / Results  Labs (all labs ordered are listed, but only abnormal results are displayed) Labs Reviewed  CBC WITH DIFFERENTIAL/PLATELET - Abnormal; Notable for the following components:      Result Value   WBC 11.1 (*)    HCT 37.8 (*)    Neutro Abs 9.0 (*)    All other components within normal limits  BASIC METABOLIC PANEL - Abnormal; Notable for the following components:   CO2 21 (*)    Glucose, Bld 157 (*)    All other components within normal limits  URINE CULTURE  URINALYSIS, ROUTINE W REFLEX MICROSCOPIC    EKG None  Radiology No results found.  Procedures Procedures (including critical care time)  Medications Ordered in ED Medications  fentaNYL (SUBLIMAZE) injection 50 mcg (50 mcg Intravenous Given 12/10/17 2139)  ondansetron (ZOFRAN) injection 4 mg (has no administration in time range)  atorvastatin (LIPITOR) tablet 40 mg (has no administration in time range)  HYDROmorphone (DILAUDID) injection 1 mg (1 mg Intravenous Given 12/10/17 2000)  ondansetron (ZOFRAN) injection 4 mg (4 mg Intravenous Given 12/10/17 2028)  morphine 4 MG/ML injection 4 mg (4 mg Intravenous Given 12/10/17 2028)     Initial Impression / Assessment and Plan / ED Course  I have reviewed the triage vital signs and the nursing notes.  Pertinent labs & imaging results that were available during my care of the patient were reviewed by me and considered in my medical decision making (see chart for details).  Clinical Course as of Dec 11 2214  Mon Dec 10, 2017    2104 Spoke to Dr Alyson Ingles who will see pt in ER    [CG]  2106 Unclear about bladder scan. Reportedly initial bladder scan prior to cath change not done, after initial cath insertion bladder scan shows 88 cc in bladder.  Since, pt has had another change into a triple lumen cath, 120 cc irrigated but only 60 cc taken out with long clot noted.      [CG]    Clinical Course User Index [CG] Kinnie Feil, PA-C    2030: RN changed catheter but unable to draw urine, she noted a long clot that broke off and unable to be removed.  Will consult urology.  Final Clinical Impressions(s) / ED Diagnoses   2215: labs reviewed and remarkable for WBC 11.1, otherwise WNL. Pt evaluated by urology (Dr  Mckenzie) who will admit patient.  Final diagnoses:  Urinary retention    ED Discharge Orders    None       Arlean Hopping 12/10/17 2216    Milton Ferguson, MD 12/11/17 [phone]

## 2017-12-10 NOTE — ED Notes (Addendum)
Pts previously inserted leg bag and cath was removed in ER by this writer, 251ml bloody liquid in leg bag.  Multiple clots present in bag and tubing. Bladder scan showed 25ml urine present.  Reinserted a triple lumen cath and irrigated with 131ml sterile water, using sterile technique.  11ml bright red bloody liquid return with multiple clots.  Would not return any more urine with syringe.  PA Claudia aware.  Pt medicated for pain c/o the end of his penis.

## 2017-12-10 NOTE — ED Notes (Signed)
Patient catheter irrigated with 136mL of sterile water, but only 12mL of blood tinged water and clots was returned. Patient reports he is still in 10/10 pain.

## 2017-12-11 ENCOUNTER — Other Ambulatory Visit: Payer: Self-pay

## 2017-12-11 DIAGNOSIS — R310 Gross hematuria: Secondary | ICD-10-CM | POA: Diagnosis present

## 2017-12-11 LAB — BASIC METABOLIC PANEL
ANION GAP: 8 (ref 5–15)
BUN: 13 mg/dL (ref 6–20)
CALCIUM: 8.6 mg/dL — AB (ref 8.9–10.3)
CHLORIDE: 105 mmol/L (ref 101–111)
CO2: 25 mmol/L (ref 22–32)
Creatinine, Ser: 0.88 mg/dL (ref 0.61–1.24)
GFR calc non Af Amer: >60 mL/min (ref >60)
Glucose, Bld: 153 mg/dL — ABNORMAL HIGH (ref 65–99)
POTASSIUM: 4 mmol/L (ref 3.5–5.1)
Sodium: 138 mmol/L (ref 135–145)

## 2017-12-11 LAB — CBC
HCT: 34.5 % — ABNORMAL LOW (ref 39.0–52.0)
HEMOGLOBIN: 12 g/dL — AB (ref 13.0–17.0)
MCH: 30.3 pg (ref 26.0–34.0)
MCHC: 34.8 g/dL (ref 30.0–36.0)
MCV: 87.1 fL (ref 78.0–100.0)
Platelets: 175 10*3/uL (ref 150–400)
RBC: 3.96 MIL/uL — AB (ref 4.22–5.81)
RDW: 12.3 % (ref 11.5–15.5)
WBC: 10.8 10*3/uL — AB (ref 4.0–10.5)

## 2017-12-11 MED ORDER — ENSURE ENLIVE PO LIQD: 237.0000 mL | Freq: Two times a day (BID) | ORAL | Status: DC

## 2017-12-11 MED ORDER — SODIUM CHLORIDE 0.9 % IV SOLN
INTRAVENOUS | Status: DC
  Administered 2017-12-11 (×2): via INTRAVENOUS

## 2017-12-11 MED ORDER — SODIUM CHLORIDE 0.9 % IR SOLN
3000.0000 mL | Status: DC
  Administered 2017-12-11 – 2017-12-13 (×8): 3000 mL

## 2017-12-11 MED ORDER — OXYCODONE-ACETAMINOPHEN 5-325 MG PO TABS
1.0000 | ORAL_TABLET | ORAL | Status: DC | PRN
  Administered 2017-12-11 – 2017-12-14 (×8): 2 via ORAL
  Filled 2017-12-11 (×9): qty 2

## 2017-12-11 MED ORDER — ZOLPIDEM TARTRATE 5 MG PO TABS: 5.0000 mg | ORAL_TABLET | Freq: Every evening | ORAL | Status: DC | PRN

## 2017-12-11 MED ORDER — BELLADONNA ALKALOIDS-OPIUM 16.2-60 MG RE SUPP: 1.0000 | Freq: Four times a day (QID) | RECTAL | Status: DC | PRN

## 2017-12-11 MED ORDER — DIPHENHYDRAMINE HCL 12.5 MG/5ML PO ELIX: 12.5000 mg | ORAL_SOLUTION | Freq: Four times a day (QID) | ORAL | Status: DC | PRN

## 2017-12-11 MED ORDER — ONDANSETRON HCL 4 MG/2ML IJ SOLN: 4.0000 mg | INTRAMUSCULAR | Status: DC | PRN

## 2017-12-11 MED ORDER — HYDROMORPHONE HCL 1 MG/ML IJ SOLN
0.5000 mg | INTRAMUSCULAR | Status: DC | PRN
  Administered 2017-12-13 (×3): 1 mg via INTRAVENOUS
  Filled 2017-12-11 (×3): qty 1

## 2017-12-11 MED ORDER — DIPHENHYDRAMINE HCL 50 MG/ML IJ SOLN: 12.5000 mg | Freq: Four times a day (QID) | INTRAMUSCULAR | Status: DC | PRN

## 2017-12-11 NOTE — ED Notes (Signed)
ED TO INPATIENT HANDOFF REPORT  Name/Age/Gender Billy Fuller 72 y.o. male  Code Status Code Status History    Date Active Date Inactive Code Status Order ID Comments User Context   09/04/2017 2116 09/05/2017 1243 Full Code [id number]  Alexis Frock, MD Inpatient   05/30/2016 2108 06/01/2016 2117 Full Code [id number]  Everrett Coombe, MD Inpatient      Home/SNF/Other Home  Chief Complaint cath blocked  Level of Care/Admitting Diagnosis ED Disposition    ED Disposition Condition [place]: [facility] [100102]  Level of Care: Med-Surg [16]  Diagnosis: Gross hematuria [599.71.ICD-9-CM]  Admitting Physician: Cleon Gustin [[id number]]  Attending Physician: Cleon Gustin [[id number]]  Bed request comments: 4th floor  PT Class (Do Not Modify): Observation [104]  PT Acc Code (Do Not Modify): Observation [10022]       Medical History Past Medical History:  Diagnosis Date  . Cataract    eye implants  . ED (erectile dysfunction)   . Nephrolithiasis   . Prostate CA ([place]) 08/06/09   recurrent  . Stroke [facility])     Allergies No Known Allergies  IV Location/Drains/Wounds Patient Lines/Drains/Airways Status   Active Line/Drains/Airways    Name:   Placement date:   Placement time:   Site:   Days:   Peripheral IV 12/10/17   12/10/17    2005    -   1   Urethral Catheter Nilsa Nutting RN Coude 16 Fr.   11/27/17    0711    Coude   14   Urethral Catheter  Triple-lumen 16 Fr.   12/10/17    2005    Triple-lumen   1          Labs/Imaging Results for orders placed or performed during the hospital encounter of 12/10/17 (from the past 48 hour(s))  CBC with Differential     Status: Abnormal   Collection Time: 12/10/17  7:45 PM  Result Value Ref Range   WBC 11.1 (H) 4.0 - 10.5 K/uL   RBC 4.31 4.22 - 5.81 MIL/uL   Hemoglobin 13.0 13.0 - 17.0 g/dL   HCT 37.8 (L) 39.0 - 52.0 %   MCV 87.7 78.0 - 100.0 fL   MCH 30.2 26.0 - 34.0 pg   MCHC  34.4 30.0 - 36.0 g/dL   RDW 12.1 11.5 - 15.5 %   Platelets 251 150 - 400 K/uL   Neutrophils Relative % 81 %   Neutro Abs 9.0 (H) 1.7 - 7.7 K/uL   Lymphocytes Relative 13 %   Lymphs Abs 1.4 0.7 - 4.0 K/uL   Monocytes Relative 6 %   Monocytes Absolute 0.7 0.1 - 1.0 K/uL   Eosinophils Relative 0 %   Eosinophils Absolute 0.0 0.0 - 0.7 K/uL   Basophils Relative 0 %   Basophils Absolute 0.0 0.0 - 0.1 K/uL    Comment: Performed at [facility], [place] [address], [place], [place] [address]  Basic metabolic panel     Status: Abnormal   Collection Time: 12/10/17  7:45 PM  Result Value Ref Range   Sodium 140 135 - 145 mmol/L   Potassium 4.2 3.5 - 5.1 mmol/L   Chloride 106 101 - 111 mmol/L   CO2 21 (L) 22 - 32 mmol/L   Glucose, Bld 157 (H) 65 - 99 mg/dL   BUN 16 6 - 20 mg/dL   Creatinine, Ser 1.12 0.61 - 1.24 mg/dL   Calcium 9.1 8.9 -  10.3 mg/dL   GFR calc non Af Amer >60 >60 mL/min   GFR calc Af Amer >60 >60 mL/min    Comment: (NOTE) The eGFR has been calculated using the CKD EPI equation. This calculation has not been validated in all clinical situations. eGFR's persistently <60 mL/min signify possible Chronic Kidney Disease.    Anion gap 13 5 - 15    Comment: Performed at [facility], [place] [address] [place], [place] [address]   No results found.  Pending Labs Unresulted Labs (From admission, onward)   Start     Ordered   12/10/17 1944  Urinalysis, Routine w reflex microscopic  Once,   R     12/10/17 1943   12/10/17 1944  Urine culture  STAT,   STAT     12/10/17 1943   Signed and Held  CBC  Tomorrow morning,   R     Signed and Held   Signed and Held  Basic metabolic panel  Tomorrow morning,   R     Signed and Held      Vitals/Pain Today's Vitals   12/10/17 2100 12/10/17 2130 12/10/17 2200 12/10/17 2231  BP: 136/82 (!) 154/81 (!) 124/98 127/67  Pulse: 92 97 91 89  Resp: 19 19 (!) 26 16  Temp:      TempSrc:      SpO2: 93% 94% 100%  97%  Weight:      Height:        Isolation Precautions No active isolations  Medications Medications  fentaNYL (SUBLIMAZE) injection 50 mcg (50 mcg Intravenous Given 12/10/17 2139)  ondansetron (ZOFRAN) injection 4 mg (has no administration in time range)  atorvastatin (LIPITOR) tablet 40 mg (has no administration in time range)  HYDROmorphone (DILAUDID) injection 1 mg (1 mg Intravenous Given 12/10/17 2000)  ondansetron (ZOFRAN) injection 4 mg (4 mg Intravenous Given 12/10/17 2028)  morphine 4 MG/ML injection 4 mg (4 mg Intravenous Given 12/10/17 2028)    Mobility walks

## 2017-12-11 NOTE — Progress Notes (Signed)
Nutrition Brief Note  Patient identified on the Malnutrition Screening Tool (MST) Report  Wt Readings from Last 15 Encounters:  12/10/17 165 lb (74.8 kg)  11/22/17 168 lb 9.6 oz (76.5 kg)  09/04/17 165 lb (74.8 kg)  07/26/17 167 lb 6.4 oz (75.9 kg)  04/06/17 163 lb 9.6 oz (74.2 kg)  04/04/17 165 lb 6.4 oz (75 kg)  08/03/16 169 lb (76.7 kg)  05/30/16 171 lb (77.6 kg)  05/01/16 170 lb 3.2 oz (77.2 kg)  01/31/12 164 lb 9.6 oz (74.7 kg)  08/07/11 162 lb 1.6 oz (73.5 kg)  05/22/11 167 lb 3.2 oz (75.8 kg)  05/16/11 165 lb 6.4 oz (75 kg)  05/02/11 166 lb 6.4 oz (75.5 kg)    Body mass index is 26.63 kg/m. Patient meets criteria for overweight based on current BMI. Skin WDL. Patient with hx of prostate cancer s/p radical prostatectomy and radiation, radiation cystitis, on [place] at this time. He also has hx of stroke. Patient presented to the ED last night d/t not urinating since Friday (6/7) afternoon and having associated pain and feelings of urgency. Notes indicate chronic dysuria.    Current diet order is Regular, patient is consuming approximately 100% of meals at this time.  Medications reviewed.  Labs reviewed; Ca: 8.6 mg/dL. IVF: NS @ 50 mL/hr.   Ensure Enlive had been ordered per ONS Protocol at the time of admission. Patient has tried this supplement in the past and does not like it; he does not wish to receive it. Ensure d/c'ed by RD. No additional nutrition interventions warranted at this time. If nutrition issues arise, please consult RD.      Jarome Matin, MS, RD, LDN, [facility] Inpatient Clinical Dietitian Pager # [phone] After hours/weekend pager # [phone]

## 2017-12-11 NOTE — H&P (Signed)
Urology Admission H&P  Chief Complaint: gross hematuria  History of Present Illness: [name] is a 72yo with a hx of metastatic prostate cancer, radiation cystitis and urinary retention who presented to the ER with worsening suprapubic pain and a poorly draining foley. The foley was replaced and did not drain properly and blood was draining from foley. Clot evacuation was performed in the ER and 400cc of clot was irrigated. His suprapibic and penile pain resolved. Urine was clear on slow drip CBI  Past Medical History:  Diagnosis Date  . Cataract    eye implants  . ED (erectile dysfunction)   . Nephrolithiasis   . Prostate CA ([place]) 08/06/09   recurrent  . Stroke [facility])    Past Surgical History:  Procedure Laterality Date  . CYSTOSCOPY WITH FULGERATION N/A 09/04/2017   Procedure: [place] AND CLOT EVACUATION;  Surgeon: Alexis Frock, MD;  Location: [facility] ORS;  Service: Urology;  Laterality: N/A;  . ROBOT ASSISTED LAPAROSCOPIC RADICAL PROSTATECTOMY     10/06/09    Home Medications:  Current Facility-Administered Medications  Medication Dose Route Frequency Provider Last Rate Last Dose  . 0.9 %  sodium chloride infusion   Intravenous Continuous Cleon Gustin, MD 50 mL/hr at 12/11/17 0223    . atorvastatin (LIPITOR) tablet 40 mg  40 mg Oral q1800 Harolyn Cocker, Candee Furbish, MD      . diphenhydrAMINE (BENADRYL) injection 12.5 mg  12.5 mg Intravenous Q6H PRN Runette Scifres, Candee Furbish, MD       Or  . diphenhydrAMINE (BENADRYL) 12.5 MG/5ML elixir 12.5 mg  12.5 mg Oral Q6H PRN Dajon Rowe, Candee Furbish, MD      . fentaNYL (SUBLIMAZE) injection 50 mcg  50 mcg Intravenous Q30 min PRN Milton Ferguson, MD   50 mcg at 12/10/17 2139  . HYDROmorphone (DILAUDID) injection 0.5-1 mg  0.5-1 mg Intravenous Q2H PRN Cleon Gustin, MD      . ondansetron (ZOFRAN) injection 4 mg  4 mg Intravenous Once Gibbons, Claudia J, PA-C      . ondansetron [facility]) injection 4 mg  4 mg Intravenous Q4H PRN  Rahima Fleishman, Candee Furbish, MD      . opium-belladonna (B&O SUPPRETTES) 16.2-60 MG suppository 1 suppository  1 suppository Rectal Q6H PRN Cleon Gustin, MD      . oxyCODONE-acetaminophen (PERCOCET/ROXICET) 5-325 MG per tablet 1-2 tablet  1-2 tablet Oral Q4H PRN Cleon Gustin, MD   2 tablet at 12/11/17 0847  . sodium chloride irrigation 0.9 % 3,000 mL  3,000 mL Irrigation Continuous Roemello Speyer, Candee Furbish, MD   3,000 mL at 12/11/17 0630  . zolpidem (AMBIEN) tablet 5 mg  5 mg Oral QHS PRN Ilario Dhaliwal, Candee Furbish, MD       Allergies: No Known Allergies  Family History  Problem Relation Age of Onset  . Cancer Mother        breast  . Kidney failure Father   . Prostate cancer Father    Social History:  reports that he quit smoking about 38 years ago. He has never used smokeless tobacco. He reports that he drinks alcohol. He reports that he does not use drugs.  Review of Systems  Gastrointestinal: Positive for abdominal pain.  Genitourinary: Positive for hematuria.  All other systems reviewed and are negative.   Physical Exam:  Vital signs in last 24 hours: Temp:  [97.7 F (36.5 C)-98.9 F (37.2 C)] 98 F (36.7 C) (06/11 1249) Pulse Rate:  [71-102] 71 (06/11 1249) Resp:  [  9-26] 18 (06/11 1249) BP: (113-158)/(58-98) 120/66 (06/11 1249) SpO2:  [93 %-100 %] 99 % (06/11 1249) Weight:  [74.8 kg (165 lb)] 74.8 kg (165 lb) (06/10 1925) Physical Exam  Constitutional: He appears well-developed and well-nourished.  HENT:  Head: Normocephalic and atraumatic.  Eyes: Pupils are equal, round, and reactive to light. EOM are normal.  Neck: Normal range of motion. No thyromegaly present.  Cardiovascular: Normal rate and regular rhythm.  Respiratory: Effort normal. No respiratory distress.  GI: Soft. He exhibits no distension. Hernia confirmed negative in the right inguinal area and confirmed negative in the left inguinal area.  Genitourinary: Testes normal and penis normal.  Musculoskeletal:  Normal range of motion. He exhibits no edema.  Lymphadenopathy:       Right: No inguinal adenopathy present.       Left: No inguinal adenopathy present.  Neurological: He is alert.  Skin: Skin is warm and dry.  Psychiatric: He has a normal mood and affect. His behavior is normal. Judgment and thought content normal.    Laboratory Data:  Results for orders placed or performed during the hospital encounter of 12/10/17 (from the past 24 hour(s))  CBC with Differential     Status: Abnormal   Collection Time: 12/10/17  7:45 PM  Result Value Ref Range   WBC 11.1 (H) 4.0 - 10.5 K/uL   RBC 4.31 4.22 - 5.81 MIL/uL   Hemoglobin 13.0 13.0 - 17.0 g/dL   HCT 37.8 (L) 39.0 - 52.0 %   MCV 87.7 78.0 - 100.0 fL   MCH 30.2 26.0 - 34.0 pg   MCHC 34.4 30.0 - 36.0 g/dL   RDW 12.1 11.5 - 15.5 %   Platelets 251 150 - 400 K/uL   Neutrophils Relative % 81 %   Neutro Abs 9.0 (H) 1.7 - 7.7 K/uL   Lymphocytes Relative 13 %   Lymphs Abs 1.4 0.7 - 4.0 K/uL   Monocytes Relative 6 %   Monocytes Absolute 0.7 0.1 - 1.0 K/uL   Eosinophils Relative 0 %   Eosinophils Absolute 0.0 0.0 - 0.7 K/uL   Basophils Relative 0 %   Basophils Absolute 0.0 0.0 - 0.1 K/uL  Basic metabolic panel     Status: Abnormal   Collection Time: 12/10/17  7:45 PM  Result Value Ref Range   Sodium 140 135 - 145 mmol/L   Potassium 4.2 3.5 - 5.1 mmol/L   Chloride 106 101 - 111 mmol/L   CO2 21 (L) 22 - 32 mmol/L   Glucose, Bld 157 (H) 65 - 99 mg/dL   BUN 16 6 - 20 mg/dL   Creatinine, Ser 1.12 0.61 - 1.24 mg/dL   Calcium 9.1 8.9 - 10.3 mg/dL   GFR calc non Af Amer >60 >60 mL/min   GFR calc Af Amer >60 >60 mL/min   Anion gap 13 5 - 15  CBC     Status: Abnormal   Collection Time: 12/11/17  5:01 AM  Result Value Ref Range   WBC 10.8 (H) 4.0 - 10.5 K/uL   RBC 3.96 (L) 4.22 - 5.81 MIL/uL   Hemoglobin 12.0 (L) 13.0 - 17.0 g/dL   HCT 34.5 (L) 39.0 - 52.0 %   MCV 87.1 78.0 - 100.0 fL   MCH 30.3 26.0 - 34.0 pg   MCHC 34.8 30.0 - 36.0  g/dL   RDW 12.3 11.5 - 15.5 %   Platelets 175 150 - 400 K/uL  Basic metabolic panel     Status: Abnormal  Collection Time: 12/11/17  5:01 AM  Result Value Ref Range   Sodium 138 135 - 145 mmol/L   Potassium 4.0 3.5 - 5.1 mmol/L   Chloride 105 101 - 111 mmol/L   CO2 25 22 - 32 mmol/L   Glucose, Bld 153 (H) 65 - 99 mg/dL   BUN 13 6 - 20 mg/dL   Creatinine, Ser 0.88 0.61 - 1.24 mg/dL   Calcium 8.6 (L) 8.9 - 10.3 mg/dL   GFR calc non Af Amer >60 >60 mL/min   GFR calc Af Amer >60 >60 mL/min   Anion gap 8 5 - 15   No results found for this or any previous visit (from the past 240 hour(s)). Creatinine: Recent Labs    12/10/17 1945 12/11/17 0501  CREATININE 1.12 0.88   Baseline Creatinine: 1  Impression/Assessment:  72yo with radiation cystitis and clot retention  Plan:  1. 3 way foley catheter placed and patient was irrigated to clear. CBI was initiated. The patient will be admitted for observation and is his urine does not clear we will take him to the OR for cystoscopy and fulgeration.   Nicolette Bang 12/11/2017, 3:17 PM

## 2017-12-11 NOTE — Care Management Obs Status (Signed)
[place] NOTIFICATION   Patient Details  Name: Billy Fuller MRN: [id number] Date of Birth: [birth year]   Medicare Observation Status Notification Given:  Yes    MahabirJuliann Pulse, RN 12/11/2017, 1:21 PM

## 2017-12-11 NOTE — ED Notes (Signed)
Security, Myriam Jacobson, approached triage saying there was a man in the waiting room who needed a nurse immediately and " was passing out".  I went to the waiting room and patient was c/o extreme pain saying his urologist had put a catheter and leg bag on him this afternoon but it was clogged and not draining. Pt stated he " felt full and couldn't pass urine through the tube".  Pt placed in Triage room with Dylan R.

## 2017-12-11 NOTE — Plan of Care (Signed)

## 2017-12-11 NOTE — ED Notes (Signed)
[facility] security, Billy Fuller, came to triage desk saying a man in the waiting room ws " passing out and needed a nurse immediately".  There was no nurse at the triage desk so I went to see if I could help him with DJ , tech.  The patient said he had a leg bag and foley placed at his Urologist office earlier that afternoon due to chemo systitis, and that his bag and cath was full of blood. He said he felt like he ws going to " explode and couldn't pass urine through the clogged tubing".  Pt was wheeled to triage room by DJ , tech.

## 2017-12-11 NOTE — Plan of Care (Signed)

## 2017-12-11 NOTE — ED Notes (Signed)
Pt was assigned to Res A by Triage RN Ferne Coe.  Pt taken via w/c to res A by this Probation officer.  Pt in extreme pain, yelling out.  Wife was on the phone talking to the Urologist saying " [facility] would not see her husband, and he was in a triage room".  I spoke to Carmon Sails, [place] about the patients c/o pain and blood filled foley bag and tubing. She gave me a v.o to replace the foley.  I called the OR to get a triple lumen foley for irrigation but was unable to reach the OR. The patients foley cath was removed and replaced with a 74 F single lumen cath. Tech Spencer L. Suggested a bladder scan which showed 66ml urine after the foley was replaced.  No output from this new foley.   Pt was triaged by [facility].

## 2017-12-12 ENCOUNTER — Other Ambulatory Visit: Payer: Self-pay | Admitting: Urology

## 2017-12-12 DIAGNOSIS — Z79899 Other long term (current) drug therapy: Secondary | ICD-10-CM | POA: Diagnosis not present

## 2017-12-12 DIAGNOSIS — Z8546 Personal history of malignant neoplasm of prostate: Secondary | ICD-10-CM | POA: Diagnosis not present

## 2017-12-12 DIAGNOSIS — R339 Retention of urine, unspecified: Secondary | ICD-10-CM | POA: Diagnosis present

## 2017-12-12 DIAGNOSIS — N3041 Irradiation cystitis with hematuria: Secondary | ICD-10-CM | POA: Diagnosis present

## 2017-12-12 DIAGNOSIS — Z87891 Personal history of nicotine dependence: Secondary | ICD-10-CM | POA: Diagnosis not present

## 2017-12-12 DIAGNOSIS — Z9079 Acquired absence of other genital organ(s): Secondary | ICD-10-CM | POA: Diagnosis not present

## 2017-12-12 DIAGNOSIS — Z8673 Personal history of transient ischemic attack (TIA), and cerebral infarction without residual deficits: Secondary | ICD-10-CM | POA: Diagnosis not present

## 2017-12-12 DIAGNOSIS — Y842 Radiological procedure and radiotherapy as the cause of abnormal reaction of the patient, or of later complication, without mention of misadventure at the time of the procedure: Secondary | ICD-10-CM | POA: Diagnosis present

## 2017-12-12 MED ORDER — ASPIRIN 325 MG PO TABS: 325.0000 mg | ORAL_TABLET | Freq: Every day | ORAL | 0 refills | Status: DC

## 2017-12-12 MED ORDER — CEFAZOLIN (ANCEF) 1 G IV SOLR
2.0000 g | INTRAVENOUS | Status: AC
  Administered 2017-12-13: 2 g
  Filled 2017-12-12: qty 2

## 2017-12-12 NOTE — Progress Notes (Signed)
Patient ID: Billy Fuller, male   DOB: [birth year], 72 y.o.   MRN: [id number]   Mr Orrico urine has cleared but it had darkened up earlier and he is concerned about recurrent bleeding.   After reviewing the options, I am going to take him for cystoscopy and possible fulguration tomorrow.   He will need to be set up for hyperbaric oxygen therapy after that.     I discussed the risks of the procedure in detail.

## 2017-12-12 NOTE — H&P (View-Only) (Signed)
Patient ID: Billy Fuller, male   DOB: 11-18-1945, 72 y.o.   MRN: [id number]   Mr Doenges urine has cleared but it had darkened up earlier and he is concerned about recurrent bleeding.   After reviewing the options, I am going to take him for cystoscopy and possible fulguration tomorrow.   He will need to be set up for hyperbaric oxygen therapy after that.     I discussed the risks of the procedure in detail.

## 2017-12-12 NOTE — Treatment Plan (Signed)
CBI clamped this morning at 8. Turned back on for simple episode of hematuria. Stopped around 2 hours ago. Urine now clear.   Will wait 2 more hours. If urine clear, will DC home. Advised nurse to hold discharge if patient has repeated bouts of hematuria and/or clot obstruction. Patient is worried about having to come back to the ED.   Will reassess at 4 pm if patient is still in house

## 2017-12-12 NOTE — Progress Notes (Signed)
Pateitn was seen by urology on-call this am. CBI was stopped. Around 930 the pt c/o pain 7/10 in bladder, scan completed 0 cc. Urine was assessed small amount of clotting with bloody urine. CBI turned back on to, pain medication administered. Dr Alyson Ingles updated.

## 2017-12-13 ENCOUNTER — Inpatient Hospital Stay (HOSPITAL_COMMUNITY): Payer: PPO | Admitting: Anesthesiology

## 2017-12-13 ENCOUNTER — Encounter (HOSPITAL_COMMUNITY): Payer: Self-pay

## 2017-12-13 ENCOUNTER — Encounter (HOSPITAL_COMMUNITY)
Admission: EM | Disposition: A | Discharge status: Home / Self Care | Payer: Self-pay | Source: Home / Self Care | Attending: Urology

## 2017-12-13 HISTORY — PX: CYSTOSCOPY WITH FULGERATION: SHX6638

## 2017-12-13 LAB — SURGICAL PCR SCREEN
MRSA, PCR: NEGATIVE
Staphylococcus aureus: NEGATIVE

## 2017-12-13 SURGERY — CYSTOSCOPY, WITH BLADDER FULGURATION
Anesthesia: General

## 2017-12-13 MED ORDER — MEPERIDINE HCL 50 MG/ML IJ SOLN
6.2500 mg | INTRAMUSCULAR | Status: DC | PRN
  Administered 2017-12-13: 12.5 mg via INTRAVENOUS

## 2017-12-13 MED ORDER — PROPOFOL 10 MG/ML IV BOLUS
INTRAVENOUS | Status: DC | PRN
  Administered 2017-12-13: 25 mg via INTRAVENOUS
  Administered 2017-12-13: 150 mg via INTRAVENOUS

## 2017-12-13 MED ORDER — FENTANYL CITRATE (PF) 100 MCG/2ML IJ SOLN
INTRAMUSCULAR | Status: AC
  Filled 2017-12-13: qty 2

## 2017-12-13 MED ORDER — DEXTROSE 5 % IV SOLN
5.0000 mg/kg | INTRAVENOUS | Status: AC
  Administered 2017-12-13: 370 mg via INTRAVENOUS
  Filled 2017-12-13: qty 9.25

## 2017-12-13 MED ORDER — PROPOFOL 10 MG/ML IV BOLUS
INTRAVENOUS | Status: AC
  Filled 2017-12-13: qty 20

## 2017-12-13 MED ORDER — PHENYLEPHRINE 40 MCG/ML (10ML) SYRINGE FOR IV PUSH (FOR BLOOD PRESSURE SUPPORT)
PREFILLED_SYRINGE | INTRAVENOUS | Status: DC | PRN
  Administered 2017-12-13: 80 ug via INTRAVENOUS

## 2017-12-13 MED ORDER — OXYCODONE-ACETAMINOPHEN 5-325 MG PO TABS: 1.0000 | ORAL_TABLET | Freq: Four times a day (QID) | ORAL | 0 refills | Status: DC | PRN

## 2017-12-13 MED ORDER — MUPIROCIN 2 % EX OINT: 1.0000 "application " | TOPICAL_OINTMENT | Freq: Two times a day (BID) | CUTANEOUS | Status: DC

## 2017-12-13 MED ORDER — CEPHALEXIN 500 MG PO CAPS
500.0000 mg | ORAL_CAPSULE | Freq: Four times a day (QID) | ORAL | Status: DC
  Administered 2017-12-13 – 2017-12-14 (×3): 500 mg via ORAL
  Filled 2017-12-13 (×3): qty 1

## 2017-12-13 MED ORDER — KCL IN DEXTROSE-NACL 20-5-0.45 MEQ/L-%-% IV SOLN
INTRAVENOUS | Status: DC
  Administered 2017-12-13 – 2017-12-14 (×2): via INTRAVENOUS
  Filled 2017-12-13 (×2): qty 1000

## 2017-12-13 MED ORDER — CEFAZOLIN SODIUM-DEXTROSE 2-4 GM/100ML-% IV SOLN
INTRAVENOUS | Status: AC
  Filled 2017-12-13: qty 100

## 2017-12-13 MED ORDER — PHENYLEPHRINE 40 MCG/ML (10ML) SYRINGE FOR IV PUSH (FOR BLOOD PRESSURE SUPPORT)
PREFILLED_SYRINGE | INTRAVENOUS | Status: AC
  Filled 2017-12-13: qty 10

## 2017-12-13 MED ORDER — MEPERIDINE HCL 50 MG/ML IJ SOLN
INTRAMUSCULAR | Status: AC
  Filled 2017-12-13: qty 1

## 2017-12-13 MED ORDER — SODIUM CHLORIDE 0.9 % IR SOLN
Status: DC | PRN
  Administered 2017-12-13: 9000 mL via INTRAVESICAL

## 2017-12-13 MED ORDER — LIDOCAINE 2% (20 MG/ML) 5 ML SYRINGE
INTRAMUSCULAR | Status: DC | PRN
  Administered 2017-12-13: 100 mg via INTRAVENOUS

## 2017-12-13 MED ORDER — FENTANYL CITRATE (PF) 100 MCG/2ML IJ SOLN
INTRAMUSCULAR | Status: AC
  Filled 2017-12-13: qty 4

## 2017-12-13 MED ORDER — LIDOCAINE 2% (20 MG/ML) 5 ML SYRINGE
INTRAMUSCULAR | Status: AC
  Filled 2017-12-13: qty 5

## 2017-12-13 MED ORDER — CEPHALEXIN 500 MG PO CAPS: 500.0000 mg | ORAL_CAPSULE | Freq: Three times a day (TID) | ORAL | 0 refills | Status: DC

## 2017-12-13 MED ORDER — MIDAZOLAM HCL 2 MG/2ML IJ SOLN
INTRAMUSCULAR | Status: AC
  Filled 2017-12-13: qty 2

## 2017-12-13 MED ORDER — ONDANSETRON HCL 4 MG/2ML IJ SOLN
INTRAMUSCULAR | Status: DC | PRN
  Administered 2017-12-13: 4 mg via INTRAVENOUS

## 2017-12-13 MED ORDER — FENTANYL CITRATE (PF) 100 MCG/2ML IJ SOLN
25.0000 ug | INTRAMUSCULAR | Status: DC | PRN
  Administered 2017-12-13: 50 ug via INTRAVENOUS
  Administered 2017-12-13: 25 ug via INTRAVENOUS
  Administered 2017-12-13: 50 ug via INTRAVENOUS
  Administered 2017-12-13: 25 ug via INTRAVENOUS

## 2017-12-13 MED ORDER — LACTATED RINGERS IV SOLN
INTRAVENOUS | Status: DC
  Administered 2017-12-13: 14:00:00 via INTRAVENOUS

## 2017-12-13 MED ORDER — ONDANSETRON HCL 4 MG/2ML IJ SOLN
INTRAMUSCULAR | Status: AC
  Filled 2017-12-13: qty 2

## 2017-12-13 MED ORDER — FENTANYL CITRATE (PF) 100 MCG/2ML IJ SOLN
INTRAMUSCULAR | Status: DC | PRN
  Administered 2017-12-13 (×4): 25 ug via INTRAVENOUS

## 2017-12-13 SURGICAL SUPPLY — 19 items
BAG URINE DRAINAGE (UROLOGICAL SUPPLIES) IMPLANT
BAG URO CATCHER STRL LF (MISCELLANEOUS) ×3 IMPLANT
CATH FOLEY 3WAY 30CC 22FR (CATHETERS) IMPLANT
CATH URET 5FR 28IN OPEN ENDED (CATHETERS) IMPLANT
COVER FOOTSWITCH UNIV (MISCELLANEOUS) ×2 IMPLANT
COVER SURGICAL LIGHT HANDLE (MISCELLANEOUS) ×1 IMPLANT
GLOVE SURG SS PI 8.0 STRL IVOR (GLOVE) ×10 IMPLANT
GOWN STRL REUS W/TWL XL LVL3 (GOWN DISPOSABLE) ×7 IMPLANT
HOLDER FOLEY CATH W/STRAP (MISCELLANEOUS) IMPLANT
LOOP CUT BIPOLAR 24F LRG (ELECTROSURGICAL) ×2 IMPLANT
MANIFOLD NEPTUNE II (INSTRUMENTS) ×3 IMPLANT
PACK CYSTO (CUSTOM PROCEDURE TRAY) ×3 IMPLANT
SET ASPIRATION TUBING (TUBING) ×1 IMPLANT
SUT ETHILON 3 0 PS 1 (SUTURE) IMPLANT
SYR 30ML LL (SYRINGE) IMPLANT
SYRINGE IRR TOOMEY STRL 70CC (SYRINGE) ×2 IMPLANT
TUBING CONNECTING 10 (TUBING) ×2 IMPLANT
TUBING CONNECTING 10' (TUBING) ×1
TUBING UROLOGY SET (TUBING) ×1 IMPLANT

## 2017-12-13 NOTE — Progress Notes (Signed)
Patient unable to void and bladder scan shows 323ml. Pt feels urge to void. Leta Baptist paged through [facility]. Verbal order received to place 16Fr straight tip cath and coude if necessary. Also verbal order received to hand irrigate if needed. When discussing with patient about inserting catheter he stated "oh no, I'm not having another one. Earlie Server would have to put me to sleep for that." Will re-scan patients bladder if unable to void.

## 2017-12-13 NOTE — Progress Notes (Signed)
Patient ID: Billy Fuller, male   DOB: 08-01-1945, 72 y.o.   MRN: [id number]   Billy Fuller continues to have pain post cystoscopy and fulguration for extensive radiation cystitis with necrosis this afternoon.   There was no bleeding at the end of the procedure and he had a good stream with compression of the bladder after the procedure.   He has not voided since surgery.   He denies lower abdominal pain but has marked penile pain.     BP (!) 177/84 (BP Location: Right Arm)   Pulse 88   Temp 97.9 F (36.6 C) (Oral)   Resp 18   Ht 5\' 6"  (1.676 m)   Wt 74.8 kg (165 lb)   SpO2 98%   BMI 26.63 kg/m   I will have him followed with bladder scans.   I have reviewed his pain meds and he has adequate meds ordered.  Plan for D/C in AM if voiding well.

## 2017-12-13 NOTE — Op Note (Signed)
Procedure: Cystoscopy with fulguration, 3 to 5 cm of the bladder neck and trigone.  Preop diagnosis: Radiation cystitis with hemorrhage.  Postop diagnosis: Same with extensive involvement of the bladder neck and trigone with associated necrosis.  Surgeon: Dr. Irine Seal.  Anesthesia: General.  Drains: None.  EBL: Minimal.  Specimen: None.  Complications: None.  Indications: Billy Fuller is a 72 year old white male with a history of prostate cancer that is been treated with radical prostatectomy in 2011 followed by salvage radiation therapy.  He now has recurrent disease with metastases to lymph nodes and is on androgen ablation therapy.  He has had recurrent issues with hematuria and clot retention related to radiation cystitis and has i undergone prior fulguration.  He was admitted to the hospital earlier this week with recurrent clot retention and now has persistent intermittent bleeding despite a large bore Foley with continuous bladder irrigation.  It was felt that cystoscopy and fulguration were indicated.  Procedure: He was given Ancef and gentamicin for antibiotic prophylaxis.  He was taken to the operating room where general anesthetic was induced.  He was placed in the lithotomy position and fitted with PAS hose.  His Foley catheter was removed and his perineum and genitalia were prepped with Betadine solution.  He was draped in usual sterile fashion.  Cystoscopy was performed using the 23 [place] scope and 30 degree lens.  Examination revealed a normal urethra.  The external sphincter was somewhat patulous with blanching mucosa.  The the area of the urethrovesical anastomosis had areas that appeared consistent with prior fulguration but other areas that had more florid neovascularity.  There was one area at the left lateral bladder neck that had pulsatile bleeding.  Inspection of the bladder was difficult because of the bleeding.  The cystoscope was removed and a 26 French  continuous-flow resectoscope sheath was passed using the visual obturator.  The obturator was replaced with an Beatrix Fetters handle with a bipolar loop and 30 degree len.  Further inspection demonstrated radiation changes of the trigone.  Ureteral orifices were not clearly identified.  There was a large area of exudative necrosis superior to the trigone on the right that appeared to be secondary to prior fulguration.  The active bleeder on the left bladder neck was fulgurated with the loop.  I then cleaned up the necrotic exudate from the area of prior fulguration but this started up some additional bleeding which required further fulguration.  Eventually near circumferential fulguration of the distal trigone and bladder neck as well as proximal urethrovesical anastomosis required fulguration to completely eliminate the active bleeding.  The total area of fulguration was approximately 3 x 5 cm.  Once the bleeding had been controlled further inspection revealed no tumors on the bladder wall and no stones however the bladder wall was diffusely erythematous but this was felt to be secondary to radiation changes and Foley catheter irritation.  Further inspection of the area of the urethrovesical anastomosis demonstrated a small light blue around/cylindrical structure that was approximately 3 mm in size but densely adherent to the surrounding tissues.  This appeared plastic and most consistent with a foreign body outside of the urethrovesical anastomosis.  I reviewed the operative note from the radical prostatectomy and no Weck clips were used and I could recall nothing that might have produced this residual material.  I did not feel that I could easily remove it without potentially aggravating further bleeding.  At this point there was no active bleeding.  The scope was  removed leaving a small amount of irrigation in the bladder and once the scope was out pressure on the bladder produced an excellent stream of clear  fluid.  It was felt that the catheter was not indicated at this point.  He was then taken down from the lithotomy position, his anesthetic was reversed and he was moved to the recovery room in stable condition.  There were no complications.

## 2017-12-13 NOTE — Anesthesia Postprocedure Evaluation (Signed)
Anesthesia Post Note  Patient: Billy Fuller  Procedure(s) Performed: CYSTOSCOPY WITH FULGERATION OF BLEEDERS (N/A )     Patient location during evaluation: PACU Anesthesia Type: General Level of consciousness: awake and alert Pain management: pain level controlled Vital Signs Assessment: post-procedure vital signs reviewed and stable Respiratory status: spontaneous breathing, nonlabored ventilation, respiratory function stable and patient connected to nasal cannula oxygen Cardiovascular status: blood pressure returned to baseline and stable Postop Assessment: no apparent nausea or vomiting Anesthetic complications: no    Last Vitals:  Vitals:   12/13/17 1530 12/13/17 1545  BP: (!) 174/78 (!) 175/84  Pulse: 90 89  Resp: 16 19  Temp:    SpO2: 98% 100%    Last Pain:  Vitals:   12/13/17 1545  TempSrc:   PainSc: 10-Worst pain ever                 Tiajuana Amass

## 2017-12-13 NOTE — Interval H&P Note (Signed)
History and Physical Interval Note:  He has continued to have hematuria overnight.     12/13/2017 1:35 PM  Billy Fuller  has presented today for surgery, with the diagnosis of RADIATION CYSTITIS  The various methods of treatment have been discussed with the patient and family. After consideration of risks, benefits and other options for treatment, the patient has consented to  Procedure(s): CYSTOSCOPY WITH FULGERATION OF BLEEDERS (N/A) as a surgical intervention .  The patient's history has been reviewed, patient examined, no change in status, stable for surgery.  I have reviewed the patient's chart and labs.  Questions were answered to the patient's satisfaction.     Irine Seal

## 2017-12-13 NOTE — Progress Notes (Signed)
Patient is able to void, urine is yellow and cloudy. Condom catheter placed and bladder scan reveals 47mL. Will continue to monitor patient.

## 2017-12-13 NOTE — Progress Notes (Signed)
Patient voided 155ml of yellow cloudy urine.

## 2017-12-13 NOTE — Transfer of Care (Signed)
Immediate Anesthesia Transfer of Care Note  Patient: Billy Fuller  Procedure(s) Performed: CYSTOSCOPY WITH FULGERATION OF BLEEDERS (N/A )  Patient Location: PACU  Anesthesia Type:General  Level of Consciousness: awake, alert  and oriented  Airway & Oxygen Therapy: Patient Spontanous Breathing and Patient connected to face mask oxygen  Post-op Assessment: Report given to RN and Post -op Vital signs reviewed and stable  Post vital signs: Reviewed and stable  Last Vitals:  Vitals Value Taken Time  BP    Temp    Pulse    Resp 19 12/13/2017  2:59 PM  SpO2    Vitals shown include unvalidated device data.  Last Pain:  Vitals:   12/13/17 1334  TempSrc:   PainSc: 0-No pain      Patients Stated Pain Goal: 2 (17/91/50 5697)  Complications: No apparent anesthesia complications

## 2017-12-13 NOTE — Anesthesia Procedure Notes (Signed)
Procedure Name: LMA Insertion Date/Time: 12/13/2017 2:13 PM Performed by: Maxwell Caul, CRNA Pre-anesthesia Checklist: Patient identified, Emergency Drugs available, Suction available and Patient being monitored Patient Re-evaluated:Patient Re-evaluated prior to induction Oxygen Delivery Method: Circle system utilized Preoxygenation: Pre-oxygenation with 100% oxygen Induction Type: IV induction LMA: LMA inserted LMA Size: 5.0 Number of attempts: 1 Placement Confirmation: positive ETCO2 and breath sounds checked- equal and bilateral Tube secured with: Tape Dental Injury: Teeth and Oropharynx as per pre-operative assessment

## 2017-12-13 NOTE — Progress Notes (Signed)
Bladder scan 374mL. Pt unable to void.

## 2017-12-13 NOTE — Anesthesia Preprocedure Evaluation (Signed)
Anesthesia Evaluation  Patient identified by MRN, date of birth, ID band Patient awake    Reviewed: Allergy & Precautions, H&P , NPO status , Patient's Chart, lab work & pertinent test results, reviewed documented beta blocker date and time   Airway Mallampati: II  TM Distance: >3 FB Neck ROM: full    Dental no notable dental hx.    Pulmonary former smoker,    Pulmonary exam normal breath sounds clear to auscultation       Cardiovascular Normal cardiovascular exam Rhythm:regular Rate:Normal     Neuro/Psych TIACVA    GI/Hepatic   Endo/Other    Renal/GU Renal disease     Musculoskeletal   Abdominal   Peds  Hematology   Anesthesia Other Findings   Reproductive/Obstetrics                             Anesthesia Physical  Anesthesia Plan  ASA: III  Anesthesia Plan: General   Post-op Pain Management:    Induction: Intravenous  PONV Risk Score and Plan: 2 and Dexamethasone, Ondansetron and Treatment may vary due to age or medical condition  Airway Management Planned: LMA  Additional Equipment:   Intra-op Plan:   Post-operative Plan: Extubation in OR  Informed Consent: I have reviewed the patients History and Physical, chart, labs and discussed the procedure including the risks, benefits and alternatives for the proposed anesthesia with the patient or authorized representative who has indicated his/her understanding and acceptance.   Dental Advisory Given  Plan Discussed with: CRNA, Surgeon and Anesthesiologist  Anesthesia Plan Comments: ( )        Anesthesia Quick Evaluation

## 2017-12-13 NOTE — Progress Notes (Signed)
Bladder scan 210cc

## 2017-12-14 ENCOUNTER — Encounter (HOSPITAL_COMMUNITY): Payer: Self-pay | Admitting: Urology

## 2017-12-14 NOTE — Discharge Summary (Signed)
Physician Discharge Summary  Patient ID: Billy Fuller MRN: [id number] DOB/AGE: Aug 11, 1945 72 y.o.  Admit date: 12/10/2017 Discharge date: 12/14/2017  Admission Diagnoses:  Discharge Diagnoses:  Active Problems:   Gross hematuria   Discharged Condition: good  Hospital Course: Patient was admitted to the hospital with clot retention.  Clot evacuation was performed in the emergency department.  He was started on continuous bladder irrigation and admitted to the hospital.  He underwent a cystoscopy with bladder biopsy and fulguration.  He tolerated this well and was voiding clear the following day.  He was stable for discharge.  Consults: None  Significant Diagnostic Studies: None  Treatments: surgery: As above  Discharge Exam: Blood pressure 127/62, pulse 92, temperature 98.3 F (36.8 C), temperature source Oral, resp. rate 20, height 5\' 6"  (1.676 m), weight 74.8 kg (165 lb), SpO2 94 %. General appearance: alert no acute distress Adequate peripheral perfusion of extremities Nonlabored respirations Abdomen soft nontender nondistended Condom catheter draining clear yellow urine  Disposition: Discharge disposition: 01-Home or Self Care       Discharge Instructions    Call MD for:  difficulty breathing, headache or visual disturbances   Complete by:  As directed    Call MD for:  persistant nausea and vomiting   Complete by:  As directed    Call MD for:  redness, tenderness, or signs of infection (pain, swelling, redness, odor or green/yellow discharge around incision site)   Complete by:  As directed    Call MD for:  severe uncontrolled pain   Complete by:  As directed    Call MD for:  temperature >100.4   Complete by:  As directed    Diet general   Complete by:  As directed    Discharge instructions   Complete by:  As directed    Activity:  You are encouraged to ambulate frequently (about every hour during waking hours) to help prevent blood clots from forming in your  legs or lungs.  However, you should not engage in any heavy lifting (> 10-15 lbs), strenuous activity, or straining. Diet: You should advance your diet as instructed by your physician.  It will be normal to have some bloating, nausea, and abdominal discomfort intermittently. Prescriptions:  You will be provided a prescription for pain medication to take as needed.  If your pain is not severe enough to require the prescription pain medication, you may take extra strength Tylenol instead which will have less side effects.  You should also take a prescribed stool softener to avoid straining with bowel movements as the prescription pain medication may constipate you. Incisions: You may remove your dressing bandages 48 hours after surgery if not removed in the hospital.  You will either have some small staples or special tissue glue at each of the incision sites. Once the bandages are removed (if present), the incisions may stay open to air.  You may start showering (but not soaking or bathing in water) the 2nd day after surgery and the incisions simply need to be patted dry after the shower.  No additional care is needed. What to call us about: You should call the office [phone]) if you develop fever > 101 or develop persistent vomiting.   Driving Restrictions   Complete by:  As directed    No driving while on narcotics   Increase activity slowly   Complete by:  As directed    Lifting restrictions   Complete by:  As directed    Nothing  greater than 10lbs     Allergies as of 12/14/2017   No Known Allergies     Medication List    STOP taking these medications   aspirin 325 MG tablet     TAKE these medications   atorvastatin 40 MG tablet Commonly known as:  LIPITOR Take 1 tablet (40 mg total) by mouth daily at 6 PM.   cephALEXin 500 MG capsule Commonly known as:  KEFLEX Take 1 capsule (500 mg total) by mouth 3 (three) times daily.   oxyCODONE-acetaminophen 5-325 MG tablet Commonly  known as:  PERCOCET/ROXICET Take 1 tablet by mouth every 6 (six) hours as needed for moderate pain.        Signed: Marton Redwood, III 12/14/2017, 8:32 AM

## 2017-12-14 NOTE — Discharge Instructions (Signed)
Dr [name] will arrange for outpatient follow-up with hyperbaric oxygen.

## 2018-01-02 ENCOUNTER — Telehealth: Payer: Self-pay

## 2018-01-02 NOTE — Telephone Encounter (Signed)
Veriified patient upcoming appointment. Will also mail a letter with a calender enclosed. Per 7/3 phone que

## 2018-01-07 ENCOUNTER — Telehealth: Payer: Self-pay | Admitting: Gastroenterology

## 2018-01-07 DIAGNOSIS — C775 Secondary and unspecified malignant neoplasm of intrapelvic lymph nodes: Secondary | ICD-10-CM | POA: Diagnosis not present

## 2018-01-07 DIAGNOSIS — C61 Malignant neoplasm of prostate: Secondary | ICD-10-CM | POA: Diagnosis not present

## 2018-01-07 DIAGNOSIS — N3041 Irradiation cystitis with hematuria: Secondary | ICD-10-CM | POA: Diagnosis not present

## 2018-01-07 NOTE — Telephone Encounter (Signed)
Returned call to Dr. [name]. The patient is not a [facility] patient. Dr. [name] was interested in suggestions to help stimulate the patients appetite. Dr. [name] already had decided to prescribe Marinol and the patient had left his office. I recommended further advice from the patients oncologist if the problem persists.

## 2018-01-08 ENCOUNTER — Encounter (HOSPITAL_BASED_OUTPATIENT_CLINIC_OR_DEPARTMENT_OTHER): Payer: PPO | Attending: Internal Medicine

## 2018-01-08 ENCOUNTER — Telehealth: Payer: Self-pay

## 2018-01-08 DIAGNOSIS — Z8546 Personal history of malignant neoplasm of prostate: Secondary | ICD-10-CM | POA: Diagnosis not present

## 2018-01-08 DIAGNOSIS — Z923 Personal history of irradiation: Secondary | ICD-10-CM | POA: Diagnosis not present

## 2018-01-08 DIAGNOSIS — R338 Other retention of urine: Secondary | ICD-10-CM | POA: Insufficient documentation

## 2018-01-08 DIAGNOSIS — Z8673 Personal history of transient ischemic attack (TIA), and cerebral infarction without residual deficits: Secondary | ICD-10-CM | POA: Insufficient documentation

## 2018-01-08 DIAGNOSIS — R350 Frequency of micturition: Secondary | ICD-10-CM | POA: Diagnosis not present

## 2018-01-08 DIAGNOSIS — Z87891 Personal history of nicotine dependence: Secondary | ICD-10-CM | POA: Diagnosis not present

## 2018-01-08 DIAGNOSIS — Y842 Radiological procedure and radiotherapy as the cause of abnormal reaction of the patient, or of later complication, without mention of misadventure at the time of the procedure: Secondary | ICD-10-CM | POA: Insufficient documentation

## 2018-01-08 DIAGNOSIS — Z9079 Acquired absence of other genital organ(s): Secondary | ICD-10-CM | POA: Diagnosis not present

## 2018-01-08 DIAGNOSIS — N3041 Irradiation cystitis with hematuria: Secondary | ICD-10-CM | POA: Diagnosis not present

## 2018-01-08 DIAGNOSIS — N3040 Irradiation cystitis without hematuria: Secondary | ICD-10-CM | POA: Insufficient documentation

## 2018-01-08 NOTE — Telephone Encounter (Signed)
Spoke with pt SO, Billy Fuller, in response to her VM. Pt has not been eating much for the last 3 weeks and has lost 25 pounds. Pt was seen by PCP today and medication ordered to enhance appetite. Pt began to eat more heartily today, saying to SO, "I am going to have to force myself to eat because if I don't, I know that I am going to die." Dr. Alen Blew made aware of pt situation. Encouraged pt SO to keep in touch if there are any further needs or changes. No visit with Dr. Alen Blew at this time. Pt SO verbalized understanding.

## 2018-01-09 ENCOUNTER — Telehealth: Payer: Self-pay

## 2018-01-09 ENCOUNTER — Telehealth: Payer: Self-pay | Admitting: Nutrition

## 2018-01-09 DIAGNOSIS — N3041 Irradiation cystitis with hematuria: Secondary | ICD-10-CM | POA: Diagnosis not present

## 2018-01-09 DIAGNOSIS — N3040 Irradiation cystitis without hematuria: Secondary | ICD-10-CM | POA: Diagnosis not present

## 2018-01-09 NOTE — Telephone Encounter (Signed)
I contacted patient at request of Dr. [name] secondary to patient's anorexia and weight loss.  Patient reports he just got out of the hyperbaric oxygen chamber and his head is in a "daze".  He would like for me to call him back next week and schedule an appointment to follow-up on nutrition.

## 2018-01-09 NOTE — Telephone Encounter (Signed)
Spoke with pt SO, Terri, to let her know that changes were relayed to Dr. Alen Blew, and he agreed no f/u at this time, but to call with any changes. Confirmed next appt for 05/23/18 at 1030 with Dr. Alen Blew. Pt SO verbalized understanding and thanks for the call.  Additionally, pt to begin hyperbaric treatment soon. Told by therapists to expect results within 2-3 weeks. Pt and SO optimistic.

## 2018-01-10 DIAGNOSIS — N3041 Irradiation cystitis with hematuria: Secondary | ICD-10-CM | POA: Diagnosis not present

## 2018-01-10 DIAGNOSIS — N3040 Irradiation cystitis without hematuria: Secondary | ICD-10-CM | POA: Diagnosis not present

## 2018-01-11 DIAGNOSIS — N3040 Irradiation cystitis without hematuria: Secondary | ICD-10-CM | POA: Diagnosis not present

## 2018-01-11 DIAGNOSIS — N3041 Irradiation cystitis with hematuria: Secondary | ICD-10-CM | POA: Diagnosis not present

## 2018-01-14 DIAGNOSIS — N3041 Irradiation cystitis with hematuria: Secondary | ICD-10-CM | POA: Diagnosis not present

## 2018-01-14 DIAGNOSIS — N3040 Irradiation cystitis without hematuria: Secondary | ICD-10-CM | POA: Diagnosis not present

## 2018-01-15 DIAGNOSIS — N3040 Irradiation cystitis without hematuria: Secondary | ICD-10-CM | POA: Diagnosis not present

## 2018-01-15 DIAGNOSIS — N3041 Irradiation cystitis with hematuria: Secondary | ICD-10-CM | POA: Diagnosis not present

## 2018-01-16 DIAGNOSIS — N3040 Irradiation cystitis without hematuria: Secondary | ICD-10-CM | POA: Diagnosis not present

## 2018-01-16 DIAGNOSIS — N3041 Irradiation cystitis with hematuria: Secondary | ICD-10-CM | POA: Diagnosis not present

## 2018-01-17 DIAGNOSIS — N3041 Irradiation cystitis with hematuria: Secondary | ICD-10-CM | POA: Diagnosis not present

## 2018-01-17 DIAGNOSIS — N3040 Irradiation cystitis without hematuria: Secondary | ICD-10-CM | POA: Diagnosis not present

## 2018-01-18 DIAGNOSIS — N3041 Irradiation cystitis with hematuria: Secondary | ICD-10-CM | POA: Diagnosis not present

## 2018-01-18 DIAGNOSIS — N3040 Irradiation cystitis without hematuria: Secondary | ICD-10-CM | POA: Diagnosis not present

## 2018-01-21 DIAGNOSIS — N3041 Irradiation cystitis with hematuria: Secondary | ICD-10-CM | POA: Diagnosis not present

## 2018-01-21 DIAGNOSIS — N3040 Irradiation cystitis without hematuria: Secondary | ICD-10-CM | POA: Diagnosis not present

## 2018-01-22 ENCOUNTER — Telehealth: Payer: Self-pay | Admitting: Nutrition

## 2018-01-22 DIAGNOSIS — Z5111 Encounter for antineoplastic chemotherapy: Secondary | ICD-10-CM | POA: Diagnosis not present

## 2018-01-22 DIAGNOSIS — N3041 Irradiation cystitis with hematuria: Secondary | ICD-10-CM | POA: Diagnosis not present

## 2018-01-22 DIAGNOSIS — N3040 Irradiation cystitis without hematuria: Secondary | ICD-10-CM | POA: Diagnosis not present

## 2018-01-22 DIAGNOSIS — C61 Malignant neoplasm of prostate: Secondary | ICD-10-CM | POA: Diagnosis not present

## 2018-01-22 NOTE — Telephone Encounter (Signed)
I attempted to contact patient by phone at Dr. Ralene Muskrat  request.  Patient did not answer telephone and there was no way to leave a message as his voicemail box was not set up.  I will try one more time to reach patient to discuss nutrition.

## 2018-01-24 DIAGNOSIS — N3040 Irradiation cystitis without hematuria: Secondary | ICD-10-CM | POA: Diagnosis not present

## 2018-01-24 DIAGNOSIS — N3041 Irradiation cystitis with hematuria: Secondary | ICD-10-CM | POA: Diagnosis not present

## 2018-01-25 DIAGNOSIS — N3041 Irradiation cystitis with hematuria: Secondary | ICD-10-CM | POA: Diagnosis not present

## 2018-01-25 DIAGNOSIS — N3040 Irradiation cystitis without hematuria: Secondary | ICD-10-CM | POA: Diagnosis not present

## 2018-01-29 DIAGNOSIS — N3040 Irradiation cystitis without hematuria: Secondary | ICD-10-CM | POA: Diagnosis not present

## 2018-01-29 DIAGNOSIS — N3041 Irradiation cystitis with hematuria: Secondary | ICD-10-CM | POA: Diagnosis not present

## 2018-01-30 DIAGNOSIS — N3040 Irradiation cystitis without hematuria: Secondary | ICD-10-CM | POA: Diagnosis not present

## 2018-01-30 DIAGNOSIS — N3041 Irradiation cystitis with hematuria: Secondary | ICD-10-CM | POA: Diagnosis not present

## 2018-01-31 ENCOUNTER — Encounter (HOSPITAL_BASED_OUTPATIENT_CLINIC_OR_DEPARTMENT_OTHER): Payer: PPO | Attending: Internal Medicine

## 2018-01-31 DIAGNOSIS — Z9079 Acquired absence of other genital organ(s): Secondary | ICD-10-CM | POA: Insufficient documentation

## 2018-01-31 DIAGNOSIS — Z8546 Personal history of malignant neoplasm of prostate: Secondary | ICD-10-CM | POA: Insufficient documentation

## 2018-01-31 DIAGNOSIS — N3041 Irradiation cystitis with hematuria: Secondary | ICD-10-CM | POA: Diagnosis not present

## 2018-01-31 DIAGNOSIS — Y842 Radiological procedure and radiotherapy as the cause of abnormal reaction of the patient, or of later complication, without mention of misadventure at the time of the procedure: Secondary | ICD-10-CM | POA: Diagnosis not present

## 2018-02-05 DIAGNOSIS — N3041 Irradiation cystitis with hematuria: Secondary | ICD-10-CM | POA: Diagnosis not present

## 2018-02-06 DIAGNOSIS — N3041 Irradiation cystitis with hematuria: Secondary | ICD-10-CM | POA: Diagnosis not present

## 2018-02-07 DIAGNOSIS — N3041 Irradiation cystitis with hematuria: Secondary | ICD-10-CM | POA: Diagnosis not present

## 2018-02-08 DIAGNOSIS — N3041 Irradiation cystitis with hematuria: Secondary | ICD-10-CM | POA: Diagnosis not present

## 2018-02-11 DIAGNOSIS — C61 Malignant neoplasm of prostate: Secondary | ICD-10-CM | POA: Diagnosis not present

## 2018-02-11 DIAGNOSIS — C775 Secondary and unspecified malignant neoplasm of intrapelvic lymph nodes: Secondary | ICD-10-CM | POA: Diagnosis not present

## 2018-02-11 DIAGNOSIS — N3041 Irradiation cystitis with hematuria: Secondary | ICD-10-CM | POA: Diagnosis not present

## 2018-02-11 DIAGNOSIS — N3040 Irradiation cystitis without hematuria: Secondary | ICD-10-CM | POA: Diagnosis not present

## 2018-02-12 DIAGNOSIS — N3041 Irradiation cystitis with hematuria: Secondary | ICD-10-CM | POA: Diagnosis not present

## 2018-02-13 DIAGNOSIS — N3041 Irradiation cystitis with hematuria: Secondary | ICD-10-CM | POA: Diagnosis not present

## 2018-02-14 DIAGNOSIS — N3041 Irradiation cystitis with hematuria: Secondary | ICD-10-CM | POA: Diagnosis not present

## 2018-02-18 ENCOUNTER — Other Ambulatory Visit: Payer: Self-pay | Admitting: Urology

## 2018-02-18 DIAGNOSIS — N3041 Irradiation cystitis with hematuria: Secondary | ICD-10-CM | POA: Diagnosis not present

## 2018-02-19 DIAGNOSIS — Z5111 Encounter for antineoplastic chemotherapy: Secondary | ICD-10-CM | POA: Diagnosis not present

## 2018-02-19 DIAGNOSIS — C61 Malignant neoplasm of prostate: Secondary | ICD-10-CM | POA: Diagnosis not present

## 2018-02-21 DIAGNOSIS — N3041 Irradiation cystitis with hematuria: Secondary | ICD-10-CM | POA: Diagnosis not present

## 2018-02-22 DIAGNOSIS — N3041 Irradiation cystitis with hematuria: Secondary | ICD-10-CM | POA: Diagnosis not present

## 2018-02-25 DIAGNOSIS — N3041 Irradiation cystitis with hematuria: Secondary | ICD-10-CM | POA: Diagnosis not present

## 2018-02-26 DIAGNOSIS — N3041 Irradiation cystitis with hematuria: Secondary | ICD-10-CM | POA: Diagnosis not present

## 2018-02-27 DIAGNOSIS — N3041 Irradiation cystitis with hematuria: Secondary | ICD-10-CM | POA: Diagnosis not present

## 2018-03-05 ENCOUNTER — Other Ambulatory Visit: Payer: Self-pay

## 2018-03-05 ENCOUNTER — Encounter (HOSPITAL_COMMUNITY): Payer: Self-pay | Admitting: *Deleted

## 2018-03-05 ENCOUNTER — Inpatient Hospital Stay (HOSPITAL_COMMUNITY)
Admission: AD | Admit: 2018-03-05 | Discharge: 2018-03-13 | DRG: 653 | Disposition: A | Discharge status: Home Health | Payer: PPO | Attending: Urology | Admitting: Urology

## 2018-03-05 ENCOUNTER — Encounter (HOSPITAL_BASED_OUTPATIENT_CLINIC_OR_DEPARTMENT_OTHER): Payer: PPO | Attending: Internal Medicine

## 2018-03-05 DIAGNOSIS — Y842 Radiological procedure and radiotherapy as the cause of abnormal reaction of the patient, or of later complication, without mention of misadventure at the time of the procedure: Secondary | ICD-10-CM | POA: Diagnosis present

## 2018-03-05 DIAGNOSIS — Z8673 Personal history of transient ischemic attack (TIA), and cerebral infarction without residual deficits: Secondary | ICD-10-CM | POA: Diagnosis not present

## 2018-03-05 DIAGNOSIS — N3941 Urge incontinence: Secondary | ICD-10-CM | POA: Diagnosis not present

## 2018-03-05 DIAGNOSIS — N3041 Irradiation cystitis with hematuria: Principal | ICD-10-CM | POA: Diagnosis present

## 2018-03-05 DIAGNOSIS — E43 Unspecified severe protein-calorie malnutrition: Secondary | ICD-10-CM | POA: Diagnosis present

## 2018-03-05 DIAGNOSIS — N3040 Irradiation cystitis without hematuria: Secondary | ICD-10-CM | POA: Diagnosis present

## 2018-03-05 DIAGNOSIS — C775 Secondary and unspecified malignant neoplasm of intrapelvic lymph nodes: Secondary | ICD-10-CM | POA: Diagnosis present

## 2018-03-05 DIAGNOSIS — C61 Malignant neoplasm of prostate: Secondary | ICD-10-CM | POA: Diagnosis not present

## 2018-03-05 DIAGNOSIS — R55 Syncope and collapse: Secondary | ICD-10-CM | POA: Diagnosis not present

## 2018-03-05 DIAGNOSIS — Z841 Family history of disorders of kidney and ureter: Secondary | ICD-10-CM | POA: Diagnosis not present

## 2018-03-05 DIAGNOSIS — Z6823 Body mass index (BMI) 23.0-23.9, adult: Secondary | ICD-10-CM

## 2018-03-05 DIAGNOSIS — Z87891 Personal history of nicotine dependence: Secondary | ICD-10-CM

## 2018-03-05 DIAGNOSIS — Z8042 Family history of malignant neoplasm of prostate: Secondary | ICD-10-CM

## 2018-03-05 DIAGNOSIS — Z936 Other artificial openings of urinary tract status: Secondary | ICD-10-CM

## 2018-03-05 DIAGNOSIS — N3289 Other specified disorders of bladder: Secondary | ICD-10-CM | POA: Diagnosis not present

## 2018-03-05 LAB — CBC
HCT: 34.7 % — ABNORMAL LOW (ref 39.0–52.0)
HEMOGLOBIN: 11.4 g/dL — AB (ref 13.0–17.0)
MCH: 27.6 pg (ref 26.0–34.0)
MCHC: 32.9 g/dL (ref 30.0–36.0)
MCV: 84 fL (ref 78.0–100.0)
Platelets: 266 10*3/uL (ref 150–400)
RBC: 4.13 MIL/uL — ABNORMAL LOW (ref 4.22–5.81)
RDW: 14.9 % (ref 11.5–15.5)
WBC: 5.8 10*3/uL (ref 4.0–10.5)

## 2018-03-05 LAB — PREPARE RBC (CROSSMATCH)

## 2018-03-05 LAB — SURGICAL PCR SCREEN
MRSA, PCR: NEGATIVE
STAPHYLOCOCCUS AUREUS: NEGATIVE

## 2018-03-05 MED ORDER — METRONIDAZOLE 500 MG PO TABS
500.0000 mg | ORAL_TABLET | Freq: Four times a day (QID) | ORAL | Status: AC
  Administered 2018-03-05 (×3): 500 mg via ORAL
  Filled 2018-03-05 (×3): qty 1

## 2018-03-05 MED ORDER — PROMETHAZINE HCL 25 MG/ML IJ SOLN
12.5000 mg | Freq: Four times a day (QID) | INTRAMUSCULAR | Status: DC | PRN
  Administered 2018-03-05: 12.5 mg via INTRAVENOUS
  Filled 2018-03-05: qty 1

## 2018-03-05 MED ORDER — PIPERACILLIN-TAZOBACTAM 3.375 G IVPB 30 MIN
3.3750 g | INTRAVENOUS | Status: DC
  Filled 2018-03-05: qty 50

## 2018-03-05 MED ORDER — PEG 3350-KCL-NA BICARB-NACL 420 G PO SOLR
4000.0000 mL | Freq: Once | ORAL | Status: AC
  Administered 2018-03-05: 4000 mL via ORAL

## 2018-03-05 MED ORDER — PIPERACILLIN-TAZOBACTAM 3.375 G IVPB 30 MIN
3.3750 g | INTRAVENOUS | Status: AC
  Administered 2018-03-06: 3.375 g via INTRAVENOUS
  Filled 2018-03-05 (×2): qty 50

## 2018-03-05 MED ORDER — SODIUM CHLORIDE 0.9 % IV SOLN
INTRAVENOUS | Status: DC
  Administered 2018-03-05 – 2018-03-13 (×2): via INTRAVENOUS

## 2018-03-05 MED ORDER — ALVIMOPAN 12 MG PO CAPS
12.0000 mg | ORAL_CAPSULE | ORAL | Status: AC
  Administered 2018-03-06: 12 mg via ORAL
  Filled 2018-03-05: qty 1

## 2018-03-05 MED ORDER — BOOST / RESOURCE BREEZE PO LIQD CUSTOM
1.0000 | Freq: Three times a day (TID) | ORAL | Status: DC
Start: 2018-03-05 — End: 2018-03-07
  Administered 2018-03-05: 1 via ORAL

## 2018-03-05 MED ORDER — NEOMYCIN SULFATE 500 MG PO TABS
1000.0000 mg | ORAL_TABLET | Freq: Four times a day (QID) | ORAL | Status: AC
  Administered 2018-03-05 (×3): 1000 mg via ORAL
  Filled 2018-03-05 (×3): qty 2

## 2018-03-05 MED ORDER — PIPERACILLIN-TAZOBACTAM 3.375 G IVPB 30 MIN: 3.3750 g | INTRAVENOUS | Status: DC

## 2018-03-05 NOTE — H&P (Signed)
Billy Fuller is an 72 y.o. male.    Chief Complaint: Pre-op Cystoectomy with Urinary Diversion  HPI:   1 - Metastatic Prostate Cancer - s/p prostatectomy 2011 for Stage 3 disease, then salvage radiation and Abiraterone trial at [facility] until 2015. PSA rise to 30 2018 and resumed androgen deprivation. Has had opinion from [place].   Recent Course:  07/2017 - PET/CT - non-bulbkly Rt ext. iliac and peri-aortic nodes.  10/2017 - PSA 1.55 / T 18   2- End Stage Radiation Cystitis with Hematuria / Clot Retention / Severe Dysuria - pt with severe progressive hematuria with clot retention, severe dysuria with urge incontinence. S/p severeal operative fulgerations and even >20 hyperbaric O2 treatments without symptom releif. He is miserable.   PMH sig for mild CVA (no deficits), no ischemic CV disease / blood thinners. No prior non-GU surgery. His PCP is [name] MD.   Today "Jet" is seen as pre-op admission / stomal marking / bowel prep prior to cystectomy / urinary diversion tomorrow.    Past Medical History:  Diagnosis Date  . Cataract    eye implants  . ED (erectile dysfunction)   . Nephrolithiasis   . Prostate CA ([place]) 08/06/09   recurrent  . Stroke [facility])     Past Surgical History:  Procedure Laterality Date  . CYSTOSCOPY WITH FULGERATION N/A 09/04/2017   Procedure: [place] AND CLOT EVACUATION;  Surgeon: Alexis Frock, MD;  Location: [facility] ORS;  Service: Urology;  Laterality: N/A;  . CYSTOSCOPY WITH FULGERATION N/A 12/13/2017   Procedure: CYSTOSCOPY WITH FULGERATION OF BLEEDERS;  Surgeon: Irine Seal, MD;  Location: [facility]S;  Service: Urology;  Laterality: N/A;  . ROBOT ASSISTED LAPAROSCOPIC RADICAL PROSTATECTOMY     10/06/09    Family History  Problem Relation Age of Onset  . Cancer Mother        breast  . Kidney failure Father   . Prostate cancer Father    Social History:  reports that he quit smoking about 38 years ago. He has never used smokeless tobacco.  He reports that he drinks alcohol. He reports that he does not use drugs.  Allergies: No Known Allergies  Medications Prior to Admission  Medication Sig Dispense Refill  . atorvastatin (LIPITOR) 40 MG tablet Take 1 tablet (40 mg total) by mouth daily at 6 PM. 30 tablet 0  . oxyCODONE-acetaminophen (PERCOCET/ROXICET) 5-325 MG tablet Take 1 tablet by mouth every 6 (six) hours as needed for moderate pain. 30 tablet 0  . cephALEXin (KEFLEX) 500 MG capsule Take 1 capsule (500 mg total) by mouth 3 (three) times daily. (Patient not taking: Reported on 02/26/2018) 21 capsule 0  . Multiple Vitamin (MULTIVITAMIN WITH MINERALS) TABS tablet Take 1 tablet by mouth daily.      Results for orders placed or performed during the hospital encounter of 03/05/18 (from the past 48 hour(s))  Type and screen [place]     Status: None (Preliminary result)   Collection Time: 03/05/18 10:44 AM  Result Value Ref Range   ABO/RH(D) A POS    Antibody Screen NEG    Sample Expiration 03/08/2018    Unit Number [id number]    Blood Component Type RED CELLS,LR    Unit division 00    Status of Unit ALLOCATED    Transfusion Status OK TO TRANSFUSE    Crossmatch Result      Compatible Performed at [facility], [place] [address] [place], [place] [address]  Unit Number [id number]    Blood Component Type RED CELLS,LR    Unit division 00    Status of Unit ALLOCATED    Transfusion Status OK TO TRANSFUSE    Crossmatch Result Compatible   CBC     Status: Abnormal   Collection Time: 03/05/18 10:53 AM  Result Value Ref Range   WBC 5.8 4.0 - 10.5 K/uL   RBC 4.13 (L) 4.22 - 5.81 MIL/uL   Hemoglobin 11.4 (L) 13.0 - 17.0 g/dL   HCT 34.7 (L) 39.0 - 52.0 %   MCV 84.0 78.0 - 100.0 fL   MCH 27.6 26.0 - 34.0 pg   MCHC 32.9 30.0 - 36.0 g/dL   RDW 14.9 11.5 - 15.5 %   Platelets 266 150 - 400 K/uL    Comment: Performed at [facility], [place] [address]  [place], [place] [address]  Prepare RBC (crossmatch)     Status: None   Collection Time: 03/05/18 10:53 AM  Result Value Ref Range   Order Confirmation      ORDER PROCESSED BY BLOOD BANK Performed at [facility], [place] [address] [place], [place] [address]   Surgical pcr screen     Status: None   Collection Time: 03/05/18 12:12 PM  Result Value Ref Range   MRSA, PCR NEGATIVE NEGATIVE   Staphylococcus aureus NEGATIVE NEGATIVE    Comment: (NOTE) The Xpert SA Assay (FDA approved for NASAL specimens in patients 65 years of age and older), is one component of a comprehensive surveillance program. It is not intended to diagnose infection nor to guide or monitor treatment. Performed at [facility], [place] [address], [place], [place] [address]    No results found.  ROS  Blood pressure 124/78, pulse 81, temperature 98.6 F (37 C), temperature source Oral, resp. rate 18, height 5\' 5"  (1.651 m), weight 62.7 kg, SpO2 98 %. Physical Exam   Assessment/Plan  Stomal marking, bowel prep, entereg in preparateion for cystectomy and conduit diverison for goal of control of end stage radiation cystitis. Risks, benefits, alternatives, expected peri-op course discussed previously and reiterated today.   Alexis Frock, MD 03/05/2018, 2:25 PM

## 2018-03-05 NOTE — Progress Notes (Signed)
Pt no longer drinking bowel prep. Education provided. Pt stated that he was told to "do the best I could and I did, Im done". Further education provided and stated that he will not drink anymore. Billy Fuller

## 2018-03-05 NOTE — Consult Note (Signed)
[place] Nurse requested for preoperative stoma site marking  Discussed surgical procedure and stoma creation with patient and family.  Explained role of the [facility] nurse team.  Provided the patient with educational booklet and provided samples of pouching options.  Answered patient and family questions.   Examined patient lying, sitting, and standing in order to place the marking in the patient's visual field, away from any creases or abdominal contour issues and within the rectus muscl  Marked for ileal conduit in the RLQ _3___  cm to the right of the umbilicus and  _3___ cm below the umbilicus.   Patient's abdomen cleansed with CHG wipes at site markings, allowed to air dry prior to marking.Covered mark with thin film transparent dressing to preserve mark until date of surgery.   [place] Nurse team will follow up with patient after surgery for continue ostomy care and teaching.  [place] MSN, [place], [place], [place]

## 2018-03-06 ENCOUNTER — Inpatient Hospital Stay (HOSPITAL_COMMUNITY): Payer: PPO | Admitting: Anesthesiology

## 2018-03-06 ENCOUNTER — Encounter (HOSPITAL_COMMUNITY): Payer: Self-pay | Admitting: Anesthesiology

## 2018-03-06 ENCOUNTER — Encounter (HOSPITAL_COMMUNITY)
Admission: AD | Disposition: A | Discharge status: Home Health | Payer: Self-pay | Source: Home / Self Care | Attending: Urology

## 2018-03-06 DIAGNOSIS — N3040 Irradiation cystitis without hematuria: Secondary | ICD-10-CM | POA: Diagnosis present

## 2018-03-06 DIAGNOSIS — Z936 Other artificial openings of urinary tract status: Secondary | ICD-10-CM

## 2018-03-06 HISTORY — PX: LYMPHADENECTOMY: SHX5960

## 2018-03-06 HISTORY — PX: ROBOT ASSISTED LAPAROSCOPIC COMPLETE CYSTECT ILEAL CONDUIT: SHX5139

## 2018-03-06 LAB — BASIC METABOLIC PANEL
Anion gap: 8 (ref 5–15)
BUN: 16 mg/dL (ref 8–23)
CALCIUM: 8.4 mg/dL — AB (ref 8.9–10.3)
CO2: 26 mmol/L (ref 22–32)
CREATININE: 1.02 mg/dL (ref 0.61–1.24)
Chloride: 106 mmol/L (ref 98–111)
GFR calc Af Amer: >60 mL/min (ref >60)
GFR calc non Af Amer: >60 mL/min (ref >60)
GLUCOSE: 195 mg/dL — AB (ref 70–99)
Potassium: 4.7 mmol/L (ref 3.5–5.1)
Sodium: 140 mmol/L (ref 135–145)

## 2018-03-06 LAB — HEMOGLOBIN AND HEMATOCRIT, BLOOD
HEMATOCRIT: 31.3 % — AB (ref 39.0–52.0)
HEMOGLOBIN: 10.3 g/dL — AB (ref 13.0–17.0)

## 2018-03-06 SURGERY — CYSTECTOMY, ROBOT-ASSISTED, WITH ILEAL CONDUIT CREATION
Anesthesia: General

## 2018-03-06 MED ORDER — HEPARIN SODIUM (PORCINE) 5000 UNIT/ML IJ SOLN
5000.0000 [IU] | Freq: Three times a day (TID) | INTRAMUSCULAR | Status: DC
  Administered 2018-03-07 – 2018-03-12 (×15): 5000 [IU] via SUBCUTANEOUS
  Filled 2018-03-06 (×15): qty 1

## 2018-03-06 MED ORDER — ROCURONIUM BROMIDE 10 MG/ML (PF) SYRINGE
PREFILLED_SYRINGE | INTRAVENOUS | Status: AC
  Filled 2018-03-06: qty 10

## 2018-03-06 MED ORDER — HYDROMORPHONE HCL 1 MG/ML IJ SOLN
INTRAMUSCULAR | Status: AC
  Administered 2018-03-06: 0.25 mg via INTRAVENOUS
  Filled 2018-03-06: qty 1

## 2018-03-06 MED ORDER — ONDANSETRON HCL 4 MG/2ML IJ SOLN
INTRAMUSCULAR | Status: DC | PRN
  Administered 2018-03-06: 4 mg via INTRAVENOUS

## 2018-03-06 MED ORDER — SUFENTANIL CITRATE 50 MCG/ML IV SOLN
INTRAVENOUS | Status: AC
  Filled 2018-03-06: qty 1

## 2018-03-06 MED ORDER — LACTATED RINGERS IV SOLN
INTRAVENOUS | Status: DC
  Administered 2018-03-06 (×2): via INTRAVENOUS

## 2018-03-06 MED ORDER — SODIUM CHLORIDE 0.9 % IJ SOLN
INTRAMUSCULAR | Status: AC
  Filled 2018-03-06: qty 10

## 2018-03-06 MED ORDER — HYDROMORPHONE HCL 1 MG/ML IJ SOLN
0.2500 mg | INTRAMUSCULAR | Status: DC | PRN
  Administered 2018-03-06 (×4): 0.25 mg via INTRAVENOUS

## 2018-03-06 MED ORDER — SUGAMMADEX SODIUM 200 MG/2ML IV SOLN
INTRAVENOUS | Status: AC
  Filled 2018-03-06: qty 2

## 2018-03-06 MED ORDER — ONDANSETRON HCL 4 MG/2ML IJ SOLN: 4.0000 mg | INTRAMUSCULAR | Status: DC | PRN

## 2018-03-06 MED ORDER — ROCURONIUM BROMIDE 10 MG/ML (PF) SYRINGE
PREFILLED_SYRINGE | INTRAVENOUS | Status: DC | PRN
  Administered 2018-03-06: 80 mg via INTRAVENOUS
  Administered 2018-03-06 (×2): 20 mg via INTRAVENOUS

## 2018-03-06 MED ORDER — HYDROMORPHONE HCL 2 MG/ML IJ SOLN
INTRAMUSCULAR | Status: AC
  Filled 2018-03-06: qty 1

## 2018-03-06 MED ORDER — ACETAMINOPHEN 10 MG/ML IV SOLN
INTRAVENOUS | Status: AC
  Administered 2018-03-06: 1000 mg via INTRAVENOUS
  Filled 2018-03-06: qty 100

## 2018-03-06 MED ORDER — MORPHINE SULFATE (PF) 2 MG/ML IV SOLN
2.0000 mg | INTRAVENOUS | Status: DC | PRN
  Administered 2018-03-07 (×2): 2 mg via INTRAVENOUS
  Administered 2018-03-07: 4 mg via INTRAVENOUS
  Administered 2018-03-07: 2 mg via INTRAVENOUS
  Administered 2018-03-08: 4 mg via INTRAVENOUS
  Filled 2018-03-06 (×2): qty 1
  Filled 2018-03-06: qty 2
  Filled 2018-03-06: qty 1
  Filled 2018-03-06: qty 2

## 2018-03-06 MED ORDER — ACETAMINOPHEN 10 MG/ML IV SOLN
1000.0000 mg | Freq: Once | INTRAVENOUS | Status: DC | PRN
  Administered 2018-03-06: 1000 mg via INTRAVENOUS

## 2018-03-06 MED ORDER — MIDAZOLAM HCL 2 MG/2ML IJ SOLN
INTRAMUSCULAR | Status: AC
  Filled 2018-03-06: qty 2

## 2018-03-06 MED ORDER — HYDROMORPHONE HCL 1 MG/ML IJ SOLN
INTRAMUSCULAR | Status: DC | PRN
  Administered 2018-03-06 (×3): .4 mg via INTRAVENOUS

## 2018-03-06 MED ORDER — LIDOCAINE HCL 2 % IJ SOLN
INTRAMUSCULAR | Status: AC
  Filled 2018-03-06: qty 20

## 2018-03-06 MED ORDER — WATER FOR IRRIGATION, STERILE IR SOLN
Status: DC | PRN
  Administered 2018-03-06: 1000 mL

## 2018-03-06 MED ORDER — PROPOFOL 10 MG/ML IV BOLUS
INTRAVENOUS | Status: AC
  Filled 2018-03-06: qty 20

## 2018-03-06 MED ORDER — LIDOCAINE 2% (20 MG/ML) 5 ML SYRINGE
INTRAMUSCULAR | Status: DC | PRN
  Administered 2018-03-06: 1 mg/kg/h via INTRAVENOUS

## 2018-03-06 MED ORDER — PHENYLEPHRINE 40 MCG/ML (10ML) SYRINGE FOR IV PUSH (FOR BLOOD PRESSURE SUPPORT)
PREFILLED_SYRINGE | INTRAVENOUS | Status: AC
  Filled 2018-03-06: qty 10

## 2018-03-06 MED ORDER — ONDANSETRON HCL 4 MG/2ML IJ SOLN
INTRAMUSCULAR | Status: AC
  Filled 2018-03-06: qty 2

## 2018-03-06 MED ORDER — ACETAMINOPHEN 10 MG/ML IV SOLN
1000.0000 mg | Freq: Four times a day (QID) | INTRAVENOUS | Status: AC
  Administered 2018-03-06 – 2018-03-07 (×4): 1000 mg via INTRAVENOUS
  Filled 2018-03-06 (×4): qty 100

## 2018-03-06 MED ORDER — BUPIVACAINE LIPOSOME 1.3 % IJ SUSP
20.0000 mL | Freq: Once | INTRAMUSCULAR | Status: AC
  Administered 2018-03-06: 20 mL
  Filled 2018-03-06: qty 20

## 2018-03-06 MED ORDER — LACTATED RINGERS IV SOLN
INTRAVENOUS | Status: DC
  Administered 2018-03-06 – 2018-03-07 (×3): via INTRAVENOUS

## 2018-03-06 MED ORDER — LIDOCAINE 2% (20 MG/ML) 5 ML SYRINGE
INTRAMUSCULAR | Status: DC | PRN
  Administered 2018-03-06: 100 mg via INTRAVENOUS

## 2018-03-06 MED ORDER — KETAMINE HCL 10 MG/ML IJ SOLN
INTRAMUSCULAR | Status: DC | PRN
  Administered 2018-03-06 (×2): 5 mg via INTRAVENOUS
  Administered 2018-03-06: 30 mg via INTRAVENOUS
  Administered 2018-03-06 (×4): 5 mg via INTRAVENOUS

## 2018-03-06 MED ORDER — PIPERACILLIN-TAZOBACTAM 3.375 G IVPB
3.3750 g | Freq: Three times a day (TID) | INTRAVENOUS | Status: AC
  Administered 2018-03-06 – 2018-03-08 (×6): 3.375 g via INTRAVENOUS
  Filled 2018-03-06 (×6): qty 50

## 2018-03-06 MED ORDER — ALVIMOPAN 12 MG PO CAPS
12.0000 mg | ORAL_CAPSULE | Freq: Two times a day (BID) | ORAL | Status: DC
  Administered 2018-03-07 – 2018-03-11 (×10): 12 mg via ORAL
  Filled 2018-03-06 (×11): qty 1

## 2018-03-06 MED ORDER — LACTATED RINGERS IR SOLN
Status: DC | PRN
  Administered 2018-03-06: 1000 mL

## 2018-03-06 MED ORDER — SUFENTANIL CITRATE 50 MCG/ML IV SOLN
INTRAVENOUS | Status: DC | PRN
  Administered 2018-03-06: 20 ug via INTRAVENOUS
  Administered 2018-03-06 (×8): 10 ug via INTRAVENOUS

## 2018-03-06 MED ORDER — PROPOFOL 10 MG/ML IV BOLUS
INTRAVENOUS | Status: DC | PRN
  Administered 2018-03-06: 80 mg via INTRAVENOUS

## 2018-03-06 MED ORDER — LIDOCAINE 2% (20 MG/ML) 5 ML SYRINGE
INTRAMUSCULAR | Status: AC
  Filled 2018-03-06: qty 5

## 2018-03-06 MED ORDER — SODIUM CHLORIDE 0.9 % IJ SOLN
INTRAMUSCULAR | Status: DC | PRN
  Administered 2018-03-06: 20 mL

## 2018-03-06 MED ORDER — KETAMINE HCL 10 MG/ML IJ SOLN
INTRAMUSCULAR | Status: AC
  Filled 2018-03-06: qty 1

## 2018-03-06 MED ORDER — ATORVASTATIN CALCIUM 40 MG PO TABS
40.0000 mg | ORAL_TABLET | Freq: Every day | ORAL | Status: DC
  Administered 2018-03-06 – 2018-03-12 (×5): 40 mg via ORAL
  Filled 2018-03-06 (×5): qty 1

## 2018-03-06 MED ORDER — DEXAMETHASONE SODIUM PHOSPHATE 10 MG/ML IJ SOLN
INTRAMUSCULAR | Status: DC | PRN
  Administered 2018-03-06: 10 mg via INTRAVENOUS

## 2018-03-06 MED ORDER — LIDOCAINE HCL 2 % IJ SOLN
INTRAMUSCULAR | Status: AC
  Filled 2018-03-06: qty 40

## 2018-03-06 MED ORDER — PROMETHAZINE HCL 25 MG/ML IJ SOLN: 6.2500 mg | INTRAMUSCULAR | Status: DC | PRN

## 2018-03-06 MED ORDER — MIDAZOLAM HCL 2 MG/2ML IJ SOLN
INTRAMUSCULAR | Status: DC | PRN
  Administered 2018-03-06: 2 mg via INTRAVENOUS

## 2018-03-06 MED ORDER — PHENYLEPHRINE 40 MCG/ML (10ML) SYRINGE FOR IV PUSH (FOR BLOOD PRESSURE SUPPORT)
PREFILLED_SYRINGE | INTRAVENOUS | Status: DC | PRN
  Administered 2018-03-06: 80 ug via INTRAVENOUS

## 2018-03-06 MED ORDER — DEXAMETHASONE SODIUM PHOSPHATE 10 MG/ML IJ SOLN
INTRAMUSCULAR | Status: AC
  Filled 2018-03-06: qty 1

## 2018-03-06 MED ORDER — SUGAMMADEX SODIUM 200 MG/2ML IV SOLN
INTRAVENOUS | Status: DC | PRN
  Administered 2018-03-06: 130 mg via INTRAVENOUS

## 2018-03-06 MED ORDER — SODIUM CHLORIDE 0.9 % IJ SOLN
INTRAMUSCULAR | Status: AC
  Filled 2018-03-06: qty 20

## 2018-03-06 MED ORDER — LACTATED RINGERS IV SOLN
INTRAVENOUS | Status: DC
  Administered 2018-03-06: 08:00:00 via INTRAVENOUS

## 2018-03-06 SURGICAL SUPPLY — 82 items
APPLICATOR COTTON TIP 6 STRL (MISCELLANEOUS) ×2 IMPLANT
APPLICATOR COTTON TIP 6IN STRL (MISCELLANEOUS) ×4
APPLICATOR SURGIFLO ENDO (HEMOSTASIS) IMPLANT
BAG LAPAROSCOPIC 12 15 PORT 16 (BASKET) ×2 IMPLANT
BAG RETRIEVAL 12/15 (BASKET) ×3
BAG RETRIEVAL 12/15MM (BASKET) ×1
BENZOIN TINCTURE PRP APPL 2/3 (GAUZE/BANDAGES/DRESSINGS) ×4 IMPLANT
BLADE SURG SZ10 CARB STEEL (BLADE) IMPLANT
CATH FOLEY 2WAY SLVR 18FR 30CC (CATHETERS) ×4 IMPLANT
CELLS DAT CNTRL 66122 CELL SVR (MISCELLANEOUS) ×2 IMPLANT
CHLORAPREP W/TINT 26ML (MISCELLANEOUS) ×4 IMPLANT
CLIP VESOLOCK LG 6/CT PURPLE (CLIP) ×8 IMPLANT
CLIP VESOLOCK MED LG 6/CT (CLIP) ×4 IMPLANT
CLIP VESOLOCK XL 6/CT (CLIP) ×8 IMPLANT
COVER SURGICAL LIGHT HANDLE (MISCELLANEOUS) ×4 IMPLANT
COVER TIP SHEARS 8 DVNC (MISCELLANEOUS) ×2 IMPLANT
COVER TIP SHEARS 8MM DA VINCI (MISCELLANEOUS) ×2
DECANTER SPIKE VIAL GLASS SM (MISCELLANEOUS) ×4 IMPLANT
DERMABOND ADVANCED (GAUZE/BANDAGES/DRESSINGS) ×2
DERMABOND ADVANCED .7 DNX12 (GAUZE/BANDAGES/DRESSINGS) ×2 IMPLANT
DRAIN PENROSE 18X1/2 LTX STRL (DRAIN) IMPLANT
DRAPE ARM DVNC X/XI (DISPOSABLE) ×8 IMPLANT
DRAPE COLUMN DVNC XI (DISPOSABLE) ×2 IMPLANT
DRAPE DA VINCI XI ARM (DISPOSABLE) ×8
DRAPE DA VINCI XI COLUMN (DISPOSABLE) ×2
DRAPE SHEET LG 3/4 BI-LAMINATE (DRAPES) ×4 IMPLANT
DRSG TEGADERM 4X4.75 (GAUZE/BANDAGES/DRESSINGS) ×4 IMPLANT
ELECT CAUTERY BLADE 6.4 (BLADE) IMPLANT
ELECT PENCIL ROCKER SW 15FT (MISCELLANEOUS) ×4 IMPLANT
ELECT REM PT RETURN 15FT ADLT (MISCELLANEOUS) ×4 IMPLANT
GLOVE BIO SURGEON STRL SZ 6.5 (GLOVE) ×6 IMPLANT
GLOVE BIO SURGEONS STRL SZ 6.5 (GLOVE) ×2
GLOVE BIOGEL M STRL SZ7.5 (GLOVE) ×8 IMPLANT
GOWN STRL REUS W/TWL LRG LVL3 (GOWN DISPOSABLE) ×20 IMPLANT
IRRIG SUCT STRYKERFLOW 2 WTIP (MISCELLANEOUS) ×4
IRRIGATION SUCT STRKRFLW 2 WTP (MISCELLANEOUS) ×2 IMPLANT
KIT PROCEDURE DA VINCI SI (MISCELLANEOUS)
KIT PROCEDURE DVNC SI (MISCELLANEOUS) IMPLANT
LOOP VESSEL MAXI BLUE (MISCELLANEOUS) ×4 IMPLANT
NEEDLE INSUFFLATION 14GA 120MM (NEEDLE) ×4 IMPLANT
PACK ROBOT UROLOGY CUSTOM (CUSTOM PROCEDURE TRAY) ×4 IMPLANT
PAD POSITIONING PINK XL (MISCELLANEOUS) ×4 IMPLANT
PORT ACCESS TROCAR AIRSEAL 12 (TROCAR) ×2 IMPLANT
PORT ACCESS TROCAR AIRSEAL 5M (TROCAR) ×2
RELOAD STAPLER GREEN 60MM (STAPLE) ×6 IMPLANT
RELOAD STAPLER WHITE 60MM (STAPLE) ×10 IMPLANT
RETRACTOR LONRSTAR 16.6X16.6CM (MISCELLANEOUS) IMPLANT
RETRACTOR STAY HOOK 5MM (MISCELLANEOUS) IMPLANT
RETRACTOR STER APS 16.6X16.6CM (MISCELLANEOUS)
RTRCTR WOUND ALEXIS 18CM MED (MISCELLANEOUS) ×4
SEAL CANN UNIV 5-8 DVNC XI (MISCELLANEOUS) ×8 IMPLANT
SEAL XI 5MM-8MM UNIVERSAL (MISCELLANEOUS) ×8
SET TRI-LUMEN FLTR TB AIRSEAL (TUBING) ×4 IMPLANT
SOLUTION ELECTROLUBE (MISCELLANEOUS) ×4 IMPLANT
SPONGE LAP 18X18 RF (DISPOSABLE) ×8 IMPLANT
SPONGE LAP 4X18 RFD (DISPOSABLE) ×4 IMPLANT
STAPLER ECHELON LONG 60 440 (INSTRUMENTS) ×4 IMPLANT
STAPLER RELOAD GREEN 60MM (STAPLE) ×12
STAPLER RELOAD WHITE 60MM (STAPLE) ×20
STENT SET URETHERAL LEFT 7FR (STENTS) ×4 IMPLANT
STENT SET URETHERAL RIGHT 7FR (STENTS) ×4 IMPLANT
SURGIFLO W/THROMBIN 8M KIT (HEMOSTASIS) IMPLANT
SUT CHROMIC 4 0 RB 1X27 (SUTURE) ×4 IMPLANT
SUT ETHILON 3 0 PS 1 (SUTURE) ×4 IMPLANT
SUT MNCRL AB 4-0 PS2 18 (SUTURE) ×8 IMPLANT
SUT PDS AB 0 CTX 36 PDP370T (SUTURE) ×12 IMPLANT
SUT SILK 3 0 SH 30 (SUTURE) IMPLANT
SUT SILK 3 0 SH CR/8 (SUTURE) ×4 IMPLANT
SUT VIC AB 2-0 SH 18 (SUTURE) IMPLANT
SUT VIC AB 2-0 UR5 27 (SUTURE) ×16 IMPLANT
SUT VIC AB 3-0 SH 27 (SUTURE) ×8
SUT VIC AB 3-0 SH 27X BRD (SUTURE) ×2 IMPLANT
SUT VIC AB 3-0 SH 27XBRD (SUTURE) ×6 IMPLANT
SUT VIC AB 4-0 RB1 27 (SUTURE) ×8
SUT VIC AB 4-0 RB1 27XBRD (SUTURE) ×8 IMPLANT
SUT VLOC BARB 180 ABS3/0GR12 (SUTURE) ×4
SUTURE VLOC BRB 180 ABS3/0GR12 (SUTURE) ×2 IMPLANT
SYRINGE 20CC LL (MISCELLANEOUS) ×4 IMPLANT
SYSTEM UROSTOMY GENTLE TOUCH (WOUND CARE) ×4 IMPLANT
TOWEL OR NON WOVEN STRL DISP B (DISPOSABLE) ×4 IMPLANT
TROCAR BLADELESS 15MM (ENDOMECHANICALS) ×4 IMPLANT
YANKAUER SUCT BULB TIP 10FT TU (MISCELLANEOUS) ×4 IMPLANT

## 2018-03-06 NOTE — Anesthesia Preprocedure Evaluation (Signed)
Anesthesia Evaluation  Patient identified by MRN, date of birth, ID band Patient awake    Reviewed: Allergy & Precautions, NPO status , Patient's Chart, lab work & pertinent test results  Airway Mallampati: II  TM Distance: >3 FB Neck ROM: Full    Dental no notable dental hx.    Pulmonary neg pulmonary ROS, former smoker,    Pulmonary exam normal breath sounds clear to auscultation       Cardiovascular negative cardio ROS Normal cardiovascular exam Rhythm:Regular Rate:Normal     Neuro/Psych TIACVA, Residual Symptoms negative psych ROS   GI/Hepatic negative GI ROS, Neg liver ROS,   Endo/Other  negative endocrine ROS  Renal/GU negative Renal ROS  negative genitourinary   Musculoskeletal negative musculoskeletal ROS (+)   Abdominal   Peds negative pediatric ROS (+)  Hematology negative hematology ROS (+)   Anesthesia Other Findings   Reproductive/Obstetrics negative OB ROS                             Anesthesia Physical Anesthesia Plan  ASA: III  Anesthesia Plan: General   Post-op Pain Management:    Induction: Intravenous  PONV Risk Score and Plan: 2 and Ondansetron, Dexamethasone and Treatment may vary due to age or medical condition  Airway Management Planned: Oral ETT  Additional Equipment:   Intra-op Plan:   Post-operative Plan: Extubation in OR  Informed Consent: I have reviewed the patients History and Physical, chart, labs and discussed the procedure including the risks, benefits and alternatives for the proposed anesthesia with the patient or authorized representative who has indicated his/her understanding and acceptance.   Dental advisory given  Plan Discussed with: CRNA and Surgeon  Anesthesia Plan Comments:         Anesthesia Quick Evaluation

## 2018-03-06 NOTE — Progress Notes (Signed)
Day of Surgery   Subjective/Chief Complaint:    1 - Metastatic Prostate Cancer - s/p prostatectomy 2011 for Stage 3 disease, then salvage radiation and Abiraterone trial at [facility] until 2015. PSA rise to 30 2018 and resumed androgen deprivation. Has had opinion from [place].   Recent Course:  07/2017 - PET/CT - non-bulbkly Rt ext. iliac and peri-aortic nodes.  10/2017 - PSA 1.55 / T 18   2- End Stage Radiation Cystitis with Hematuria / Clot Retention / Severe Dysuria - pt with severe progressive hematuria with clot retention, severe dysuria with urge incontinence. S/p severeal operative fulgerations and even >20 hyperbaric O2 treatments without symptom releif. He is miserable.   PMH sig for mild CVA (no deficits), no ischemic CV disease / blood thinners. No prior non-GU surgery. His PCP is [name] MD.   Today "Billy Fuller" is seen to proceed with cystectomy and diversion. He complete bowel prep to clear last night. Hgb 11.4. Stomal marking complete.    Objective: Vital signs in last 24 hours: Temp:  [97.6 F (36.4 C)-98.6 F (37 C)] 98.1 F (36.7 C) (09/04 9147) Pulse Rate:  [80-93] 80 (09/04 0614) Resp:  [16-20] 16 (09/04 0614) BP: (112-124)/(61-78) 112/68 (09/04 0614) SpO2:  [96 %-100 %] 96 % (09/04 0614) Weight:  [62.7 kg] 62.7 kg (09/04 0743) Last BM Date: 03/04/18  Intake/Output from previous day: 09/03 0701 - 09/04 0700 In: 598.9 [I.V.:598.9] Out: -  Intake/Output this shift: No intake/output data recorded.    EXAM: NAD, AOx3, Very vigorous for age Non-labored breathing. RLQ Urostomy marking noted. No hernias No c/c/e  Lab Results:  Recent Labs    03/05/18 1053  WBC 5.8  HGB 11.4*  HCT 34.7*  PLT 266   BMET No results for input(s): NA, K, CL, CO2, GLUCOSE, BUN, CREATININE, CALCIUM in the last 72 hours. PT/INR No results for input(s): LABPROT, INR in the last 72 hours. ABG No results for input(s): PHART, HCO3 in the last 72 hours.  Invalid input(s): PCO2,  PO2  Studies/Results: No results found.  Anti-infectives: Anti-infectives (From admission, onward)   Start     Dose/Rate Route Frequency Ordered Stop   03/06/18 0600  piperacillin-tazobactam (ZOSYN) IVPB 3.375 g  Status:  Discontinued     3.375 g 100 mL/hr over 30 Minutes Intravenous On call to O.R. 03/05/18 1057 03/05/18 1058   03/06/18 0600  [MAR Hold]  piperacillin-tazobactam (ZOSYN) IVPB 3.375 g     (MAR Hold since Wed 03/06/2018 at 0736. Reason: Transfer to a Procedural area.)   3.375 g 100 mL/hr over 30 Minutes Intravenous On call to O.R. 03/05/18 1058 03/07/18 0559   03/05/18 1430  metroNIDAZOLE (FLAGYL) tablet 500 mg     500 mg Oral Every 6 hours 03/05/18 1425 03/05/18 2314   03/05/18 1430  neomycin (MYCIFRADIN) tablet 1,000 mg     1,000 mg Oral Every 6 hours 03/05/18 1425 03/05/18 2315   03/05/18 1100  piperacillin-tazobactam (ZOSYN) IVPB 3.375 g  Status:  Discontinued     3.375 g 100 mL/hr over 30 Minutes Intravenous 30 min pre-op 03/05/18 1029 03/05/18 1057      Assessment/Plan:  Proceed as planned with cystectomy and conduit diversion. Risks, benefits, alternatives, expected per-op course discussed in detail previously and reiterated today ,.  [facility], Rayleen Wyrick 03/06/2018

## 2018-03-06 NOTE — Progress Notes (Signed)
Received report from [place], Therapist, sports. Patient resting comfortably. Bed in lowest position and bed alarm within reach. Patient in no acute distress. Will continue to monitor closely.

## 2018-03-06 NOTE — Anesthesia Procedure Notes (Signed)
Procedure Name: Intubation Date/Time: 03/06/2018 8:36 AM Performed by: Sharlette Dense, CRNA Patient Re-evaluated:Patient Re-evaluated prior to induction Oxygen Delivery Method: Circle system utilized Preoxygenation: Pre-oxygenation with 100% oxygen Induction Type: IV induction Ventilation: Mask ventilation without difficulty and Oral airway inserted - appropriate to patient size Laryngoscope Size: Miller and 3 Grade View: Grade I Tube type: Oral Tube size: 8.0 mm Number of attempts: 1 Airway Equipment and Method: Stylet Placement Confirmation: ETT inserted through vocal cords under direct vision,  positive ETCO2 and breath sounds checked- equal and bilateral Secured at: 22 cm Tube secured with: Tape Dental Injury: Teeth and Oropharynx as per pre-operative assessment

## 2018-03-06 NOTE — Op Note (Addendum)
NAME: RUDOLFO, BRANDOW MEDICAL RECORD [id number] ACCOUNT [id number] DATE OF BIRTH:06/20/1946 FACILITY[facility] LOCATION: [facility] PHYSICIAN:Rayce Brahmbhatt [name], MD  OPERATIVE REPORT  DATE OF PROCEDURE:  03/06/2018  PREOPERATIVE DIAGNOSES:   1.  End-stage radiation cystitis.   2.  History of prostate cancer.  PROCEDURE PERFORMED:  Robotic-assisted laparoscopic radical cystectomy with bilateral pelvic lymphadenectomy and ileal conduit urinary diversion .  ASSISTANT: Debbrah Alar PA  ESTIMATED BLOOD LOSS:  100 mL.  COMPLICATIONS:  None.  SPECIMENS: 1.  Cystectomy. 2.  Right external iliac lymph nodes. 3.  Right internal iliac lymph nodes. 4.  Right common iliac lymph nodes. 5.  Right common iliac lymph node  PET avid. 6.  Left common iliac lymph nodes. 7.  Left internal iliac lymph nodes. 8.  Left external iliac lymph nodes. 9.  Left obturator lymph nodes. 10.  Right distal ureteral margin. 11.  Left distal ureteral margin.  INDICATIONS:  The patient is a quite pleasant and very vigorous 72 year old gentleman with history of prostate cancer since 2011.  He had a prostatectomy at that time at a referring facility.  He has had a recurrence for which he underwent salvage  radiation.  After salvage radiation, he developed refractory radiation cystitis with urgency, frequency, pain and hematuria.  It had been debilitating and refractory to multiple rounds of operative fulguration as well as dozens of hyperbaric oxygen  treatments.  Options were discussed for management including continued care versus a cystectomy with urinary diversion.  He is at wits end and he adamantly wished to proceed with surgery.  He was admitted yesterday for bowel prep, stomal marking.   DESCRIPTION OF PROCEDURE:  The patient being Billy Fuller , procedure being robotic cystectomy with conduit urinary diversion and pelvic lymphadenectomy was confirmed.  Procedure timeout was performed and antibiotics  administered.  General endotracheal  anesthesia induced.  The patient was placed into a low lithotomy position and sterile field was created prepped and draped his penis, perineum and proximal thighs using iodine and his infraxiphoid area using chlorhexidine gluconate.  He was further  fastened to the OR table using 3-inch tape over foam padding across the supraxiphoid chest.  His arms were tucked to the side using gel rolls.  A Foley catheter was placed per urethra to straight drain.  The patient was then placed into steep  Trendelenburg positioning was easily positioned.  Next a high-flow, low-pressure pneumoperitoneum was obtained using Veress technique in the right hemiabdomen away from prior surgical sites.  A 15 mm port was then placed at the area of his previously  marked stomal site and laparoscopic examination of the peritoneal cavity was performed via this site.  There were actually minimal adhesions.  There were some loose adhesions of the descending colon to the pelvic sidewall.  Distal ports were placed as  follows:  Left paramedian 8 mm robotic port, left far lateral 8 mm robotic port, supraumbilical 8 mm camera port and right upper lateral 12 mm AirSeal assist port and a right paramedian 8 mm robotic port.  Robot was docked and passed electronic checks.   Attention was directed to development of the left side of the retroperitoneum.  Incision was made lateral to the descending colon from the area of the iliac vessels distally towards the area of the medial umbilical ligament and then proximally for a  distance approximately 10 cm above the iliac crossing.  The peritoneal flap was used as a bucket handle for retraction and access to the retroperitoneum.  The  left ureter was encountered as it coursed over the iliac vessels, marked a vessel loop,  dissected distally to the ureterovesical junction, which was doubly clipped and ligated the proximal tag dyed suture.  It was then carefully mobilized  circumferentially above the iliac vessel a distance of an additional 6 cm taking  exquisite care to  avoid excessive skeletization or devascularization of the ureter which did not occur grossly.  The ureter outside the left pelvis, attention was directed to the left side of the lymphadenectomy, given the patient's history of aggressive prostate cancer.   First lymphatic tissue was encountered within the boundaries of the left external iliac artery, vein, pelvic sidewall, iliac bifurcation was mobilized achieved with cold clips,  set aside labeled left external iliac lymph nodes.  Next tissue within the  boundaries of the left external iliac vein, pelvic sidewall and obturator nerve were mobilized.  Lymphostasis was achieved with clips, set aside, labeled left obturator lymph nodes.  The obturator nerve was inspected following maneuvers and was found to  be uninjured.  Next, the tissue overlying the area of the left common iliac vessels from the area of the aortic bifurcation to the iliac bifurcation was dissected free and labeled as such and lastly tissue overlying the left internal iliac vessel from  the area of the iliac bifurcation to the area of the superior vesicle artery was dissected free and set aside, labeled as such.  Similarly on the right side, a posterior peritoneal incision was made lateral to the ascending colon from the iliac vessels  superiorly.  This was approximately 8 cm inferiorly lateral to the right medial umbilical ligament.  This was used as a posterior peritoneal flap and the right ureter was encountered, marked with a vessel loop, dissected distally to the ureterovesical  junction where a single and dual clipped and ligated with a white tagged suture and then this distance was approximately 2 cm above the area of the iliac vessels and tucked out of the deep pelvis.  Attention was directed to the right sided  lymphadenectomy which was performed exactly as per the left.  The right  obturator nerve was inspected and on maneuver was found to be uninjured.  Additional tissue was taken from an area lateral to the right common iliac vessel as it appeared to be a PET  avid lymph node in this location.  This set aside and labeled as such.  The same posterior peritoneal window was then used to trace up to the level of the aortic bifurcation and the left ureter was then transitioned retroperitoneally via the  retroperitoneum window to the area of the right hemipelvis.  The ileocecal junction was identified and an area of terminal ileum 15 cm proximal to this but appeared to have suitable mobilization for a conduit was marked with a tagged chromic suture and  the tagged chromic suture and the 2 ureteral tag sutures were then placed into a single clip in the right pelvis.  Attention was directed at posterior peritoneal flap deep to prior inferolateral peritoneal incisions were then connected in the midline and  posterior peritoneal flap was carefully developed from the area of approximately the mid bladder towards the area of the bladder neck.  Exquisite care was taken to the posterior dissection to avoid any rectal violation and this plane of dissection  purposely stayed just in the superficial most perirectal fat.  This exposed the vascular pedicles of the bladder bilaterally and they were controlled using vascular stapler x1  on each side and revealed an excellent hemostatic control.  Additional lateral  dissection was performed separating the bladder to the pelvic sidewall on each side.  Towards the inferior portion of this, as expected, there was significant desmoplastic reaction consistent with prior radiation and final anterior dissection was  performed revealing space of Retzius between the medial umbilical ligaments inferiorly.  At the edge of the bladder neck and the anterior plane, the bladder was purposely entered and a plane was chosen for cystectomy.  I purposely left a distal,   approximately 2 cm, of the bladder neck as this was in direct apposition to the area of prostate resection and maximal radiation dose and this was purposely done to prevent rectal violation.  This completely freed up the cystectomy portion of the  specimen and was placed in an EndoCatch bag for later retrieval.  Digital rectal exam was performed under laparoscopic vision indicator glove, no evidence of rectal violation was noted.  The small portion of the bladder neck membranous urethral area was  oversewn using 3-0 V-Loc to prevent unpleasant penile drainage.  A closed suction drain was brought out the previous left lateral most robotic port site near the peritoneal cavity.  The specimen bag string was brought through the left paramedian robotic  port site and the bilateral ureters and distal ileum tags sutures were placed into a laparoscopic grasper via the 15 mm port site.  Robot was then undocked.  Specimen was retrieved by extending the previous camera port site inferiorly on the left side of  the umbilicus for a distance of approximately 6 cm, removing the cystectomy specimen, setting aside for permanent pathology.  A wound protector was then applied through this.  Bilateral ureters were brought through this extraction site as was the area  of the conduit  distal ileum.  Attention was directed at conduit formation.  A segment of ileum 15 cm in length was then taken out of continuity at the previously marked site with a green load stapler proximally and distally.  The mesentery was  developed.  A 2 loads contour stapler distally, one load proximally, taking exquisite care to avoid devascularization of the conduit or anastomotic segments.  The conduit was laid into retroperitoneal orientation and bowel/bowel anastomosis was performed  using side-to-side bowel stapler x2 which resulted in excellent palpably patent, visibly viable bowel/bowel anastomosis.  The acute angle of this was bolstered with a  single silk stitch.  The free end was oversewn with running silk, followed by a second  imbricating layer of running silk.  The mesenteric defect was reapproximated using interrupted silk to avoid internal herniation and the bowel/bowel anastomosis redelivered into the abdomen.  The proximal staple line was excluded from the conduit using  running 3-0 Vicryl.  The distal staple line was removed.  Attention was directed to the ureteroileal anastomosis.  First, the right ureter.  A segment of proximal conduit mucosa and serosa was removed, approximately 4 mm in length and 4 mucosal everting  sutures applied.  The right ureter was then spatulated purposely a distance of approximately 3 cm proximal to the distal dissection to anastomose an area of the ureter presumed to have maximum vascularity and the farthest away from the prior radiation  field.  The edges of this appeared to be simply viable and vascular.  Heel stitch of 4-0 Vicryl was applied and a red colored bander stent was advanced 25 cm to the anastomosis and 2 separate running suture lines of 4-0 Vicryl  were used in a heel-to-toe  fashion, resulting in excellent mucosa-to-mucosa apposition.  A single chromic suture was applied through and through the conduit and bander stent to prevent early invert and retrieval.  Similarly, left ureteroileal anastomosis was performed as per the  right.  Purposely choosing an area of ureter approximately 3 cm proximal to the final dissection but using a blue colored bander stent 25 cm to the anastomosis, also anchoring with interrupted chromic.  The previous 15 mm port site, which had been placed  at the prior stomal site was excised of a diameter of approximately 1/4 from the skin to the area of the fascia, which was dilated to accommodate 2 surgeon's fingers in the distal conduit segment with a bandage in situ was brought through this.  This  was anchored to the level of fascia x4 to prevent parastomal hernia and  set aside.  Abdomen was once again inspected the extraction site.  Hemostasis appeared excellent.  Sponge, needle counts were correct.  The fascia was then closed using interrupted  PDS x6 after placing omentum directly over the extraction site.  Scarpa's was reapproximated with running Vicryl and the skin was reapproximated using subcuticular Monocryl by Dermabond at this site and all prior port sites.  Attention was directed at  the conduit formation.  Four rows of sutures were applied with 2-0 Vicryl, which revealed an excellent rose budding of the stoma with interrupted 3-0 Vicryl to achieve bowel mucosa to skin apposition circumferentially.  The ostomy appliance was applied.   Hemostasis appeared excellent.  The drain was connected to bulb suction and the procedure terminated.    The patient tolerated the procedure well.  No immediate complications.  The patient was taken to [facility] in stable condition with plan for admission.  Please note first assistant, Debbrah Alar PA was crucial for all robotic and open parts of the procedure. She provided vascular stapling, retraction, specimen manipulation suctioning, suture passage, and first assistance without which this would not be possible.   AN/NUANCE  D:03/06/2018 T:03/06/2018 JOB:[id number]

## 2018-03-06 NOTE — Transfer of Care (Signed)
Immediate Anesthesia Transfer of Care Note  Patient: Billy Fuller  Procedure(s) Performed: XI ROBOTIC ASSISTED LAPAROSCOPIC COMPLETE CYSTECT ILEAL CONDUIT (N/A ) LYMPHADENECTOMY (Bilateral )  Patient Location: PACU  Anesthesia Type:General  Level of Consciousness: awake  Airway & Oxygen Therapy: Patient Spontanous Breathing and Patient connected to face mask oxygen  Post-op Assessment: Report given to RN and Post -op Vital signs reviewed and stable  Post vital signs: Reviewed and stable  Last Vitals:  Vitals Value Taken Time  BP 138/117 03/06/2018  1:55 PM  Temp    Pulse 88 03/06/2018  1:57 PM  Resp 15 03/06/2018  1:57 PM  SpO2 100 % 03/06/2018  1:57 PM  Vitals shown include unvalidated device data.  Last Pain:  Vitals:   03/06/18 0743  TempSrc:   PainSc: 0-No pain      Patients Stated Pain Goal: 2 (47/20/72 1828)  Complications: No apparent anesthesia complications

## 2018-03-06 NOTE — Anesthesia Postprocedure Evaluation (Signed)
Anesthesia Post Note  Patient: Billy Fuller  Procedure(s) Performed: XI ROBOTIC ASSISTED LAPAROSCOPIC COMPLETE CYSTECT ILEAL CONDUIT (N/A ) LYMPHADENECTOMY (Bilateral )     Patient location during evaluation: PACU Anesthesia Type: General Level of consciousness: awake and alert Pain management: pain level controlled Vital Signs Assessment: post-procedure vital signs reviewed and stable Respiratory status: spontaneous breathing, nonlabored ventilation, respiratory function stable and patient connected to nasal cannula oxygen Cardiovascular status: blood pressure returned to baseline and stable Postop Assessment: no apparent nausea or vomiting Anesthetic complications: no    Last Vitals:  Vitals:   03/06/18 1500 03/06/18 1515  BP: 137/71 115/70  Pulse: 88 86  Resp: 12 12  Temp:  37.1 C  SpO2: 100% 100%    Last Pain:  Vitals:   03/06/18 1500  TempSrc:   PainSc: Asleep                 Avanna Sowder S

## 2018-03-06 NOTE — Brief Op Note (Signed)
03/05/2018 - 03/06/2018  1:33 PM  PATIENT:  Billy Fuller  72 y.o. male  PRE-OPERATIVE DIAGNOSIS:  END STAGE RADIATION CYSTITIS  POST-OPERATIVE DIAGNOSIS:  END STAGE RADIATION CYSTITIS  PROCEDURE:  Procedure(s): XI ROBOTIC ASSISTED LAPAROSCOPIC COMPLETE CYSTECT ILEAL CONDUIT (N/A) LYMPHADENECTOMY (Bilateral)  SURGEON:  Surgeon(s) and Role:    Alexis Frock, MD - Primary  PHYSICIAN ASSISTANT:   ASSISTANTS: Debbrah Alar PA   ANESTHESIA:   local and general  EBL:  350 mL   BLOOD ADMINISTERED:none  DRAINS: 1 - Urostomy to gravity, 2 - JP to bulb   LOCAL MEDICATIONS USED:  MARCAINE     SPECIMEN:  Source of Specimen:  1 - cystectomy, 2 - pelvic lymph nodes, 3 - ureteral margins  DISPOSITION OF SPECIMEN:  PATHOLOGY  COUNTS:  YES  TOURNIQUET:  * No tourniquets in log *  DICTATION: .Other Dictation: Dictation Number [id number]  PLAN OF CARE: Admit to inpatient   PATIENT DISPOSITION:  PACU - hemodynamically stable.   Delay start of Pharmacological VTE agent (>24hrs) due to surgical blood loss or risk of bleeding: yes

## 2018-03-07 ENCOUNTER — Encounter (HOSPITAL_COMMUNITY): Payer: Self-pay | Admitting: Urology

## 2018-03-07 LAB — CBC
HCT: 27.1 % — ABNORMAL LOW (ref 39.0–52.0)
Hemoglobin: 9 g/dL — ABNORMAL LOW (ref 13.0–17.0)
MCH: 27.9 pg (ref 26.0–34.0)
MCHC: 33.2 g/dL (ref 30.0–36.0)
MCV: 83.9 fL (ref 78.0–100.0)
Platelets: 247 10*3/uL (ref 150–400)
RBC: 3.23 MIL/uL — AB (ref 4.22–5.81)
RDW: 15.3 % (ref 11.5–15.5)
WBC: 10.8 10*3/uL — ABNORMAL HIGH (ref 4.0–10.5)

## 2018-03-07 LAB — BASIC METABOLIC PANEL
Anion gap: 7 (ref 5–15)
BUN: 15 mg/dL (ref 8–23)
CHLORIDE: 104 mmol/L (ref 98–111)
CO2: 28 mmol/L (ref 22–32)
Calcium: 8.4 mg/dL — ABNORMAL LOW (ref 8.9–10.3)
Creatinine, Ser: 0.87 mg/dL (ref 0.61–1.24)
GFR calc Af Amer: >60 mL/min (ref >60)
GFR calc non Af Amer: >60 mL/min (ref >60)
Glucose, Bld: 139 mg/dL — ABNORMAL HIGH (ref 70–99)
POTASSIUM: 4.3 mmol/L (ref 3.5–5.1)
Sodium: 139 mmol/L (ref 135–145)

## 2018-03-07 MED ORDER — SODIUM CHLORIDE 0.9 % IV SOLN
INTRAVENOUS | Status: DC | PRN
  Administered 2018-03-07 – 2018-03-12 (×2): 250 mL via INTRAVENOUS

## 2018-03-07 MED ORDER — ENSURE SURGERY PO LIQD
237.0000 mL | Freq: Two times a day (BID) | ORAL | Status: DC
  Filled 2018-03-07 (×13): qty 237

## 2018-03-07 MED ORDER — PROMETHAZINE HCL 25 MG/ML IJ SOLN: 12.5000 mg | Freq: Four times a day (QID) | INTRAMUSCULAR | Status: DC | PRN

## 2018-03-07 MED ORDER — ADULT MULTIVITAMIN W/MINERALS CH
1.0000 | ORAL_TABLET | Freq: Every day | ORAL | Status: DC
  Administered 2018-03-10: 1 via ORAL
  Filled 2018-03-07 (×5): qty 1

## 2018-03-07 MED ORDER — ORAL CARE MOUTH RINSE
15.0000 mL | Freq: Two times a day (BID) | OROMUCOSAL | Status: DC
  Administered 2018-03-07: 30 mL via OROMUCOSAL
  Administered 2018-03-07 – 2018-03-12 (×8): 15 mL via OROMUCOSAL

## 2018-03-07 NOTE — Progress Notes (Signed)
Initial Nutrition Assessment  DOCUMENTATION CODES:   Severe malnutrition in context of chronic illness  INTERVENTION:  - Diet advancement as medically feasible. - Will order Ensure Surgery BID for once diet advanced to at least FLD, each supplement provides 330 kcal and 18 grams of protein. - Will order daily multivitamin with minerals.    NUTRITION DIAGNOSIS:   Severe Malnutrition related to chronic illness, catabolic illness, cancer and cancer related treatments as evidenced by moderate fat depletion, moderate muscle depletion, severe muscle depletion, percent weight loss.  GOAL:   Patient will meet greater than or equal to 90% of their needs  MONITOR:   Diet advancement, Weight trends, Labs, I & O's  REASON FOR ASSESSMENT:   Malnutrition Screening Tool  ASSESSMENT:   72 year-old with PMH of CVA, prostate cancer s/p prostatectomy in 2011 and radiation and Abiraterone trial at [facility] until 2015. Patient with end stage radiation cystitis, clot retention, and severe dysuria. Presented 9/3 and s/p ileal conduit 9/4.  BMI indicates normal weight. Patient has been NPO since midnight yesterday. He was on CLD on 9/3. Billy Fuller RD had attempted to reach patient by phone x2 in July to set up an outpatient nutrition appointment but was unable to reach patient. Dr. Jeffie Fuller had alerted [name] that patient was experiencing anorexia/poor appetite and was losing weight.  Patient reports that for the past 5 months he has had a decreased appetite and bland taste sensation with PO intakes. Patient greatly enjoys spicy foods, acidic foods, and enjoys drinking beer socially with friends or at his vacation home. He reports that 3 months ago he was told to avoid spicy food, acidic food, and beer d/t irritation to his bladder. Because of this, he has not been eating well because of bland taste and inability to consume foods that are enjoyable to him. He always eats well at breakfast and lunch and dinner most  days would be soup or a sandwich.   He reports some pain around surgical site but otherwise no abdominal pain and no nausea. He was having daily abdominal pain for the past 6 months. Pain was not exacerbated by PO intakes and did not cause nausea. He states now that he has had surgery he was informed that he can begin eating whatever he would like and he feels very confident that PO intakes will improve and appetite will go back to normal.   Patient reports 30 lb weight loss in the past 5 months. Per chart review, current weight is 138 lb and weight on 6/10 was 164 lb. Patient feels that it has been closer to 5 months since he lost this weight. This indicates 26 lb weight loss (16% body weight) in the past 3-5 months; significant for time frame in either case.   Medications reviewed. Labs reviewed; Ca: 8.4 mg/dL. IVF; LR @ 100 mL/hr.       NUTRITION - FOCUSED PHYSICAL EXAM:    Most Recent Value  Orbital Region  No depletion  Upper Arm Region  Moderate depletion  Thoracic and Lumbar Region  Unable to assess  Buccal Region  Mild depletion  Temple Region  No depletion  Clavicle Bone Region  Severe depletion  Clavicle and Acromion Bone Region  Severe depletion  Scapular Bone Region  Unable to assess  Dorsal Hand  Mild depletion  Patellar Region  Moderate depletion  Anterior Thigh Region  Unable to assess  Posterior Calf Region  Moderate depletion  Edema (RD Assessment)  None  Hair  Reviewed  Eyes  Reviewed  Mouth  Reviewed  Skin  Reviewed  Nails  Reviewed       Diet Order:   Diet Order            Diet NPO time specified Except for: Ice Chips  Diet effective now              EDUCATION NEEDS:   No education needs have been identified at this time  Skin:  Skin Assessment: Reviewed RN Assessment  Last BM:  9/2  Height:   Ht Readings from Last 1 Encounters:  03/06/18 5\' 5"  (1.651 m)    Weight:   Wt Readings from Last 1 Encounters:  03/06/18 62.7 kg    Ideal  Body Weight:  61.82 kg  BMI:  Body mass index is 23.01 kg/m.  Estimated Nutritional Needs:   Kcal:  1880-2070  Protein:  80-90 grams  Fluid:  >/= 1.8 L/day     Billy Matin, MS, RD, LDN, [facility] Inpatient Clinical Dietitian Pager # [phone] After hours/weekend pager # [phone]

## 2018-03-07 NOTE — Progress Notes (Addendum)
Urology Progress Note   1 Day Post-Op s/p RC/IC for refractory radiation cystitis  Subjective:  1. Radiation cystitis, severe - s/p cystectomy w/ ileal conduit urinary diversion. Pt feels very well POD1, minimal pain with only IV tylenol. "best he's felt in years", no further suprapubic pain or dysuria.   2. Dispo - Currently in stepdown, normal vitals. Will need WOCN education.   Objective: Vital signs in last 24 hours: Temp:  [97.6 F (36.4 C)-98.8 F (37.1 C)] 97.9 F (36.6 C) (09/05 0406) Pulse Rate:  [66-90] 79 (09/05 0400) Resp:  [10-20] 16 (09/05 0400) BP: (115-163)/(57-97) 136/66 (09/05 0400) SpO2:  [98 %-100 %] 99 % (09/05 0400) Weight:  [62.7 kg] 62.7 kg (09/04 0743)  Intake/Output from previous day: 09/04 0701 - 09/05 0700 In: 4380 [I.V.:3648.9; IV Piggyback:731.2] Out: 2450 [Urine:1850; Drains:250; Blood:350] Intake/Output this shift: Total I/O In: 1671.6 [I.V.:1009.2; IV Piggyback:662.4] Out: 0881 [Urine:1610; Drains:80]  Physical Exam:  General: Alert and oriented CV: RRR Lungs: Clear Abdomen: Soft, appropriately tender. Port incisions clean dry and intact. RLQ urostomy healthy pink and protuberant, yellow urine in bag. Some residual stool debris around urostomy which is not unexpected. Bilateral ureteral stents visible in bag.  GU: no obvious scrotal edema or insufflation. No obvious penile leakage  Ext: NT, No erythema  Lab Results: Recent Labs    03/05/18 1053 03/06/18 1421 03/07/18 0351  HGB 11.4* 10.3* 9.0*  HCT 34.7* 31.3* 27.1*   BMET Recent Labs    03/06/18 1421 03/07/18 0351  NA 140 139  K 4.7 4.3  CL 106 104  CO2 26 28  GLUCOSE 195* 139*  BUN 16 15  CREATININE 1.02 0.87  CALCIUM 8.4* 8.4*     Studies/Results: No results found.  Assessment/Plan:  72 y.o. male s/p RC/IC on 03/06/18.  Overall doing well post-op.   - sch IV tylenol, breakthrough morphine prn, s/p Exparel intraop - NPO w/ ice chips today - mIVF @ 100 - cont  entereg until ROBF - OOB, ambulation, incentive spirometry today - WOCN for new urostomy teaching - cont x 48 hours of post op IV zosyn - daily labs - transfer from stepdown to floor today   Dispo: expect DC to home in ~3-5 days   LOS: 2 days   Fredricka Bonine 03/07/2018, 6:55 AM

## 2018-03-07 NOTE — Progress Notes (Signed)
Patient transferred to room 1521 via bed.

## 2018-03-07 NOTE — Consult Note (Signed)
[place] Nurse ostomy consult note Stoma type/location: RLQ, ileal conduit Stomal assessment/size: visualized through pouch, aprox. 1 1/2" budded with os pointing towards 6 o'clock. Two stents in place (1) red, (1) blue  Peristomal assessment: NA Treatment options for stomal/peristomal skin: NA Output yellow urine with some green mucous Ostomy pouching: 2pc.  Education provided:  Explained role of Theme park manager of stoma  Education on urine characteristics (sediment, mucous) Demonstrated hooking pouch to nighttime drainage bag  Enrolled patient in [place] program: No  Patient and his wife are very engaged, he is having some postoperative pain, just received IV pain meds while [place] nurse in the room.  Moving to room 1521. I will stop by that room later to drop off educational materials and supplies for pouch change in the am. Wife and I will meet around 1030 with patient for first post op pouch change.   Will need HHRN for support with ostomy teaching at home. They have already mentioned this to me.  [place] Nurse will follow along with you for continued support with ostomy teaching and care [place] MSN, RN, [place], [place], [place]

## 2018-03-08 LAB — CBC
HEMATOCRIT: 27.2 % — AB (ref 39.0–52.0)
HEMOGLOBIN: 8.8 g/dL — AB (ref 13.0–17.0)
MCH: 27.7 pg (ref 26.0–34.0)
MCHC: 32.4 g/dL (ref 30.0–36.0)
MCV: 85.5 fL (ref 78.0–100.0)
Platelets: 264 10*3/uL (ref 150–400)
RBC: 3.18 MIL/uL — AB (ref 4.22–5.81)
RDW: 15.4 % (ref 11.5–15.5)
WBC: 8.6 10*3/uL (ref 4.0–10.5)

## 2018-03-08 LAB — BASIC METABOLIC PANEL
Anion gap: 10 (ref 5–15)
BUN: 12 mg/dL (ref 8–23)
CHLORIDE: 99 mmol/L (ref 98–111)
CO2: 29 mmol/L (ref 22–32)
CREATININE: 0.81 mg/dL (ref 0.61–1.24)
Calcium: 8.3 mg/dL — ABNORMAL LOW (ref 8.9–10.3)
Glucose, Bld: 78 mg/dL (ref 70–99)
POTASSIUM: 4.1 mmol/L (ref 3.5–5.1)
SODIUM: 138 mmol/L (ref 135–145)

## 2018-03-08 MED ORDER — HYDROMORPHONE HCL 1 MG/ML IJ SOLN: 0.5000 mg | INTRAMUSCULAR | Status: DC | PRN

## 2018-03-08 MED ORDER — OXYCODONE HCL 5 MG PO TABS
5.0000 mg | ORAL_TABLET | ORAL | Status: DC | PRN
  Administered 2018-03-08 – 2018-03-13 (×16): 5 mg via ORAL
  Filled 2018-03-08 (×16): qty 1

## 2018-03-08 MED ORDER — KCL IN DEXTROSE-NACL 20-5-0.45 MEQ/L-%-% IV SOLN
INTRAVENOUS | Status: DC
  Administered 2018-03-08 – 2018-03-11 (×8): via INTRAVENOUS
  Filled 2018-03-08 (×10): qty 1000

## 2018-03-08 MED ORDER — HYDROMORPHONE HCL 1 MG/ML IJ SOLN
0.5000 mg | INTRAMUSCULAR | Status: DC | PRN
  Administered 2018-03-08 – 2018-03-12 (×4): 1 mg via INTRAVENOUS
  Filled 2018-03-08 (×5): qty 1

## 2018-03-08 MED ORDER — ACETAMINOPHEN 10 MG/ML IV SOLN
1000.0000 mg | Freq: Four times a day (QID) | INTRAVENOUS | Status: AC
  Administered 2018-03-08 – 2018-03-09 (×4): 1000 mg via INTRAVENOUS
  Filled 2018-03-08 (×4): qty 100

## 2018-03-08 NOTE — Care Management Important Message (Signed)
Important Message  Patient Details  Name: Billy Fuller MRN: [id number] Date of Birth: Dec 28, 1945   Medicare Important Message Given:  Yes    Kerin Salen 03/08/2018, 12:27 [place] Message  Patient Details  Name: Billy Fuller MRN: [id number] Date of Birth: 06/01/1946   Medicare Important Message Given:  Yes    Kerin Salen 03/08/2018, 12:27 PM

## 2018-03-08 NOTE — Evaluation (Signed)
Physical Therapy Evaluation Patient Details Name: Billy Fuller MRN: [id number] DOB: 1946-03-08 Today's Date: 03/08/2018   History of Present Illness  72 yo male s/p cystectomy, ileal conduit 03/06/18. hx of CVA, prostate cancer, falls.   Clinical Impression  On eval, pt was Min guard assist for mobility. He walked ~100 feet with a RW. Some pain with bed mobility. Encouraged pt to self assist as much as possible. Discussed d/c plan-pt will return home with his wife who will assist as needed. Will continue to follow and progress activity as tolerated. Recommend daily ambulation in hallway with nursing supervision as able.     Follow Up Recommendations Supervision for mobility/OOB    Equipment Recommendations  None recommended by PT    Recommendations for Other Services       Precautions / Restrictions Precautions Precautions: Fall Precaution Comments: R ostomy, L jp drain Restrictions Weight Bearing Restrictions: No      Mobility  Bed Mobility Overal bed mobility: Needs Assistance Bed Mobility: Sidelying to Sit   Sidelying to sit: HOB elevated       General bed mobility comments: Cues for pt to self assist and use bedrail as needed. Increaed time. Close guard for safety.   Transfers Overall transfer level: Needs assistance Equipment used: Rolling walker (2 wheeled) Transfers: Sit to/from Stand Sit to Stand: Min guard         General transfer comment: close guard for safety.   Ambulation/Gait Ambulation/Gait assistance: Min guard Gait Distance (Feet): 100 Feet Assistive device: Rolling walker (2 wheeled) Gait Pattern/deviations: Step-through pattern;Decreased stride length     General Gait Details: close guard for safety. slow gait speed. cues for safety, proper use of RW.   Stairs            Wheelchair Mobility    Modified Rankin (Stroke Patients Only)       Balance Overall balance assessment: Mild deficits observed, not formally tested                                           Pertinent Vitals/Pain Pain Assessment: Faces Pain Score: 5  Pain Location: abdomen Pain Descriptors / Indicators: Sore;Tender Pain Intervention(s): Monitored during session;Repositioned    Home Living Family/patient expects to be discharged to:: Private residence Living Arrangements: Spouse/significant other Available Help at Discharge: Family Type of Home: House Home Access: Stairs to enter Entrance Stairs-Rails: None Technical brewer of Steps: 2 Home Layout: One level Home Equipment: Environmental consultant - 2 wheels(walker belongs to wife)      Prior Function Level of Independence: Independent               Hand Dominance        Extremity/Trunk Assessment   Upper Extremity Assessment Upper Extremity Assessment: Overall WFL for tasks assessed    Lower Extremity Assessment Lower Extremity Assessment: Generalized weakness    Cervical / Trunk Assessment Cervical / Trunk Assessment: Normal  Communication   Communication: No difficulties  Cognition Arousal/Alertness: Awake/alert Behavior During Therapy: WFL for tasks assessed/performed Overall Cognitive Status: Within Functional Limits for tasks assessed                                        General Comments      Exercises     Assessment/Plan  PT Assessment Patient needs continued PT services  PT Problem List Decreased balance;Decreased mobility;Decreased activity tolerance;Decreased knowledge of use of DME;Pain       PT Treatment Interventions DME instruction;Gait training;Functional mobility training;Therapeutic activities;Balance training;Patient/family education;Therapeutic exercise    PT Goals (Current goals can be found in the Care Plan section)  Acute Rehab PT Goals Patient Stated Goal: less pain. home.  PT Goal Formulation: With patient/family Time For Goal Achievement: 03/22/18 Potential to Achieve Goals: Good    Frequency  Min 3X/week   Barriers to discharge        Co-evaluation               AM-PAC PT "6 Clicks" Daily Activity  Outcome Measure Difficulty turning over in bed (including adjusting bedclothes, sheets and blankets)?: A Lot Difficulty moving from lying on back to sitting on the side of the bed? : A Lot Difficulty sitting down on and standing up from a chair with arms (e.g., wheelchair, bedside commode, etc,.)?: A Little Help needed moving to and from a bed to chair (including a wheelchair)?: A Little Help needed walking in hospital room?: A Little Help needed climbing 3-5 steps with a railing? : A Little 6 Click Score: 16    End of Session   Activity Tolerance: Patient tolerated treatment well Patient left: in chair;with call bell/phone within reach;with family/visitor present   PT Visit Diagnosis: Other abnormalities of gait and mobility (R26.89);Difficulty in walking, not elsewhere classified (R26.2)    Time: 7939-6886 PT Time Calculation (min) (ACUTE ONLY): 10 min   Charges:   PT Evaluation $PT Eval Moderate Complexity: 1 Mod            Weston Anna, MPT Pager: [phone]

## 2018-03-08 NOTE — Progress Notes (Addendum)
Urology Progress Note   2 Days Post-Op s/p RC/IC for refractory radiation cystitis  Subjective:  1. Radiation cystitis, severe - s/p cystectomy w/ ileal conduit urinary diversion. Pt feels fairly well POD2, some issues with pain levels creeping up. Not captured with 4mg  morphine q2h. May be intraop exparel wearing off.   2. Dispo - Currently floor status, normal vitals. Will need continued WOCN education.   Objective: Vital signs in last 24 hours: Temp:  [97.4 F (36.3 C)-98.5 F (36.9 C)] 98.5 F (36.9 C) (09/06 0417) Pulse Rate:  [75-90] 90 (09/06 0417) Resp:  [15-20] 16 (09/05 2011) BP: (103-142)/(54-72) 123/66 (09/06 0417) SpO2:  [93 %-99 %] 93 % (09/06 0417)  Intake/Output from previous day: 09/05 0701 - 09/06 0700 In: 2900.5 [P.O.:60; I.V.:2635; IV Piggyback:205.4] Out: 2520 [Urine:1955; Drains:565] Intake/Output this shift: No intake/output data recorded.  Physical Exam:  General: Alert and oriented CV: RRR Lungs: Clear Abdomen: Soft, moderate tenderness at suprapubic region, diffuse lower abdomen. Port incisions clean dry and intact. RLQ urostomy healthy pink and protuberant, yellow urine in bag. Some residual stool debris around urostomy which is not unexpected. Bilateral ureteral stents visible in bag.  GU: no obvious scrotal edema or insufflation. No obvious penile leakage  Ext: NT, No erythema  Lab Results: Recent Labs    03/06/18 1421 03/07/18 0351 03/08/18 0558  HGB 10.3* 9.0* 8.8*  HCT 31.3* 27.1* 27.2*   BMET Recent Labs    03/07/18 0351 03/08/18 0558  NA 139 138  K 4.3 4.1  CL 104 99  CO2 28 29  GLUCOSE 139* 78  BUN 15 12  CREATININE 0.87 0.81  CALCIUM 8.4* 8.3*     Studies/Results: No results found.  Assessment/Plan:  72 y.o. male s/p RC/IC on 03/06/18.  Overall doing well post-op.   - sch IV tylenol, will switch to prn IV dilaudid, s/p Exparel intraop - NPO w/ ice chips  - mIVF @ 100 - cont entereg until ROBF - OOB, ambulation,  incentive spirometry today - WOCN for new urostomy teaching - cont x 48 hours of post op IV zosyn - daily labs - transfer from stepdown to floor today   Dispo: expect DC to home in ~3-5 days   LOS: 3 days   Fredricka Bonine 03/08/2018, 7:25 AM

## 2018-03-08 NOTE — Consult Note (Signed)
[facility] Nurse ostomy follow up Stoma type/location: RLQ, ileal conduit  Stomal assessment/size: 1 1/2" budded, pink, moist.  (1) blue stent/(1)red stent  Peristomal assessment: intact  Treatment options for stomal/peristomal skin: 2" barrier ring around the stoma   Output clear, yellow, urine  Ostomy pouching: 1pc convex    Education provided:  Firefighter of stoma  Explained stoma characteristics (budded, flush, color, texture, care)  Explained stents and care for stents Demonstrated pouch change (cutting new barrier, measuring stoma, cleaning peristomal skin and stoma, use of barrier ring) Education on use wick in stoma to keep skin dry with pouch change Patient is independent with connection and disconnection of his pouch to the BSD Education on emptying when 1/3 to 1/2 full and how to empty, he is independent in opening and closing. I have suggested he walk with assistance to the bathroom today  Answered patient/family questions:  Discussed HHRN and obtaining supplies. 5 convex 1 pc urostomy pouches, 5 barrier rings, 1 urine adapter, 1 leg strap and 1 BSD bad in the patients room for DC   Enrolled patient in [place] Start Discharge program: Yes  [facility] Nurse will follow along with you for continued support with ostomy teaching and care [place] MSN, [place], [place], [place], [place]

## 2018-03-09 DIAGNOSIS — E43 Unspecified severe protein-calorie malnutrition: Secondary | ICD-10-CM

## 2018-03-09 LAB — CBC
HEMATOCRIT: 26.6 % — AB (ref 39.0–52.0)
Hemoglobin: 8.7 g/dL — ABNORMAL LOW (ref 13.0–17.0)
MCH: 27.7 pg (ref 26.0–34.0)
MCHC: 32.7 g/dL (ref 30.0–36.0)
MCV: 84.7 fL (ref 78.0–100.0)
Platelets: 256 10*3/uL (ref 150–400)
RBC: 3.14 MIL/uL — ABNORMAL LOW (ref 4.22–5.81)
RDW: 15.3 % (ref 11.5–15.5)
WBC: 5.5 10*3/uL (ref 4.0–10.5)

## 2018-03-09 LAB — BASIC METABOLIC PANEL
Anion gap: 8 (ref 5–15)
BUN: 8 mg/dL (ref 8–23)
CALCIUM: 8.1 mg/dL — AB (ref 8.9–10.3)
CO2: 28 mmol/L (ref 22–32)
CREATININE: 0.7 mg/dL (ref 0.61–1.24)
Chloride: 103 mmol/L (ref 98–111)
GFR calc non Af Amer: >60 mL/min (ref >60)
Glucose, Bld: 136 mg/dL — ABNORMAL HIGH (ref 70–99)
Potassium: 3.9 mmol/L (ref 3.5–5.1)
SODIUM: 139 mmol/L (ref 135–145)

## 2018-03-09 LAB — TYPE AND SCREEN
ABO/RH(D): A POS
Antibody Screen: NEGATIVE
UNIT DIVISION: 0
Unit division: 0

## 2018-03-09 LAB — BPAM RBC
BLOOD PRODUCT EXPIRATION DATE: 201909282359
Blood Product Expiration Date: 201909242359
UNIT TYPE AND RH: 6200
UNIT TYPE AND RH: 6200

## 2018-03-09 NOTE — Progress Notes (Signed)
Physical Therapy Treatment Patient Details Name: Billy Fuller MRN: [id number] DOB: 24-Mar-1946 Today's Date: 03/09/2018    History of Present Illness 72 yo male s/p cystectomy, ileal conduit 03/06/18. hx of CVA, prostate cancer, falls.     PT Comments    Progressing with mobility.    Follow Up Recommendations  Supervision for mobility/OOB     Equipment Recommendations  None recommended by PT    Recommendations for Other Services       Precautions / Restrictions Precautions Precautions: Fall Precaution Comments: R ostomy, L jp drain Restrictions Weight Bearing Restrictions: No    Mobility  Bed Mobility Overal bed mobility: Needs Assistance Bed Mobility: Supine to Sit;Sit to Supine   Sidelying to sit: HOB elevated;Supervision Supine to sit: HOB elevated;Supervision     General bed mobility comments: for safety, lines  Transfers Overall transfer level: Needs assistance Equipment used: Rolling walker (2 wheeled) Transfers: Sit to/from Stand Sit to Stand: Supervision         General transfer comment: for safety.   Ambulation/Gait Ambulation/Gait assistance: Supervision Gait Distance (Feet): 100 Feet Assistive device: Rolling walker (2 wheeled) Gait Pattern/deviations: Step-through pattern;Decreased stride length     General Gait Details: for safety. cues of proper use of walker   Stairs             Wheelchair Mobility    Modified Rankin (Stroke Patients Only)       Balance Overall balance assessment: Mild deficits observed, not formally tested                                          Cognition Arousal/Alertness: Awake/alert Behavior During Therapy: WFL for tasks assessed/performed Overall Cognitive Status: Within Functional Limits for tasks assessed                                        Exercises      General Comments        Pertinent Vitals/Pain Pain Assessment: Faces Faces Pain Scale: Hurts  little more Pain Location: abdomen Pain Descriptors / Indicators: Sore;Tender Pain Intervention(s): Monitored during session;Repositioned    Home Living                      Prior Function            PT Goals (current goals can now be found in the care plan section) Progress towards PT goals: Progressing toward goals    Frequency    Min 3X/week      PT Plan Current plan remains appropriate    Co-evaluation              AM-PAC PT "6 Clicks" Daily Activity  Outcome Measure  Difficulty turning over in bed (including adjusting bedclothes, sheets and blankets)?: A Little Difficulty moving from lying on back to sitting on the side of the bed? : A Little Difficulty sitting down on and standing up from a chair with arms (e.g., wheelchair, bedside commode, etc,.)?: A Little Help needed moving to and from a bed to chair (including a wheelchair)?: A Little Help needed walking in hospital room?: A Little Help needed climbing 3-5 steps with a railing? : A Little 6 Click Score: 18    End of Session   Activity Tolerance: Patient tolerated  treatment well Patient left: in bed;with call bell/phone within reach   PT Visit Diagnosis: Difficulty in walking, not elsewhere classified (R26.2)     Time: 4782-9562 PT Time Calculation (min) (ACUTE ONLY): 17 min  Charges:  $Gait Training: 8-22 mins                        Weston Anna, MPT Pager: [phone]

## 2018-03-09 NOTE — Progress Notes (Signed)
3 Days Post-Op Subjective: No acute events overnight.  The patient reports abdominal soreness, but otherwise his pain is controlled.  He denies nausea/vomiting.  He is passing flatus and asking when he can eat.  Ambulating without difficulty.  Urine output and JP output are both appropriate  Objective: Vital signs in last 24 hours: Temp:  [97.8 F (36.6 C)-98.1 F (36.7 C)] 97.8 F (36.6 C) (09/07 0446) Pulse Rate:  [73-78] 74 (09/07 0446) Resp:  [16-18] 16 (09/07 0446) BP: (101-126)/(58-69) 126/69 (09/07 0446) SpO2:  [96 %-99 %] 97 % (09/07 0446)  Intake/Output from previous day: 09/06 0701 - 09/07 0700 In: 3013.6 [P.O.:10; I.V.:2553.6; IV Piggyback:450] Out: 0277 [Urine:3550; Drains:145]  Intake/Output this shift: No intake/output data recorded.  Physical Exam:  General: Alert and oriented CV: RRR, palpable distal pulses Lungs: CTAB, equal chest rise Abdomen: Soft, NTND, no rebound or guarding Incisions: Clean, dry and intact.   GU:  Urostomy is viable and draining clear-yellow urine.  Bilateral stents in place.  JP drain with serosanguineous output Ext: NT, No erythema  Lab Results: Recent Labs    03/07/18 0351 03/08/18 0558 03/09/18 0601  HGB 9.0* 8.8* 8.7*  HCT 27.1* 27.2* 26.6*   BMET Recent Labs    03/08/18 0558 03/09/18 0601  NA 138 139  K 4.1 3.9  CL 99 103  CO2 29 28  GLUCOSE 78 136*  BUN 12 8  CREATININE 0.81 0.70  CALCIUM 8.3* 8.1*     Studies/Results: No results found.  Assessment/Plan: Postop day 3 status post cystectomy with ileal conduit urinary diversion secondary to radiation cystitis  -Start clear liquid diet.  Continue normal saline at 50 mL/h -Out of bed to chair and ambulate.  Heparin for DVT prophylaxis -Encouraged incentive spirometry -IV Tylenol, Dilaudid and oxycodone ordered for pain controlled -Continue supportive care.  Will continue to monitor   LOS: 4 days   Ellison Hughs, MD [facility] Pager: [phone]  03/09/2018, 8:52 AM

## 2018-03-10 LAB — CBC
HCT: 27.7 % — ABNORMAL LOW (ref 39.0–52.0)
Hemoglobin: 9 g/dL — ABNORMAL LOW (ref 13.0–17.0)
MCH: 27.5 pg (ref 26.0–34.0)
MCHC: 32.5 g/dL (ref 30.0–36.0)
MCV: 84.7 fL (ref 78.0–100.0)
PLATELETS: 284 10*3/uL (ref 150–400)
RBC: 3.27 MIL/uL — AB (ref 4.22–5.81)
RDW: 15.3 % (ref 11.5–15.5)
WBC: 5.4 10*3/uL (ref 4.0–10.5)

## 2018-03-10 LAB — BASIC METABOLIC PANEL
Anion gap: 9 (ref 5–15)
BUN: 6 mg/dL — AB (ref 8–23)
CALCIUM: 8.2 mg/dL — AB (ref 8.9–10.3)
CO2: 27 mmol/L (ref 22–32)
CREATININE: 0.67 mg/dL (ref 0.61–1.24)
Chloride: 103 mmol/L (ref 98–111)
GFR calc Af Amer: >60 mL/min (ref >60)
GFR calc non Af Amer: >60 mL/min (ref >60)
GLUCOSE: 140 mg/dL — AB (ref 70–99)
POTASSIUM: 4 mmol/L (ref 3.5–5.1)
Sodium: 139 mmol/L (ref 135–145)

## 2018-03-10 NOTE — Progress Notes (Signed)
4 Days Post-Op Subjective: No acute events overnight.  The patient is asking when he can eat solid food.  He denies nausea/vomiting and is passing flatus.  No bowel movement yet.  Ambulating without difficulty.  Overall, he states he feels better today.  Renal function and blood counts stable.  Urine output appropriate  Objective: Vital signs in last 24 hours: Temp:  [98.1 F (36.7 C)-98.5 F (36.9 C)] 98.4 F (36.9 C) (09/08 0418) Pulse Rate:  [80-84] 80 (09/08 0418) Resp:  [16-18] 16 (09/08 0418) BP: (118-138)/(72-80) 138/80 (09/08 0418) SpO2:  [97 %-98 %] 97 % (09/08 0418)  Intake/Output from previous day: 09/07 0701 - 09/08 0700 In: 1241.5 [P.O.:540; I.V.:701.5] Out: 3330 [Urine:2850; Drains:480]  Intake/Output this shift: No intake/output data recorded.  Physical Exam:  General: Alert and oriented CV: RRR, palpable distal pulses Lungs: CTAB, equal chest rise Abdomen: Soft, NTND, no rebound or guarding Incisions: clean, dry and intact Gu: Urostomy is viable and draining clear-yellow urine.  Bilateral stents in place.  JP drain with serosanguineous output. Ext: NT, No erythema  Lab Results: Recent Labs    03/08/18 0558 03/09/18 0601 03/10/18 0512  HGB 8.8* 8.7* 9.0*  HCT 27.2* 26.6* 27.7*   BMET Recent Labs    03/09/18 0601 03/10/18 0512  NA 139 139  K 3.9 4.0  CL 103 103  CO2 28 27  GLUCOSE 136* 140*  BUN 8 6*  CREATININE 0.70 0.67  CALCIUM 8.1* 8.2*     Studies/Results: No results found.  Assessment/Plan: Postop day 4 status post cystectomy with ileal conduit urinary diversion secondary to radiation cystitis  -Advance diet to regular. IVF down to 50 mL/hr -Out of bed to chair and ambulate.  Continue heparin for DVT prophylaxis -Continue incentive spirometry -The patient is making excellent progress and will likely be ready for discharge early next week   LOS: 5 days   Ellison Hughs, MD [facility] Pager: [phone]  03/10/2018, 8:48 AM

## 2018-03-11 LAB — BASIC METABOLIC PANEL
Anion gap: 10 (ref 5–15)
BUN: 6 mg/dL — ABNORMAL LOW (ref 8–23)
CALCIUM: 8.4 mg/dL — AB (ref 8.9–10.3)
CO2: 26 mmol/L (ref 22–32)
CREATININE: 0.73 mg/dL (ref 0.61–1.24)
Chloride: 103 mmol/L (ref 98–111)
GLUCOSE: 140 mg/dL — AB (ref 70–99)
Potassium: 4.1 mmol/L (ref 3.5–5.1)
Sodium: 139 mmol/L (ref 135–145)

## 2018-03-11 LAB — CBC
HCT: 29.3 % — ABNORMAL LOW (ref 39.0–52.0)
Hemoglobin: 9.5 g/dL — ABNORMAL LOW (ref 13.0–17.0)
MCH: 27.3 pg (ref 26.0–34.0)
MCHC: 32.4 g/dL (ref 30.0–36.0)
MCV: 84.2 fL (ref 78.0–100.0)
PLATELETS: 305 10*3/uL (ref 150–400)
RBC: 3.48 MIL/uL — ABNORMAL LOW (ref 4.22–5.81)
RDW: 15.2 % (ref 11.5–15.5)
WBC: 5.1 10*3/uL (ref 4.0–10.5)

## 2018-03-11 LAB — CREATININE, FLUID (PLEURAL, PERITONEAL, JP DRAINAGE): Creat, Fluid: 0.7 mg/dL

## 2018-03-11 MED ORDER — SENNOSIDES-DOCUSATE SODIUM 8.6-50 MG PO TABS
1.0000 | ORAL_TABLET | Freq: Two times a day (BID) | ORAL | Status: DC
  Administered 2018-03-11 – 2018-03-12 (×2): 1 via ORAL
  Filled 2018-03-11 (×4): qty 1

## 2018-03-11 NOTE — Consult Note (Signed)
[place] Nurse ostomy follow up Stoma type/location: RLQ round red, edematous, budded, os at center. Two stents intact, red = right, Blue = left. Stomal assessment/size: 1 and 3/8 inches Peristomal assessment: intact with sutures.  Stab wound at 7 o'clock, 4cm from stoma with closely approximated edges. Treatment options for stomal/peristomal skin: skin barrier ring Output clear yellow urine Ostomy pouching: 1pc.convex with skin barrier ring.  Education provided: Review os pouching principles, patient is participating and knows steps, even if he cannot perform.  Assists me with pouch removal, performs cleansing, assists with application and removed tape backing. Disconnects and reattaches to bedside drainage bag.  Will have [place] post discharge from [place].  As his finance has taken the previously provided supplies home, I am leaving one pouch set up (pouch, ring, additional adapter and pattern) at the bedside in the event one is needed prior to discharge.  Patient to take home this set up upon discharge.  Began regular diet today, is walking in hallway and passing flatus.  No BM yet.  Enrolled patient in [place] Start Discharge program: Yes  [facility] nursing team will follow for continued support and teaching, and will remain available to this patient, the nursing and medical teams.    Thank you for involving [place] in the care of this nice gentleman.  Maudie Flakes, MSN, RN, [place], [facility]  Pager# [phone]

## 2018-03-11 NOTE — Progress Notes (Signed)
Physical Therapy Treatment Patient Details Name: Billy Fuller MRN: [id number] DOB: [birth year] Today's Date: 03/11/2018    History of Present Illness 72 yo male s/p cystectomy, ileal conduit 03/06/18. hx of CVA, prostate cancer, falls.     PT Comments    Pt progressing well.  Assisted OOB to amb a greater distance using RW for mild instability.    Follow Up Recommendations  Supervision for mobility/OOB     Equipment Recommendations  None recommended by PT    Recommendations for Other Services       Precautions / Restrictions Precautions Precautions: Fall Precaution Comments: R ostomy, L jp drain Restrictions Weight Bearing Restrictions: No    Mobility  Bed Mobility Overal bed mobility: Needs Assistance Bed Mobility: Supine to Sit     Supine to sit: Supervision     General bed mobility comments: for safety, lines  Transfers Overall transfer level: Needs assistance Equipment used: Rolling walker (2 wheeled) Transfers: Sit to/from [facility] Sit to Stand: Supervision Stand pivot transfers: Supervision       General transfer comment: for safety.   Ambulation/Gait Ambulation/Gait assistance: Supervision Gait Distance (Feet): 125 Feet Assistive device: Rolling walker (2 wheeled) Gait Pattern/deviations: Step-through pattern;Decreased stride length Gait velocity: decreased   General Gait Details: for safety. cues of proper use of walker  prior pt amb without any AD but walker needed for mild instability   Stairs             Wheelchair Mobility    Modified Rankin (Stroke Patients Only)       Balance                                            Cognition Arousal/Alertness: Awake/alert Behavior During Therapy: WFL for tasks assessed/performed Overall Cognitive Status: Within Functional Limits for tasks assessed                                        Exercises      General Comments         Pertinent Vitals/Pain Pain Assessment: Faces Faces Pain Scale: Hurts a little bit Pain Location: abdomen Pain Descriptors / Indicators: Sore;Tender Pain Intervention(s): Monitored during session;Repositioned    Home Living                      Prior Function            PT Goals (current goals can now be found in the care plan section) Progress towards PT goals: Progressing toward goals    Frequency    Min 3X/week      PT Plan Current plan remains appropriate    Co-evaluation              AM-PAC PT "6 Clicks" Daily Activity  Outcome Measure  Difficulty turning over in bed (including adjusting bedclothes, sheets and blankets)?: A Little Difficulty moving from lying on back to sitting on the side of the bed? : A Little Difficulty sitting down on and standing up from a chair with arms (e.g., wheelchair, bedside commode, etc,.)?: A Little Help needed moving to and from a bed to chair (including a wheelchair)?: A Little Help needed walking in hospital room?: A Little Help needed climbing 3-5 steps with a railing? :  A Little 6 Click Score: 18    End of Session Equipment Utilized During Treatment: Gait belt Activity Tolerance: Patient tolerated treatment well Patient left: in chair;with call bell/phone within reach Nurse Communication: Mobility status PT Visit Diagnosis: Difficulty in walking, not elsewhere classified (R26.2)     Time: 7867-6720 PT Time Calculation (min) (ACUTE ONLY): 13 min  Charges:  $Gait Training: 8-22 mins                     Rica Koyanagi  PTA [facility]  Acute  Rehab Pager      [phone]

## 2018-03-11 NOTE — Progress Notes (Signed)
5 Days Post-Op Subjective: No acute events overnight.  +flatus, no BM. Weak appetite but able to take PO fluids. Excellent UOP. Ambulating well. No major concerns from patient.   Objective: Vital signs in last 24 hours: Temp:  [97.7 F (36.5 C)-98.9 F (37.2 C)] 98.2 F (36.8 C) (09/09 1436) Pulse Rate:  [83-95] 95 (09/09 1436) Resp:  [16-18] 16 (09/09 1436) BP: (127-133)/(72-83) 127/83 (09/09 1436) SpO2:  [97 %-100 %] 100 % (09/09 1436)  Intake/Output from previous day: 09/08 0701 - 09/09 0700 In: 340 [P.O.:340] Out: 3470 [Urine:3150; Drains:320]  Intake/Output this shift: Total I/O In: 457.7 [I.V.:457.7] Out: 805 [Urine:725; Drains:80]  Physical Exam:  General: Alert and oriented CV: RRR, palpable distal pulses Lungs: CTAB, equal chest rise Abdomen: Soft, NTND, no rebound or guarding Incisions: clean, dry and intact Gu: Urostomy is viable and draining clear-yellow urine.  Bilateral stents in place.  JP drain with serosanguineous output. Ext: NT, No erythema  Lab Results: Recent Labs    03/09/18 0601 03/10/18 0512 03/11/18 0517  HGB 8.7* 9.0* 9.5*  HCT 26.6* 27.7* 29.3*   BMET Recent Labs    03/10/18 0512 03/11/18 0517  NA 139 139  K 4.0 4.1  CL 103 103  CO2 27 26  GLUCOSE 140* 140*  BUN 6* 6*  CREATININE 0.67 0.73  CALCIUM 8.2* 8.4*     Studies/Results: No results found.  Assessment/Plan: Postop day 5 status post cystectomy with ileal conduit urinary diversion secondary to radiation cystitis  - medlock, thin diet -Out of bed to chair and ambulate.  Continue heparin for DVT prophylaxis -Continue incentive spirometry - WOCN education today - AROBF - anticipate DC in the next couple of days   LOS: 6 days     03/11/2018, 3:24 PM

## 2018-03-11 NOTE — Care Management Important Message (Signed)
Important Message  Patient Details  Name: Billy Fuller MRN: [id number] Date of Birth: 31-Mar-1946   Medicare Important Message Given:  Yes    Kerin Salen 03/11/2018, 11:54 AMImportant Message  Patient Details  Name: Billy Fuller MRN: [id number] Date of Birth: [birth year]   Medicare Important Message Given:  Yes    Kerin Salen 03/11/2018, 11:54 AM

## 2018-03-11 NOTE — Care Management Note (Addendum)
Case Management Note  Patient Details  Name: Billy Fuller MRN: [id number] Date of Birth: 02-18-1946  Subjective/Objective:                  5 Days Post-Op Subjective: No acute events overnight.  +flatus, no BM. Weak appetite but able to take PO fluids. Excellent UOP. Ambulating well. No major concerns from patient.  Action/Plan: Home with RN through [facility]  Expected Discharge Date:                  Expected Discharge Plan:  [place]  In-House Referral:     Discharge planning Services  CM Consult  Post Acute Care Choice:  Home Health Choice offered to:  Patient  DME Arranged:    DME Agency:     HH Arranged:  RN [place] Agency:  [facility]  Status of Service:  Completed, signed off  If discussed at [place] of Stay Meetings, dates discussed:    Additional Comments:  Leeroy Cha, RN 03/11/2018, 11:45 AM

## 2018-03-12 LAB — BASIC METABOLIC PANEL
Anion gap: 14 (ref 5–15)
BUN: 11 mg/dL (ref 8–23)
CALCIUM: 8.2 mg/dL — AB (ref 8.9–10.3)
CO2: 20 mmol/L — ABNORMAL LOW (ref 22–32)
Chloride: 103 mmol/L (ref 98–111)
Creatinine, Ser: 1.15 mg/dL (ref 0.61–1.24)
GLUCOSE: 264 mg/dL — AB (ref 70–99)
Potassium: 4.5 mmol/L (ref 3.5–5.1)
Sodium: 137 mmol/L (ref 135–145)

## 2018-03-12 LAB — OCCULT BLOOD X 1 CARD TO LAB, STOOL: FECAL OCCULT BLD: NEGATIVE

## 2018-03-12 LAB — HEMOGLOBIN AND HEMATOCRIT, BLOOD
HCT: 28.2 % — ABNORMAL LOW (ref 39.0–52.0)
Hemoglobin: 9.1 g/dL — ABNORMAL LOW (ref 13.0–17.0)

## 2018-03-12 LAB — GLUCOSE, CAPILLARY: GLUCOSE-CAPILLARY: 255 mg/dL — AB (ref 70–99)

## 2018-03-12 MED ORDER — PIPERACILLIN-TAZOBACTAM 3.375 G IVPB
3.3750 g | Freq: Three times a day (TID) | INTRAVENOUS | Status: DC
  Administered 2018-03-12 – 2018-03-13 (×3): 3.375 g via INTRAVENOUS
  Filled 2018-03-12 (×4): qty 50

## 2018-03-12 MED ORDER — BISACODYL 10 MG RE SUPP
10.0000 mg | Freq: Once | RECTAL | Status: AC
  Administered 2018-03-12: 10 mg via RECTAL
  Filled 2018-03-12: qty 1

## 2018-03-12 NOTE — Progress Notes (Signed)
6 Days Post-Op   Subjective/Chief Complaint:  1 - End Stage Radiation Cystitis / Metastatic Prostate Cancer - s/p robotic cystectomy, pelvic node dissection, conduit diversion 03/06/18 for end stage radiation cystitis. Path benign bladder and metastatic prostate cancer in several lymph nodes. Admitted day priro for bowel prep / marking. ICU POD 0 transitioned to med - surg unit. NPO initially, advanced to cleras POD 3, then regular POD 4 as copiosu flatus and then resumed bowel function POD6. JP Cr same as serum POD6.   2 - Disposition - independent in all ADL's at baseline. Home Health pending for new ostomy teachign at discharge.  3 - Vagal Episode - pt with acute hypotension after BM 03/12/18. Normal Sats, EKG sinus tach only (after recovered), no fevers, Hgb 9.5.  Today "Billy Fuller" is was doing well untile Vagal episode abotu 15 mins ago. He is AOx3 at present. Sat 100% room air.    Objective: Vital signs in last 24 hours: Temp:  [97.8 F (36.6 C)-98.3 F (36.8 C)] 97.8 F (36.6 C) (09/10 0946) Pulse Rate:  [80-108] 108 (09/10 1100) Resp:  [16-20] 18 (09/10 0946) BP: (65-142)/(50-90) 65/50 (09/10 1100) SpO2:  [97 %-100 %] 97 % (09/10 1100) Last BM Date: 03/12/18  Intake/Output from previous day: 09/09 0701 - 09/10 0700 In: 577.7 [P.O.:120; I.V.:457.7] Out: 1093 [Urine:2200; Drains:275] Intake/Output this shift: Total I/O In: 29 [P.O.:60] Out: 101 [Drains:100; Stool:1]  EXAM: NAD, AOx3,  regualr tachycardia by EKG Non-labored breathign on RA Recent incisions c/d/i. RLQ Urostomy pink / patent of yellow urien with Rt (red) and Lt (blue) stents in place JP with stable serosanguinous efflux that is non-foull  SCD's in place, no c/c/e.   Lab Results:  Recent Labs    03/10/18 0512 03/11/18 0517  WBC 5.4 5.1  HGB 9.0* 9.5*  HCT 27.7* 29.3*  PLT 284 305   BMET Recent Labs    03/10/18 0512 03/11/18 0517  NA 139 139  K 4.0 4.1  CL 103 103  CO2 27 26  GLUCOSE 140* 140*   BUN 6* 6*  CREATININE 0.67 0.73  CALCIUM 8.2* 8.4*   PT/INR No results for input(s): LABPROT, INR in the last 72 hours. ABG No results for input(s): PHART, HCO3 in the last 72 hours.  Invalid input(s): PCO2, PO2  Studies/Results: No results found.  Anti-infectives: Anti-infectives (From admission, onward)   Start     Dose/Rate Route Frequency Ordered Stop   03/06/18 1600  piperacillin-tazobactam (ZOSYN) IVPB 3.375 g     3.375 g 12.5 mL/hr over 240 Minutes Intravenous Every 8 hours 03/06/18 1540 03/08/18 1153   03/06/18 0600  piperacillin-tazobactam (ZOSYN) IVPB 3.375 g  Status:  Discontinued     3.375 g 100 mL/hr over 30 Minutes Intravenous On call to O.R. 03/05/18 1057 03/05/18 1058   03/06/18 0600  piperacillin-tazobactam (ZOSYN) IVPB 3.375 g     3.375 g 100 mL/hr over 30 Minutes Intravenous On call to O.R. 03/05/18 1058 03/06/18 0855   03/05/18 1430  metroNIDAZOLE (FLAGYL) tablet 500 mg     500 mg Oral Every 6 hours 03/05/18 1425 03/05/18 2314   03/05/18 1430  neomycin (MYCIFRADIN) tablet 1,000 mg     1,000 mg Oral Every 6 hours 03/05/18 1425 03/05/18 2315   03/05/18 1100  piperacillin-tazobactam (ZOSYN) IVPB 3.375 g  Status:  Discontinued     3.375 g 100 mL/hr over 30 Minutes Intravenous 30 min pre-op 03/05/18 1029 03/05/18 1057      Assessment/Plan:  1 -  End Stage Radiation Cystitis / Metastatic Prostate Cancer - doing well POD 6. Nearly meeting all criteria for discharge.   2 - Disposition - HHRN pending for discharge liekly home.   3 - Vagal Episode - likely from large BM in setting of recent priro surgery. Hgb, BMP now, restart some IVF. Consider medicine consult if symptoms return.   Remian in nhouse today, likely DC tomorrow as no more vagal episodes.   [facility], Khristian Seals 03/12/2018

## 2018-03-12 NOTE — Significant Event (Signed)
Rapid Response Event Note  Overview: Time Called: 1140 Arrival Time: 1143 Event Type: Hypotension  Initial Focused Assessment: Pt lying in bed,appears very pale, legs slightly mottled, diaphoretic.. Voice is very weak. Pt is oriented, & can move all ext. Pt was going to get up with help to go to BR but was too weak to get OOB,and has now had a large BM. JP on lt side emptied of 100cc bloody dng.  Pt c/o lower abd pain, very tender to touch.    Interventions: Placed on monitor with SR-ST, EKG done shows SR, CBG 255. Dr [name] had been paged. BP 82/56 (64) HR 108 02 sat 99 on RA.  Fluid bolus had been started.  11:55 BP now 107/72 HR 105 R29. 12:10 Dr [name] here, thinks probable vagal response.  Plan of Care (if not transferred): Will cont to watch pt closely, and pt is to stay in bed.  Event Summary: 12:15 BP 99/69 HR 110, R 23, appears more alert, asking what is happening to him.  Reassurance given, explained about the vagal response.   at      at          [place], Jae Dire

## 2018-03-12 NOTE — Progress Notes (Signed)
Bedside report given by [place], RN. No acute issues during rounds. Pt pale, A&Ox4. JP bulb uncharge, Drsg CD+I. Pt states he feels fine after episode today and having a bowel movement. Pt has no concerns at this moment. Pt resting in bed watching tv with call bell within reach.

## 2018-03-12 NOTE — Progress Notes (Signed)
Pts HH is set up and ready for d/c today.

## 2018-03-12 NOTE — Consult Note (Signed)
Noted are the events of this afternoon (vagal episode and hypotension).  Patient is resting comfortably at this time. No questions regarding ostomy.  [place] nursing team will follow, and will remain available to this patient, the nursing and medical teams.   Thanks, Maudie Flakes, MSN, RN, [place], Arther Abbott  Pager# [phone]

## 2018-03-12 NOTE — Progress Notes (Signed)
   03/12/18 1140  MEWS Score  Pulse Rate (!) 108  BP (!) 65/50  SpO2 97 %  O2 Device Room Air  MEWS Score  MEWS RR 0  MEWS Pulse 1  MEWS Systolic 3  MEWS LOC 0  MEWS Temp 0  MEWS Score 4  MEWS Score Color Red  MEWS Assessment  Is this an acute change? Yes  MEWS guidelines implemented *See [place]  Rapid Response Notification  Name of Rapid Response RN Notified Colletta [place] RN  Date Rapid Response Notified 03/12/18  Time Rapid Response Notified 4356  Provider Notification  Provider Name/Title Manny MD  Date Provider Notified 03/12/18  Time Provider Notified 1142  Notification Type Page  Notification Reason Change in status  Response See new orders  Date of Provider Response 03/12/18  Time of Provider Response 1205   Vital Signs MEWS/VS Documentation      03/12/2018 0856 03/12/2018 0946 03/12/2018 1100 03/12/2018 1140   MEWS Score:  0  0  -  4   MEWS Score Color:  Green  Green  -  Red   Resp:  -  18  -  -   Pulse:  -  89  -  (!) 108   BP:  -  (!) 142/90  -  (!) 65/50   Temp:  -  97.8 F (36.6 C)  -  -   O2 Device:  -  Room Air  -  Room Air   Level of Consciousness:  Alert  -  -  -     Rapid response notified, MD paged, new orders followed. Vitals taken by rapid response do not populate into CHL. Will Continue to monitor.      Alyn Riedinger A Eziah Negro 03/12/2018,12:37 PM

## 2018-03-12 NOTE — Progress Notes (Signed)
Rapid response nurse at bedside and MD paged.

## 2018-03-12 NOTE — Progress Notes (Signed)
Rapid Response Event Note  Overview:Alerted by Assistant Department Director that patient was deteriorating in 1521; could I please assess patient. This was @ 1240      Initial Focused Assessment: Patient A/ox4; pale; legs mottled in appearance; patient denies chest pain or SOB; Abdomen non-distended; tender to touch; patient verbalizes increase in pain over last few hours; JP drain with bloody drainage; RN reports increased JP drainage; dressing surrounding JP drain saturated with serous sanguineous drainage; BP 75/51; O2 Sat 100%; P 121; afebrile @ this time; reports of prior temps by primary RN and UOP WNL.   Interventions: Patient given NS 500cc bolus per hypotensive RRT protocol; patient placed in Trendelenburg; EKG obtained from earlier episode and evaluated; labs ordered and drawn; JP drain uncharged at this time; dressing to JP site reinforced.  Patient instructed to not get OOB @ this time and call for any assistance.    Plan of Care (if not transferred): Monitor VS; MD called by primary RN; order given for NS @ 125/hr; labs drawn and to be reported to MD after resulted; Instructed to call MD with any change in patients condition; antibiotics started per MD  Event Summary: 1243-BP 75/41; O2 Sat 100% RA; P 121  1248-79/64; P 116; O2 Sat 100% RA    at  1253-BP 102/64; P111; O2 sat 100% RA    at  1255 BP 112/75; P 100; O2 sat 100% RA 1300-116/71; P 112; O2 sat 100% RA Instructed staff to call if need further support; plan will be to await labs; start IV Antibiotics; NS @ 125/hr; obtain results of lab; monitor Abdomen for distention; increase pain; decreased UOP/BP; increase in pulse or temp.  VSS to be monitored closely and Notify RRT and MD if change in patients condition       Brooklynn [place], Leonor Liv

## 2018-03-12 NOTE — Progress Notes (Addendum)
Rapid Response Nurse called and at bedside. Pt remains hypotensive with large amount of output from [facility]; charted in output. Pt seen by MD with no new orders at this time. Will continue to monitor.

## 2018-03-12 NOTE — Progress Notes (Signed)
Pharmacy Brief Note - Alvimopan (Entereg)  The standing order set for alvimopan (Entereg) now includes an automatic order to discontinue the drug after the patient has had a bowel movement. The change was approved by the [place] and the Medical Executive Committee.   This patient has had bowel movements documented by nursing. Therefore, alvimopan has been discontinued. If there are questions, please contact the pharmacy at [phone].   Thank you-  Orenthal Debski RPh 03/12/2018, 1:24 PM Pager [phone]

## 2018-03-12 NOTE — Progress Notes (Signed)
Pt called out stating he was wet and needed to be changed and that the room was cold. Nursing students assisted pt in changing gown and bed sheets. Pt felt he needed to have a BM and wanted out of bed, upon trying to get out of bed pt became very pale and week. MD paged and rapid response paged. Vitals taken and recorded. EKG taken and placed on chart. Rapid Response at bedside with pt now.

## 2018-03-12 NOTE — Progress Notes (Signed)
S:  Called by NSG again to bedside for hypotension.  Pt AOx3, c/o stbale abd pain. NO CP. NO SOB.   O: NAD. AOx3, Symmetric smile BP 118/80, HR 100 NOn-labored breathign on RA Mild abd TTP that is non-focal Easily Palpable radial and DP pulses bilaterally that are JP output remains thin, serosanguinous EKG sinus tach 108 GUIAC (-)  A/P: DDX Vagal in recovery phase (no brady) v. Early sepsis. Recetn CT reviwed, no AAA. EMR reveiwd no h/o ischemic CV disesae.   1- restart IVF at 125 2- Hgb, BCX, BMP 3- restart Zosyn 4 - FU labs and exam 5 - Will transfer ICU / CC consult if req pressors or deteriorates.

## 2018-03-13 MED ORDER — OXYCODONE-ACETAMINOPHEN 5-325 MG PO TABS: 1.0000 | ORAL_TABLET | ORAL | 0 refills | Status: DC | PRN

## 2018-03-13 MED ORDER — SENNOSIDES-DOCUSATE SODIUM 8.6-50 MG PO TABS: 1.0000 | ORAL_TABLET | Freq: Two times a day (BID) | ORAL | 0 refills | Status: DC

## 2018-03-13 MED ORDER — OXYCODONE-ACETAMINOPHEN 5-325 MG PO TABS: 1.0000 | ORAL_TABLET | Freq: Four times a day (QID) | ORAL | 0 refills | Status: DC | PRN

## 2018-03-13 NOTE — Discharge Instructions (Signed)

## 2018-03-13 NOTE — Progress Notes (Addendum)
Pts JP drain removed per order. Dressing intact with a small amount of drainage. Pt tolerated well.

## 2018-03-13 NOTE — Progress Notes (Addendum)
Pts IV removed with a clean and dry dressing intact. Pt denies pain at the time of d/c with no s/s of distress noted. Pts dressing changed from [place] removal site, dressing was saturated with bright red blood with more drainage when pt moves and coughs. Manny MD paged. MD replied with this is normal for this patient and he is still fine to go home with some extra gauze for the site. Pts dressing was changed and reinforced with steri strips placed to help reduce the amount of drainage.  Nursing tech transported pt by wheelchair to main entrance with SO present. Nurse tech noted bright red blood that dripped onto the wheelchair as pt was getting up into the car to go home. Pt wanted to leave instead of having it rechecked or evaluated. MD aware.

## 2018-03-13 NOTE — Discharge Summary (Signed)
Physician Discharge Summary  Patient ID: Billy Fuller MRN: [id number] DOB/AGE: September 15, 1945 72 y.o.  Admit date: 03/05/2018 Discharge date: 03/13/2018  Admission Diagnoses: radiation cystitis  Discharge Diagnoses:  Active Problems:   S/P ileal conduit (HCC)   Chronic radiation cystitis   Protein-calorie malnutrition, severe   Discharged Condition: good  Hospital Course: Patient underwent a radical cystectomy and ileal conduit on 03/06/18 for a history of severe refractory radiation cystitis. Tolerated surgery well and was admitted to post surgical floor following. Post operative course generally uneventful. His diet was slowly progressed and pain was managed with initially IV medication transitioned to PO. He was able to ambulate independently with adequate strength by discharge. He had a large BM on POD5 and incidentally had transient vasovagal response.  His labs, EKG, and clinical status were all reassuring. By [name] he was meeting all discharge criteria. JP removed as Cr same as serum. Case manager was helpful in arrange home health services for ostomy assistance.   His pathology was discussed with him prior to discharge. He had non-malignant radiation changes of the bladder as expected, although also noted to have +pelvic LNs for metastatic prostate cancer. He had a diagnosis of mCaP prior to admission.   Patient was discharged to home in excellent condition 03/13/18.  He will return in approximately ~2 weeks for post op visit.   Consults: None  Significant Diagnostic Studies: none   Treatments: surgery: radical cystectomy w/ ileal conduit  Discharge Exam: Blood pressure 124/68, pulse 80, temperature 97.9 F (36.6 C), temperature source Oral, resp. rate 18, height 5\' 5"  (1.651 m), weight 62.7 kg, SpO2 100 %. General appearance: alert, cooperative, appears stated age and no distress Resp: normal rate and work of breathing Cardio: regular rate and rhythm, S1, S2 normal, no murmur,  click, rub or gallop Extremities: extremities normal, atraumatic, no cyanosis or edema Abd: urostomy in RLQ pink patent and productive. x2 ureteral stents visible in bag. Robotic port incisions and midline incisions all healing well and c/d/i with surgical glue. JP drain removed and site with some scant serosanguinous spotting that is non-foul   Disposition:       Signed: Fredricka Bonine 03/13/2018, 7:39 AM

## 2018-03-13 NOTE — Progress Notes (Signed)
Pt JP drained 10cc of bloody drainage from uncharged bulb. Drsg had some bloody drng, but drsg not saturated. Abd soft non distended. Pt states he feels much better this AM. Pt does not look as pale as he did during change of shift last night. IVF still infusing per order.

## 2018-03-13 NOTE — Consult Note (Addendum)
[place] Nurse ostomy follow up Patient has been discharged. Ostomy pouch intact without drainage. Pt states he has plenty of supplies at home and has [place] arranged. Bedside RN, [place], removed JP drain per order. Asked me to assess site due to bleeding. Pt has  large ABD dressing over site. Side of dressing saturated with blood that has dripped down. I peeled dressing back to assess and blood is dripping out. Spoke with [place] and suggested a smaller dressing with more pressure be placed. Incision where drain was could use a steri strip. Pt hgb was 9.1 yesterday, [place] RN states labs were not checked today. [place] states she is going to call Dr. [name] before discharging due to concerns. Pt prepared for home pertaining to ostomy care, supplies, and HH follow up. Will keep patient on list in case he does not discharge today.  Fara Olden, RN-C, WTA-C, [facility] Wound Treatment Associate Ostomy Care Associate  Maudie Flakes spoke with Dr. [name] re concerns referenced in note above.    Fara Olden, RN-C, WTA-C, [place] Wound Treatment Associate Ostomy Care Associate

## 2018-03-15 DIAGNOSIS — E43 Unspecified severe protein-calorie malnutrition: Secondary | ICD-10-CM | POA: Diagnosis not present

## 2018-03-15 DIAGNOSIS — Z436 Encounter for attention to other artificial openings of urinary tract: Secondary | ICD-10-CM | POA: Diagnosis not present

## 2018-03-15 DIAGNOSIS — Z48816 Encounter for surgical aftercare following surgery on the genitourinary system: Secondary | ICD-10-CM | POA: Diagnosis not present

## 2018-03-16 ENCOUNTER — Encounter (HOSPITAL_COMMUNITY): Payer: Self-pay

## 2018-03-16 ENCOUNTER — Other Ambulatory Visit: Payer: Self-pay

## 2018-03-16 ENCOUNTER — Inpatient Hospital Stay (HOSPITAL_COMMUNITY)
Admission: EM | Admit: 2018-03-16 | Discharge: 2018-03-19 | DRG: 689 | Disposition: A | Discharge status: Skilled Nursing Facility | Payer: PPO | Attending: Internal Medicine | Admitting: Internal Medicine

## 2018-03-16 DIAGNOSIS — Z936 Other artificial openings of urinary tract status: Secondary | ICD-10-CM

## 2018-03-16 DIAGNOSIS — Z9842 Cataract extraction status, left eye: Secondary | ICD-10-CM

## 2018-03-16 DIAGNOSIS — N3040 Irradiation cystitis without hematuria: Secondary | ICD-10-CM | POA: Diagnosis not present

## 2018-03-16 DIAGNOSIS — E860 Dehydration: Secondary | ICD-10-CM | POA: Diagnosis present

## 2018-03-16 DIAGNOSIS — Z79899 Other long term (current) drug therapy: Secondary | ICD-10-CM | POA: Diagnosis not present

## 2018-03-16 DIAGNOSIS — R627 Adult failure to thrive: Secondary | ICD-10-CM | POA: Diagnosis present

## 2018-03-16 DIAGNOSIS — Z9079 Acquired absence of other genital organ(s): Secondary | ICD-10-CM | POA: Diagnosis not present

## 2018-03-16 DIAGNOSIS — Z436 Encounter for attention to other artificial openings of urinary tract: Secondary | ICD-10-CM | POA: Diagnosis not present

## 2018-03-16 DIAGNOSIS — I639 Cerebral infarction, unspecified: Secondary | ICD-10-CM | POA: Diagnosis not present

## 2018-03-16 DIAGNOSIS — D649 Anemia, unspecified: Secondary | ICD-10-CM | POA: Diagnosis not present

## 2018-03-16 DIAGNOSIS — E43 Unspecified severe protein-calorie malnutrition: Secondary | ICD-10-CM | POA: Diagnosis present

## 2018-03-16 DIAGNOSIS — Z6824 Body mass index (BMI) 24.0-24.9, adult: Secondary | ICD-10-CM

## 2018-03-16 DIAGNOSIS — Z8673 Personal history of transient ischemic attack (TIA), and cerebral infarction without residual deficits: Secondary | ICD-10-CM | POA: Diagnosis not present

## 2018-03-16 DIAGNOSIS — Z961 Presence of intraocular lens: Secondary | ICD-10-CM | POA: Diagnosis present

## 2018-03-16 DIAGNOSIS — Z906 Acquired absence of other parts of urinary tract: Secondary | ICD-10-CM

## 2018-03-16 DIAGNOSIS — R531 Weakness: Secondary | ICD-10-CM

## 2018-03-16 DIAGNOSIS — Z48816 Encounter for surgical aftercare following surgery on the genitourinary system: Secondary | ICD-10-CM | POA: Diagnosis not present

## 2018-03-16 DIAGNOSIS — M6281 Muscle weakness (generalized): Secondary | ICD-10-CM | POA: Diagnosis not present

## 2018-03-16 DIAGNOSIS — Z9841 Cataract extraction status, right eye: Secondary | ICD-10-CM

## 2018-03-16 DIAGNOSIS — Z87891 Personal history of nicotine dependence: Secondary | ICD-10-CM

## 2018-03-16 DIAGNOSIS — C61 Malignant neoplasm of prostate: Secondary | ICD-10-CM | POA: Diagnosis not present

## 2018-03-16 DIAGNOSIS — Z923 Personal history of irradiation: Secondary | ICD-10-CM

## 2018-03-16 DIAGNOSIS — Z8042 Family history of malignant neoplasm of prostate: Secondary | ICD-10-CM

## 2018-03-16 DIAGNOSIS — Z8546 Personal history of malignant neoplasm of prostate: Secondary | ICD-10-CM

## 2018-03-16 DIAGNOSIS — N390 Urinary tract infection, site not specified: Secondary | ICD-10-CM | POA: Diagnosis present

## 2018-03-16 DIAGNOSIS — D62 Acute posthemorrhagic anemia: Secondary | ICD-10-CM | POA: Diagnosis present

## 2018-03-16 DIAGNOSIS — R488 Other symbolic dysfunctions: Secondary | ICD-10-CM | POA: Diagnosis not present

## 2018-03-16 DIAGNOSIS — R2689 Other abnormalities of gait and mobility: Secondary | ICD-10-CM | POA: Diagnosis not present

## 2018-03-16 LAB — URINALYSIS, ROUTINE W REFLEX MICROSCOPIC
Bilirubin Urine: NEGATIVE
GLUCOSE, UA: NEGATIVE mg/dL
Ketones, ur: NEGATIVE mg/dL
Nitrite: NEGATIVE
PH: 7 (ref 5.0–8.0)
PROTEIN: 100 mg/dL — AB
Specific Gravity, Urine: 1.011 (ref 1.005–1.030)

## 2018-03-16 LAB — BASIC METABOLIC PANEL
Anion gap: 11 (ref 5–15)
BUN: 25 mg/dL — AB (ref 8–23)
CALCIUM: 8.7 mg/dL — AB (ref 8.9–10.3)
CO2: 22 mmol/L (ref 22–32)
Chloride: 105 mmol/L (ref 98–111)
Creatinine, Ser: 0.81 mg/dL (ref 0.61–1.24)
GFR calc non Af Amer: >60 mL/min (ref >60)
Glucose, Bld: 122 mg/dL — ABNORMAL HIGH (ref 70–99)
Potassium: 4 mmol/L (ref 3.5–5.1)
Sodium: 138 mmol/L (ref 135–145)

## 2018-03-16 LAB — CBC
HCT: 25.9 % — ABNORMAL LOW (ref 39.0–52.0)
Hemoglobin: 8.3 g/dL — ABNORMAL LOW (ref 13.0–17.0)
MCH: 27 pg (ref 26.0–34.0)
MCHC: 32 g/dL (ref 30.0–36.0)
MCV: 84.4 fL (ref 78.0–100.0)
Platelets: 435 10*3/uL — ABNORMAL HIGH (ref 150–400)
RBC: 3.07 MIL/uL — ABNORMAL LOW (ref 4.22–5.81)
RDW: 15.9 % — ABNORMAL HIGH (ref 11.5–15.5)
WBC: 6.4 10*3/uL (ref 4.0–10.5)

## 2018-03-16 LAB — POC OCCULT BLOOD, ED: Fecal Occult Bld: NEGATIVE

## 2018-03-16 MED ORDER — SODIUM CHLORIDE 0.9 % IV BOLUS
1000.0000 mL | Freq: Once | INTRAVENOUS | Status: AC
  Administered 2018-03-16: 1000 mL via INTRAVENOUS

## 2018-03-16 MED ORDER — SODIUM CHLORIDE 0.9 % IV SOLN
INTRAVENOUS | Status: DC
  Administered 2018-03-17 – 2018-03-18 (×3): via INTRAVENOUS

## 2018-03-16 MED ORDER — OXYCODONE-ACETAMINOPHEN 5-325 MG PO TABS: 1.0000 | ORAL_TABLET | Freq: Four times a day (QID) | ORAL | Status: DC | PRN

## 2018-03-16 MED ORDER — ADULT MULTIVITAMIN W/MINERALS CH
1.0000 | ORAL_TABLET | Freq: Every day | ORAL | Status: DC
  Administered 2018-03-19: 1 via ORAL
  Filled 2018-03-16 (×2): qty 1

## 2018-03-16 MED ORDER — SENNOSIDES-DOCUSATE SODIUM 8.6-50 MG PO TABS: 1.0000 | ORAL_TABLET | Freq: Every evening | ORAL | Status: DC | PRN

## 2018-03-16 MED ORDER — ATORVASTATIN CALCIUM 40 MG PO TABS
40.0000 mg | ORAL_TABLET | Freq: Every day | ORAL | Status: DC
  Administered 2018-03-17 – 2018-03-18 (×2): 40 mg via ORAL
  Filled 2018-03-16 (×2): qty 1

## 2018-03-16 NOTE — ED Triage Notes (Signed)
He tells me that he underwent a cystectomy with ille[place] conduit by Dr. [name] about a weak ago. He is here with c/o totally anorexia and increasing weakness with feeling of impending l.o.c. The urine in his urostomy bag is clear and amber. He reacts in disgust that we do not have a room to take him to on arrival. I apologize and tell him that we will begin appropriate testing. He still seems angry. He is cooperative. His wife is with him.

## 2018-03-16 NOTE — H&P (Signed)
History and Physical    Billy Fuller ERD:[id number] DOB: 11/29/1945 DOA: 03/16/2018  Referring MD/NP/PA: Nuala Alpha, PA-C PCP: [name], MD  Patient coming from: home via EMS  Chief Complaint: Weakness  I have personally briefly reviewed patient's old medical records in [place]   HPI: Billy Fuller is a 72 y.o. male with medical history significant of metastatic prostate cancer s/p prostatectomy, HLD, CVA, radiation cystitis s/p cystectomy with ileal conduit on 9/4 ; who presents with complaints of weakness and no appetite.  Since having the surgery patient reports that he is felt weak, but 3 days ago felt better enough to be discharged home.  He reportedly had one syncopal event prior to being discharged home.  However since being home he reports having worsening weakness and anorexia for which he is unable to ambulate out of the bed.  Any movement makes him significantly lightheaded as though he may pass out.  His wounds have been healing well denies any focal weakness, fever, nausea, vomiting, abdominal pain, flank pain, bleeding, diarrhea, or pain.  Associated symptoms include generalized malaise, fatigue, and over the last 3 to 4 months he reports a 30 pound weight loss.   ED Course: Upon admission to the emergency department patient was noted to be afebrile vital signs relatively within normal limits.  Labs revealed WBC 6.4, hemoglobin 8.3, platelets 435, BUN 25, and creatinine 0.81.  Urinalysis revealed moderate hemoglobin, large leukocytes, many bacteria, 21-50 RBCs, and 11-20 WBCs.  Stool guaiacs were negative. Patient was given 1 L of normal saline IV fluids.    TRH called to admit for dehydration.  Review of Systems  Constitutional: Positive for malaise/fatigue and weight loss. Negative for chills and fever.  HENT: Negative for congestion and nosebleeds.   Eyes: Negative for photophobia and discharge.  Respiratory: Negative for cough and shortness of breath.    Cardiovascular: Negative for chest pain and claudication.  Gastrointestinal: Negative for abdominal pain, blood in stool, nausea and vomiting.  Genitourinary: Negative for dysuria and hematuria.  Musculoskeletal: Negative for joint pain and myalgias.  Skin: Negative for itching and rash.  Neurological: Positive for dizziness, loss of consciousness and weakness. Negative for focal weakness.  Psychiatric/Behavioral: Negative for suicidal ideas. The patient is not nervous/anxious.     Past Medical History:  Diagnosis Date  . Cataract    eye implants  . ED (erectile dysfunction)   . Nephrolithiasis   . Prostate CA ([place]) 08/06/09   recurrent  . Stroke [facility])     Past Surgical History:  Procedure Laterality Date  . CYSTOSCOPY WITH FULGERATION N/A 09/04/2017   Procedure: [place] AND CLOT EVACUATION;  Surgeon: Alexis Frock, MD;  Location: [facility];  Service: Urology;  Laterality: N/A;  . CYSTOSCOPY WITH FULGERATION N/A 12/13/2017   Procedure: CYSTOSCOPY WITH FULGERATION OF BLEEDERS;  Surgeon: Irine Seal, MD;  Location: [facility];  Service: Urology;  Laterality: N/A;  . LYMPHADENECTOMY Bilateral 03/06/2018   Procedure: LYMPHADENECTOMY;  Surgeon: Alexis Frock, MD;  Location: [facility];  Service: Urology;  Laterality: Bilateral;  . ROBOT ASSISTED LAPAROSCOPIC COMPLETE CYSTECT ILEAL CONDUIT N/A 03/06/2018   Procedure: XI ROBOTIC ASSISTED LAPAROSCOPIC COMPLETE CYSTECT ILEAL CONDUIT;  Surgeon: Alexis Frock, MD;  Location: [facility];  Service: Urology;  Laterality: N/A;  . ROBOT ASSISTED LAPAROSCOPIC RADICAL PROSTATECTOMY     10/06/09     reports that he quit smoking about 38 years ago. He has never used smokeless tobacco. He reports that he drinks  alcohol. He reports that he does not use drugs.  No Known Allergies  Family History  Problem Relation Age of Onset  . Cancer Mother        breast  . Kidney failure Father   . Prostate cancer Father     Prior to Admission medications    Medication Sig Start Date End Date Taking? Authorizing Provider  atorvastatin (LIPITOR) 40 MG tablet Take 1 tablet (40 mg total) by mouth daily at 6 PM. 06/01/16  Yes Katheren Shams, DO  Multiple Vitamin (MULTIVITAMIN WITH MINERALS) TABS tablet Take 1 tablet by mouth daily.   Yes [provider]  oxyCODONE-acetaminophen (PERCOCET) 5-325 MG tablet Take 1 tablet by mouth every 4 (four) hours as needed for severe pain. 03/13/18 03/13/19  Alexis Frock, MD  oxyCODONE-acetaminophen (PERCOCET/ROXICET) 5-325 MG tablet Take 1-2 tablets by mouth every 6 (six) hours as needed for moderate pain or severe pain. Post-operatively. 03/13/18   Alexis Frock, MD  senna-docusate (SENOKOT-S) 8.6-50 MG tablet Take 1 tablet by mouth 2 (two) times daily. While taking strong pain meds to prevent constipation. 03/13/18   Alexis Frock, MD    Physical Exam:  Constitutional: Elderly male who appears to be fatigued Vitals:   03/16/18 2100 03/16/18 2130 03/16/18 2200 03/16/18 2230  BP: 119/63 117/60 123/66 122/62  Pulse:   82   Resp: (!) 22 (!) 23 19 18   Temp:      TempSrc:      SpO2: 97% 99% 99% 98%   Eyes: PERRL, lids and conjunctivae normal ENMT: Mucous membranes are dry. Posterior pharynx clear of any exudate or lesions.  Neck: normal, supple, no masses, no thyromegaly Respiratory: clear to auscultation bilaterally, no wheezing, no crackles. Normal respiratory effort. No accessory muscle use.  Cardiovascular: Regular rate and rhythm, no murmurs / rubs / gallops. No extremity edema. 2+ pedal pulses. No carotid bruits.  Abdomen: no tenderness, no masses palpated. No hepatosplenomegaly. Bowel sounds positive.  Ilial conduit in place draining yellow urine.  Healing surgical scars but no signs of erythema present Musculoskeletal: no clubbing / cyanosis. No joint deformity upper and lower extremities. Good ROM, no contractures. Normal muscle tone.  Skin: Mild pallor noted.  No rashes, lesions, ulcers. No  induration Neurologic: CN 2-12 grossly intact. Sensation intact, DTR normal. Strength 4/5 in all 4.  Psychiatric: Normal judgment and insight. Alert and oriented x 3.  Flat affect    Labs on Admission: I have personally reviewed following labs and imaging studies  CBC: Recent Labs  Lab 03/10/18 0512 03/11/18 0517 03/12/18 1307 03/16/18 1859  WBC 5.4 5.1  --  6.4  HGB 9.0* 9.5* 9.1* 8.3*  HCT 27.7* 29.3* 28.2* 25.9*  MCV 84.7 84.2  --  84.4  PLT 284 305  --  427*   Basic Metabolic Panel: Recent Labs  Lab 03/10/18 0512 03/11/18 0517 03/12/18 1307 03/16/18 1859  NA 139 139 137 138  K 4.0 4.1 4.5 4.0  CL 103 103 103 105  CO2 27 26 20* 22  GLUCOSE 140* 140* 264* 122*  BUN 6* 6* 11 25*  CREATININE 0.67 0.73 1.15 0.81  CALCIUM 8.2* 8.4* 8.2* 8.7*   GFR: Estimated Creatinine Clearance: 69.6 mL/min (by C-G formula based on SCr of 0.81 mg/dL). Liver Function Tests: No results for input(s): AST, ALT, ALKPHOS, BILITOT, PROT, ALBUMIN in the last 168 hours. No results for input(s): LIPASE, AMYLASE in the last 168 hours. No results for input(s): AMMONIA in the last 168 hours.  Coagulation Profile: No results for input(s): INR, PROTIME in the last 168 hours. Cardiac Enzymes: No results for input(s): CKTOTAL, CKMB, CKMBINDEX, TROPONINI in the last 168 hours. BNP (last 3 results) No results for input(s): PROBNP in the last 8760 hours. HbA1C: No results for input(s): HGBA1C in the last 72 hours. CBG: Recent Labs  Lab 03/12/18 1149  GLUCAP 255*   Lipid Profile: No results for input(s): CHOL, HDL, LDLCALC, TRIG, CHOLHDL, LDLDIRECT in the last 72 hours. Thyroid Function Tests: No results for input(s): TSH, T4TOTAL, FREET4, T3FREE, THYROIDAB in the last 72 hours. Anemia Panel: No results for input(s): VITAMINB12, FOLATE, FERRITIN, TIBC, IRON, RETICCTPCT in the last 72 hours. Urine analysis:    Component Value Date/Time   COLORURINE YELLOW 03/16/2018 2103   APPEARANCEUR  CLEAR 03/16/2018 2103   LABSPEC 1.011 03/16/2018 2103   PHURINE 7.0 03/16/2018 2103   GLUCOSEU NEGATIVE 03/16/2018 2103   HGBUR MODERATE (A) 03/16/2018 2103   BILIRUBINUR NEGATIVE 03/16/2018 2103   [place] NEGATIVE 03/16/2018 2103   PROTEINUR 100 (A) 03/16/2018 2103   NITRITE NEGATIVE 03/16/2018 2103   LEUKOCYTESUR LARGE (A) 03/16/2018 2103   Sepsis Labs: Recent Results (from the past 240 hour(s))  Culture, blood (routine x 2)     Status: None (Preliminary result)   Collection Time: 03/12/18  1:07 PM  Result Value Ref Range Status   Specimen Description   Final    BLOOD RIGHT ARM Performed at [facility], [place] [address] [place], [place] [address]    Special Requests   Final    BOTTLES DRAWN AEROBIC ONLY Blood Culture results may not be optimal due to an inadequate volume of blood received in culture bottles Performed at [place] [address], [place], [place] [address]    Culture   Final    NO GROWTH 4 DAYS Performed at [facility], [place] [address] [place], [place] [address]    Report Status PENDING  Incomplete  Culture, blood (routine x 2)     Status: None (Preliminary result)   Collection Time: 03/12/18  1:15 PM  Result Value Ref Range Status   Specimen Description   Final    BLOOD RIGHT HAND Performed at [facility] [address] [place], [place] [address]    Special Requests   Final    BOTTLES DRAWN AEROBIC ONLY Blood Culture results may not be optimal due to an inadequate volume of blood received in culture bottles Performed at [place] [address], [place], [place] [address]    Culture   Final    NO GROWTH 4 DAYS Performed at [facility], [place] [address], [place], [place] [address]    Report Status PENDING  Incomplete     Radiological Exams on Admission: No results found.  EKG: Independently reviewed.  Sinus tachycardia 104  bpm  Assessment/Plan Weakness and dehydration: Acute.  Patient presents with reports of decreased p.o. intake found to have elevated BUN to creatinine ratio to suggest dehydration.  Symptoms may be multifactorial given hemoglobin 8.3 on admission previously noted to be 9 at discharge. - Admit to a MedSurg bed - Normal saline IV fluids at 100 mL/h - Physical therapy to eval and treat in a.m. for weakness  Urinary tract infection: Acute.  Patient found to have many bacteria with large leukocytes on admission. - Check urine culture - Rocephin IV  Acute blood loss anemia: On admission during last hospitalization hemoglobin was  noted to be 11.4, following surgery prior to discharge hemoglobin noted to be 9.1.  On admission hemoglobin 8.3 with stool guaiacs negative.  Patient was typed and screened for possible need of blood products. - Consider need of transfusion if hemoglobin continues to drop we will need to look for source of bleeding.  Metastatic prostate cancer: Patient had prostatectomy 2011 for Stage 3 disease, then salvage radiation, and Abiraterone trial at [facility] until 2015. PSA rise to 30 in 2018 and resumed androgen deprivation.   -Continue outpatient follow-up with Dr. Alen Blew  Radiation cystitis: He had cystectomy with ileal conduit on 9/4 by Dr. [name].  Denies any complaints of pain at this time.  History of CVA: Patient reports no residual weakness.  DVT prophylaxis: SCDs Code Status: Full Family Communication: No family present at bedside Disposition Plan: To be determined Consults called: None Admission status: Observation  Norval Morton MD [facility] Pager [phone]   If 7PM-7AM, please contact night-coverage [email or web address] Password [facility]  03/16/2018, 11:02 PM

## 2018-03-16 NOTE — ED Notes (Signed)
Bed: WA18 Expected date:  Expected time:  Means of arrival:  Comments: Triage 4 

## 2018-03-16 NOTE — ED Provider Notes (Addendum)
[facility] DEPT Provider Note   CSN: [id number] Arrival date & time: 03/16/18  1756     History   Chief Complaint Chief Complaint  Patient presents with  . Weakness    HPI [name] is a 72 y.o. male presenting for generalized weakness and decreased appetite following cystectomy with ileoconduit performed 1 week ago.  Patient and family report that in the hospital patient was feeling weak, with one syncopal episode following his surgery.  Patient states that he began to feel better and was discharged 4 days ago.  Since discharge patient has been feeling weaker, is unable to get out of bed or ambulate independently.  Patient endorses anorexia, states that he will chew his food however his food tastes like "rubber "and he cannot swallow the food.  Patient states that he is able to drink water without difficulty/vomiting.  Patient denies fevers, consultations with surgical site.  Site is producing clear/amber urine.  Patient denies abdominal pain or fevers, denies pain of any kind states that he is only feeling generalized weakness.  Patient states that when he stands he feels that he will fall due to weakness.  Presenting with his wife who is concerned that she can no longer take care of him at the house and that he is getting weaker and slipped stronger after the surgery.  Patient denies syncope since discharge from hospital, states that he syncopized once with nursing staff present no injury following standing up quickly.  HPI  Past Medical History:  Diagnosis Date  . Cataract    eye implants  . ED (erectile dysfunction)   . Nephrolithiasis   . Prostate CA (Billy Fuller) 08/06/09   recurrent  . Stroke [facility])     Patient Active Problem List   Diagnosis Date Noted  . Protein-calorie malnutrition, severe 03/09/2018  . S/P ileal conduit ([place]) 03/06/2018  . Chronic radiation cystitis 03/06/2018  . Gross hematuria 12/11/2017  . Radiation cystitis  09/04/2017  . Acute CVA (cerebrovascular accident) (Billy Fuller) 05/31/2016  . Cerebrovascular accident (CVA) (Billy Fuller)   . Left-sided weakness 05/30/2016  . TIA (transient ischemic attack) 05/30/2016  . Impacted cerumen of right ear 05/01/2016  . Prostate cancer (Billy Fuller) 05/16/2011  . Prostate CA (Billy Fuller) 08/06/2009    Past Surgical History:  Procedure Laterality Date  . CYSTOSCOPY WITH FULGERATION N/A 09/04/2017   Procedure: [place] AND CLOT EVACUATION;  Surgeon: Alexis Frock, MD;  [facility];  Service: Urology;  Laterality: N/A;  . CYSTOSCOPY WITH FULGERATION N/A 12/13/2017   Procedure: CYSTOSCOPY WITH FULGERATION OF BLEEDERS;  Surgeon: Irine Seal, MD;  Location: [facility];  Service: Urology;  Laterality: N/A;  . LYMPHADENECTOMY Bilateral 03/06/2018   Procedure: LYMPHADENECTOMY;  Surgeon: Alexis Frock, MD;  [facility]: [facility];  Service: Urology;  Laterality: Bilateral;  . ROBOT ASSISTED LAPAROSCOPIC COMPLETE CYSTECT ILEAL CONDUIT N/A 03/06/2018   Procedure: XI ROBOTIC ASSISTED LAPAROSCOPIC COMPLETE CYSTECT ILEAL CONDUIT;  Surgeon: Alexis Frock, MD;  [facility];  Service: Urology;  Laterality: N/A;  . ROBOT ASSISTED LAPAROSCOPIC RADICAL PROSTATECTOMY     10/06/09        Home Medications    Prior to Admission medications   Medication Sig Start Date End Date Taking? Authorizing Provider  atorvastatin (LIPITOR) 40 MG tablet Take 1 tablet (40 mg total) by mouth daily at 6 PM. 06/01/16   Katheren Shams, DO  Multiple Vitamin (MULTIVITAMIN WITH MINERALS) TABS tablet Take 1 tablet by mouth daily.    [provider]  oxyCODONE-acetaminophen (PERCOCET) 5-325 MG tablet Take 1 tablet by mouth every 4 (four) hours as needed for severe pain. 03/13/18 03/13/19  Alexis Frock, MD  oxyCODONE-acetaminophen (PERCOCET/ROXICET) 5-325 MG tablet Take 1-2 tablets by mouth every 6 (six) hours as needed for moderate pain or severe pain. Post-operatively. 03/13/18   Alexis Frock, MD    senna-docusate (SENOKOT-S) 8.6-50 MG tablet Take 1 tablet by mouth 2 (two) times daily. While taking strong pain meds to prevent constipation. 03/13/18   Alexis Frock, MD    Family History Family History  Problem Relation Age of Onset  . Cancer Mother        breast  . Kidney failure Father   . Prostate cancer Father     Social History Social History   Tobacco Use  . Smoking status: Former Smoker    Last attempt to quit: 05/31/1979    Years since quitting: 38.8  . Smokeless tobacco: Never Used  Substance Use Topics  . Alcohol use: Yes    Comment: 4 alcoholic beverages/week  . Drug use: No     Allergies   Patient has no known allergies.   Review of Systems Review of Systems  Constitutional: Positive for appetite change. Negative for chills and fever.  Respiratory: Negative.  Negative for shortness of breath.   Cardiovascular: Negative.  Negative for chest pain and leg swelling.  Gastrointestinal: Negative.  Negative for abdominal pain, blood in stool, diarrhea, nausea and vomiting.  Genitourinary: Negative.  Negative for dysuria, flank pain, hematuria, scrotal swelling and testicular pain.  Neurological: Positive for weakness and light-headedness. Negative for dizziness and headaches.  All other systems reviewed and are negative.    Physical Exam Updated Vital Signs BP 119/63   Pulse 100   Temp 97.6 F (36.4 C) (Oral)   Resp (!) 22   SpO2 97%   Physical Exam  Constitutional: He is oriented to person, place, and time. He appears well-developed and well-nourished. No distress.  HENT:  Head: Normocephalic and atraumatic.  Right Ear: External ear normal.  Left Ear: External ear normal.  Nose: Nose normal.  Mouth/Throat: Uvula is midline and oropharynx is clear and moist. Mucous membranes are pale.  Eyes: Pupils are equal, round, and reactive to light. EOM are normal.  Neck: Trachea normal and normal range of motion. No tracheal deviation present.   Cardiovascular: Normal rate, regular rhythm and intact distal pulses.  Pulmonary/Chest: Effort normal and breath sounds normal. No respiratory distress.  Abdominal: Soft. Bowel sounds are normal. There is no tenderness. There is no rigidity, no rebound, no guarding, no tenderness at McBurney's point and negative Murphy's sign.    Multiple well-healing surgical scars.  No signs of infection or dehiscence.  Musculoskeletal: Normal range of motion.  Neurological: He is alert and oriented to person, place, and time. No cranial nerve deficit or sensory deficit. GCS eye subscore is 4. GCS verbal subscore is 5. GCS motor subscore is 6.  Mental Status: Alert, oriented, thought content appropriate, able to give a coherent history. Speech fluent without evidence of aphasia. Able to follow 2 step commands without difficulty. Cranial Nerves: II: Peripheral visual fields grossly normal, pupils equal, round, reactive to light III,IV, VI: ptosis not present, extra-ocular motions intact bilaterally V,VII: smile symmetric, eyebrows raise symmetric, facial light touch sensation equal VIII: hearing grossly normal to voice X: uvula elevates symmetrically XI: bilateral shoulder shrug symmetric and strong XII: midline tongue extension without fassiculations Motor: Normal tone. 5/5 strength in upper and  lower extremities bilaterally including strong and equal grip strength and dorsiflexion/plantar flexion Sensory: Sensation intact to light touch in all extremities. CV: distal pulses palpable throughout   Skin: Skin is warm and dry. Capillary refill takes less than 2 seconds. There is pallor.  Psychiatric: He has a normal mood and affect. His behavior is normal.   Rectal exam chaperoned by nurse tech Danae Chen.  No gross blood on exam, normal rectal tone, no palpable hemorrhoids or masses. ED Treatments / Results  Labs (all labs ordered are listed, but only abnormal results are displayed) Labs Reviewed   BASIC METABOLIC PANEL - Abnormal; Notable for the following components:      Result Value   Glucose, Bld 122 (*)    BUN 25 (*)    Calcium 8.7 (*)    All other components within normal limits  CBC - Abnormal; Notable for the following components:   RBC 3.07 (*)    Hemoglobin 8.3 (*)    HCT 25.9 (*)    RDW 15.9 (*)    Platelets 435 (*)    All other components within normal limits  URINALYSIS, ROUTINE W REFLEX MICROSCOPIC - Abnormal; Notable for the following components:   Hgb urine dipstick MODERATE (*)    Protein, ur 100 (*)    Leukocytes, UA LARGE (*)    Bacteria, UA MANY (*)    All other components within normal limits  POC OCCULT BLOOD, ED    EKG None  Radiology No results found.  Procedures Procedures (including critical care time)  Medications Ordered in ED Medications  sodium chloride 0.9 % bolus 1,000 mL (1,000 mLs Intravenous New Bag/Given 03/16/18 2114)     Initial Impression / Assessment and Plan / ED Course  I have reviewed the triage vital signs and the nursing notes.  Pertinent labs & imaging results that were available during my care of the patient were reviewed by me and considered in my medical decision making (see chart for details).  Clinical Course as of Mar 16 2338  Sat Mar 16, 2018  2136 Rectal exam chaperoned by Danae Chen NT.   [BM]  2301 Consult called to Dr. Tamala Julian who is coming to see patient in emergency department to admit.   [BM]    Clinical Course User Index [BM] Deliah Boston, PA-C   Patient presenting wi increasing generalized weakness.  History of recent abdominal surgery, cystectomy with ileal conduit.  Surgical site appears well-healing and functioning. No complaints of pain or history of fever.  BMP shows increased BUN of 25 POC occult blood negative Urinalysis shows leukocytes and bacteria, this is expected due to ileal conduit.  Patient is afebrile, no CVA tenderness. CBC shows anemia, decreased from 9.1 to 8.3 from 4 days  ago. Patient unable to stand for orthostatic vital signs due to weakness and lightheadedness.  Patient's case discussed with Dr. Ashok Cordia who agrees with work-up at this time, recommends admission due to increased weakness and symptomatic anemia.  Consult called to Dr. Tamala Julian for admission for generalized weakness and anemia.  Dr. Tamala Julian has admitted patient to his service.  Patient and family have been updated and are agreeable care plan.   Note: Portions of this report may have been transcribed using voice recognition software. Every effort was made to ensure accuracy; however, inadvertent computerized transcription errors may still be present.   Final Clinical Impressions(s) / ED Diagnoses   Final diagnoses:  Weakness  Symptomatic anemia    ED Discharge Orders  None       Gari Crown 03/16/18 2340    Deliah Boston, PA-C 03/17/18 Berniece Salines    Lajean Saver, MD 03/17/18 [phone]

## 2018-03-17 ENCOUNTER — Encounter (HOSPITAL_COMMUNITY): Payer: Self-pay

## 2018-03-17 ENCOUNTER — Other Ambulatory Visit: Payer: Self-pay

## 2018-03-17 DIAGNOSIS — Z906 Acquired absence of other parts of urinary tract: Secondary | ICD-10-CM | POA: Diagnosis not present

## 2018-03-17 DIAGNOSIS — Z936 Other artificial openings of urinary tract status: Secondary | ICD-10-CM | POA: Diagnosis not present

## 2018-03-17 DIAGNOSIS — R627 Adult failure to thrive: Secondary | ICD-10-CM | POA: Diagnosis present

## 2018-03-17 DIAGNOSIS — Z79899 Other long term (current) drug therapy: Secondary | ICD-10-CM | POA: Diagnosis not present

## 2018-03-17 DIAGNOSIS — Z9842 Cataract extraction status, left eye: Secondary | ICD-10-CM | POA: Diagnosis not present

## 2018-03-17 DIAGNOSIS — Z8546 Personal history of malignant neoplasm of prostate: Secondary | ICD-10-CM | POA: Diagnosis not present

## 2018-03-17 DIAGNOSIS — Z961 Presence of intraocular lens: Secondary | ICD-10-CM | POA: Diagnosis present

## 2018-03-17 DIAGNOSIS — R531 Weakness: Secondary | ICD-10-CM | POA: Diagnosis present

## 2018-03-17 DIAGNOSIS — Z87891 Personal history of nicotine dependence: Secondary | ICD-10-CM | POA: Diagnosis not present

## 2018-03-17 DIAGNOSIS — Z9079 Acquired absence of other genital organ(s): Secondary | ICD-10-CM | POA: Diagnosis not present

## 2018-03-17 DIAGNOSIS — Z8673 Personal history of transient ischemic attack (TIA), and cerebral infarction without residual deficits: Secondary | ICD-10-CM

## 2018-03-17 DIAGNOSIS — D62 Acute posthemorrhagic anemia: Secondary | ICD-10-CM | POA: Diagnosis present

## 2018-03-17 DIAGNOSIS — Z8042 Family history of malignant neoplasm of prostate: Secondary | ICD-10-CM | POA: Diagnosis not present

## 2018-03-17 DIAGNOSIS — Z9841 Cataract extraction status, right eye: Secondary | ICD-10-CM | POA: Diagnosis not present

## 2018-03-17 DIAGNOSIS — N390 Urinary tract infection, site not specified: Secondary | ICD-10-CM | POA: Diagnosis present

## 2018-03-17 DIAGNOSIS — E860 Dehydration: Secondary | ICD-10-CM | POA: Diagnosis present

## 2018-03-17 DIAGNOSIS — Z923 Personal history of irradiation: Secondary | ICD-10-CM | POA: Diagnosis not present

## 2018-03-17 DIAGNOSIS — Z6824 Body mass index (BMI) 24.0-24.9, adult: Secondary | ICD-10-CM | POA: Diagnosis not present

## 2018-03-17 DIAGNOSIS — E43 Unspecified severe protein-calorie malnutrition: Secondary | ICD-10-CM | POA: Diagnosis present

## 2018-03-17 LAB — BASIC METABOLIC PANEL
ANION GAP: 8 (ref 5–15)
BUN: 17 mg/dL (ref 8–23)
CALCIUM: 8 mg/dL — AB (ref 8.9–10.3)
CO2: 23 mmol/L (ref 22–32)
Chloride: 108 mmol/L (ref 98–111)
Creatinine, Ser: 0.7 mg/dL (ref 0.61–1.24)
Glucose, Bld: 102 mg/dL — ABNORMAL HIGH (ref 70–99)
Potassium: 3.6 mmol/L (ref 3.5–5.1)
Sodium: 139 mmol/L (ref 135–145)

## 2018-03-17 LAB — CULTURE, BLOOD (ROUTINE X 2)
CULTURE: NO GROWTH
CULTURE: NO GROWTH

## 2018-03-17 LAB — CBC
HCT: 23.1 % — ABNORMAL LOW (ref 39.0–52.0)
Hemoglobin: 7.4 g/dL — ABNORMAL LOW (ref 13.0–17.0)
MCH: 27.6 pg (ref 26.0–34.0)
MCHC: 32 g/dL (ref 30.0–36.0)
MCV: 86.2 fL (ref 78.0–100.0)
PLATELETS: 285 10*3/uL (ref 150–400)
RBC: 2.68 MIL/uL — ABNORMAL LOW (ref 4.22–5.81)
RDW: 16 % — ABNORMAL HIGH (ref 11.5–15.5)
WBC: 5.2 10*3/uL (ref 4.0–10.5)

## 2018-03-17 LAB — PREPARE RBC (CROSSMATCH)

## 2018-03-17 LAB — HEMOGLOBIN AND HEMATOCRIT, BLOOD
HCT: 25.8 % — ABNORMAL LOW (ref 39.0–52.0)
Hemoglobin: 8.4 g/dL — ABNORMAL LOW (ref 13.0–17.0)

## 2018-03-17 MED ORDER — ENSURE ENLIVE PO LIQD: 237.0000 mL | Freq: Two times a day (BID) | ORAL | Status: DC

## 2018-03-17 MED ORDER — SODIUM CHLORIDE 0.9 % IV SOLN
2.0000 g | INTRAVENOUS | Status: DC
  Administered 2018-03-17 – 2018-03-18 (×2): 2 g via INTRAVENOUS
  Filled 2018-03-17: qty 20
  Filled 2018-03-17 (×2): qty 2

## 2018-03-17 MED ORDER — ACETAMINOPHEN 325 MG PO TABS: 650.0000 mg | ORAL_TABLET | Freq: Four times a day (QID) | ORAL | Status: DC | PRN

## 2018-03-17 MED ORDER — BOOST / RESOURCE BREEZE PO LIQD CUSTOM
1.0000 | Freq: Three times a day (TID) | ORAL | Status: DC
  Administered 2018-03-17: 1 via ORAL

## 2018-03-17 MED ORDER — SODIUM CHLORIDE 0.9 % IV SOLN: 2.0000 g | INTRAVENOUS | Status: DC

## 2018-03-17 MED ORDER — SODIUM CHLORIDE 0.9% IV SOLUTION: Freq: Once | INTRAVENOUS | Status: DC

## 2018-03-17 MED ORDER — MEGESTROL ACETATE 400 MG/10ML PO SUSP
400.0000 mg | Freq: Two times a day (BID) | ORAL | Status: DC
  Administered 2018-03-17 – 2018-03-19 (×3): 400 mg via ORAL
  Filled 2018-03-17 (×4): qty 10

## 2018-03-17 MED ORDER — SODIUM CHLORIDE 0.9 % IV SOLN
2.0000 g | Freq: Once | INTRAVENOUS | Status: AC
  Administered 2018-03-17: 2 g via INTRAVENOUS
  Filled 2018-03-17: qty 20

## 2018-03-17 MED ORDER — ONDANSETRON HCL 4 MG/2ML IJ SOLN: 4.0000 mg | Freq: Four times a day (QID) | INTRAMUSCULAR | Status: DC | PRN

## 2018-03-17 MED ORDER — POLYETHYLENE GLYCOL 3350 17 G PO PACK
17.0000 g | PACK | Freq: Every day | ORAL | Status: DC
  Administered 2018-03-17: 17 g via ORAL
  Filled 2018-03-17: qty 1

## 2018-03-17 MED ORDER — ONDANSETRON HCL 4 MG PO TABS: 4.0000 mg | ORAL_TABLET | Freq: Four times a day (QID) | ORAL | Status: DC | PRN

## 2018-03-17 MED ORDER — ALBUTEROL SULFATE (2.5 MG/3ML) 0.083% IN NEBU: 2.5000 mg | INHALATION_SOLUTION | Freq: Four times a day (QID) | RESPIRATORY_TRACT | Status: DC | PRN

## 2018-03-17 MED ORDER — ACETAMINOPHEN 650 MG RE SUPP: 650.0000 mg | Freq: Four times a day (QID) | RECTAL | Status: DC | PRN

## 2018-03-17 NOTE — Plan of Care (Signed)
Up to chair today x 2 hours with 1 asst. Verbalize understanding.

## 2018-03-17 NOTE — Progress Notes (Signed)
Assumed care of patient at 1530. Agree with previous Nurse assessment. Pt stable. Orders for blood transfusion, consent obtain and will administer 1 unit PRBC.  Barbee Shropshire. Brigitte Pulse, RN

## 2018-03-17 NOTE — Consult Note (Signed)
ID: 72 year old male status post cystectomy for refractory radiation cystitis on 03/06/2018.  He was readmitted with dehydration, weakness, and malnutrition.  History of present illness: Mr. Billy Fuller is a 72 year old male with a history of metastatic prostate cancer treated with radiation.  He subsequently developed radiation cystitis refractory to conservative measures.  On 03/06/2018 Dr. [name] performed a cystectomy with ileal conduit treatment for his radiation cystitis.  The patient had a relatively uneventful hospital course was discharged on postoperative day #6.  While at home the patient states that he progressively got weaker, at this point is no longer able to stand on his own.  He was unable to eat and or drink.  He presented to the hospital with weakness, instability, and failure to thrive.  He was noted to be dehydrated without any other evidence of infection or abnormality.  His hemoglobin was lower than at the time of his discharge.  He denies any fevers.  He has had no significant nausea or pain.  His last bowel movement was 3 days ago.  Review of systems: A 12 point comprehensive review of systems was obtained and is negative unless otherwise stated in the history of present illness.  Patient Active Problem List   Diagnosis Date Noted  . Acute lower UTI 03/17/2018  . History of CVA (cerebrovascular accident) 03/17/2018  . Weakness 03/17/2018  . Acute blood loss anemia 03/17/2018  . Dehydration 03/16/2018  . Protein-calorie malnutrition, severe 03/09/2018  . S/P ileal conduit ([place]) 03/06/2018  . Chronic radiation cystitis 03/06/2018  . Gross hematuria 12/11/2017  . Radiation cystitis 09/04/2017  . Acute CVA (cerebrovascular accident) ([place]) 05/31/2016  . Cerebrovascular accident (CVA) ([place])   . Left-sided weakness 05/30/2016  . TIA (transient ischemic attack) 05/30/2016  . Impacted cerumen of right ear 05/01/2016  . Prostate cancer ([place]) 05/16/2011  . Prostate CA ([place]) 08/06/2009     No current facility-administered medications on file prior to encounter.    Current Outpatient Medications on File Prior to Encounter  Medication Sig Dispense Refill  . atorvastatin (LIPITOR) 40 MG tablet Take 1 tablet (40 mg total) by mouth daily at 6 PM. 30 tablet 0  . Multiple Vitamin (MULTIVITAMIN WITH MINERALS) TABS tablet Take 1 tablet by mouth daily.    Marland Kitchen oxyCODONE-acetaminophen (PERCOCET) 5-325 MG tablet Take 1 tablet by mouth every 4 (four) hours as needed for severe pain. 20 tablet 0  . oxyCODONE-acetaminophen (PERCOCET/ROXICET) 5-325 MG tablet Take 1-2 tablets by mouth every 6 (six) hours as needed for moderate pain or severe pain. Post-operatively. 20 tablet 0  . senna-docusate (SENOKOT-S) 8.6-50 MG tablet Take 1 tablet by mouth 2 (two) times daily. While taking strong pain meds to prevent constipation. 20 tablet 0    Past Medical History:  Diagnosis Date  . Cataract    eye implants  . ED (erectile dysfunction)   . Nephrolithiasis   . Prostate CA ([place]) 08/06/09   recurrent  . Stroke [facility])     Past Surgical History:  Procedure Laterality Date  . CYSTOSCOPY WITH FULGERATION N/A 09/04/2017   Procedure: [place] AND CLOT EVACUATION;  Surgeon: Alexis Frock, MD;  Location: [facility];  Service: Urology;  Laterality: N/A;  . CYSTOSCOPY WITH FULGERATION N/A 12/13/2017   Procedure: CYSTOSCOPY WITH FULGERATION OF BLEEDERS;  Surgeon: Irine Seal, MD;  Location: [facility];  Service: Urology;  Laterality: N/A;  . LYMPHADENECTOMY Bilateral 03/06/2018   Procedure: LYMPHADENECTOMY;  Surgeon: Alexis Frock, MD;  Location: [facility];  Service: Urology;  Laterality: Bilateral;  . ROBOT ASSISTED LAPAROSCOPIC COMPLETE CYSTECT ILEAL CONDUIT N/A 03/06/2018   Procedure: XI ROBOTIC ASSISTED LAPAROSCOPIC COMPLETE CYSTECT ILEAL CONDUIT;  Surgeon: Alexis Frock, MD;  Location: [facility];  Service: Urology;  Laterality: N/A;  . ROBOT ASSISTED LAPAROSCOPIC RADICAL PROSTATECTOMY     10/06/09     Social History   Tobacco Use  . Smoking status: Former Smoker    Last attempt to quit: 05/31/1979    Years since quitting: 38.8  . Smokeless tobacco: Never Used  Substance Use Topics  . Alcohol use: Yes    Comment: 4 alcoholic beverages/week  . Drug use: No    Family History  Problem Relation Age of Onset  . Cancer Mother        breast  . Kidney failure Father   . Prostate cancer Father     PE: Vitals:   03/17/18 0100 03/17/18 0140 03/17/18 0500 03/17/18 0500  BP: 102/64 134/68  112/67  Pulse: 77 78  70  Resp: 13 16  20   Temp:  98.2 F (36.8 C)  98.2 F (36.8 C)  TempSrc:  Oral  Oral  SpO2: 99% 98%  99%  Weight:   66.3 kg    Patient appears to be in no acute distress  patient is alert and oriented x3 Atraumatic normocephalic head No cervical or supraclavicular lymphadenopathy appreciated No increased work of breathing, no audible wheezes/rhonchi Regular sinus rhythm/rate Abdomen is soft, nontender, nondistended, no CVA or suprapubic tenderness Incisions are clean/dry/intact without evidence of erythema or discharge. Patient's urostomy is pink and viable with clear yellow urine draining. Lower extremities are symmetric without appreciable edema Grossly neurologically intact No identifiable skin lesions  Recent Labs    03/16/18 1859 03/17/18 0429  WBC 6.4 5.2  HGB 8.3* 7.4*  HCT 25.9* 23.1*   Recent Labs    03/16/18 1859 03/17/18 0429  NA 138 139  K 4.0 3.6  CL 105 108  CO2 22 23  GLUCOSE 122* 102*  BUN 25* 17  CREATININE 0.81 0.70  CALCIUM 8.7* 8.0*   No results for input(s): LABPT, INR in the last 72 hours. No results for input(s): LABURIN in the last 72 hours. Results for orders placed or performed during the hospital encounter of 03/05/18  Surgical pcr screen     Status: None   Collection Time: 03/05/18 12:12 PM  Result Value Ref Range Status   MRSA, PCR NEGATIVE NEGATIVE Final   Staphylococcus aureus NEGATIVE NEGATIVE Final     Comment: (NOTE) The Xpert SA Assay (FDA approved for NASAL specimens in patients 48 years of age and older), is one component of a comprehensive surveillance program. It is not intended to diagnose infection nor to guide or monitor treatment. Performed at [facility], [place] [address] [place], [place] [address]   Culture, blood (routine x 2)     Status: None (Preliminary result)   Collection Time: 03/12/18  1:07 PM  Result Value Ref Range Status   Specimen Description   Final    BLOOD RIGHT ARM Performed at [facility] [address] [place], [place] [address]    Special Requests   Final    BOTTLES DRAWN AEROBIC ONLY Blood Culture results may not be optimal due to an inadequate volume of blood received in culture bottles Performed at [facility] [address] [place], [place] [address]    Culture   Final    NO GROWTH 4 DAYS Performed at  [facility], [place] [address] [place], [place] [address]    Report Status PENDING  Incomplete  Culture, blood (routine x 2)     Status: None (Preliminary result)   Collection Time: 03/12/18  1:15 PM  Result Value Ref Range Status   Specimen Description   Final    BLOOD RIGHT HAND Performed at [facility] [address] [place], [place] [address]    Special Requests   Final    BOTTLES DRAWN AEROBIC ONLY Blood Culture results may not be optimal due to an inadequate volume of blood received in culture bottles Performed at [facility] [address], [place], [place] [address]    Culture   Final    NO GROWTH 4 DAYS Performed at [facility], [place] [address], [place], [place] [address]    Report Status PENDING  Incomplete    Imaging: none  Imp: 72 year old male status post cystectomy for refractory radiation cystitis is treatment for his prostate cancer who was readmitted for failure to thrive, dehydration, malnutrition and  anemia.  Recommendations: The patient needs IV hydration, nutrition consult and physical therapy to help curb his deconditioning and weakness.  He may need a skilled nursing facility at discharge.  Otherwise, I do not see any indication that he has infection or anything else going on that may be concerning or complicating his presentation.  We will continue to follow along, Dr. Collene Mares he will return tomorrow morning.   Louis Meckel W

## 2018-03-17 NOTE — Progress Notes (Addendum)
PROGRESS NOTE    Billy Fuller  TDH:[id number] DOB: [birth year] DOA: 03/16/2018 PCP: [name], MD   Brief Narrative: Patient is a 71 male with past medical history of metastatic prostate cancer status post prostatectomy, CVA, radiation cystitis status post cystectomy with ileal conduit on 9/4 who presented from home with complaints of generalized weakness, inability to ambulate and loss of appetite/weight.  Since his last surgery, patient reported to have been gradually weak.  On admission, urinalysis was also suggestive of UTI.  He was admitted for management of urinary tract infection.  Assessment & Plan:   Principal Problem:   Dehydration Active Problems:   Prostate cancer ([place])   S/P ileal conduit ([facility])   Acute lower UTI   History of CVA (cerebrovascular accident)   Weakness   Acute blood loss anemia  Suspected UTI: Urinalysis was positive urine tract infection.  Urine culture has been sent which we will follow.  He started on ceftriaxone.  Currently patient is hemodynamically stable.  He is not septic.  Weakness: Complains of gradual weakness.  Since surgery, he has been unable to even ambulate.  Wife was tearful saying that she is not able to take care of him at home.  Will request for physical therapy evaluation. Weakness could be secondary to poor oral intake, dehydration.  Continue gentle IV fluids.  Poor oral intake/loss of appetite: Reports severe loss of appetite.  Complains that the food feels like  Rubber.  Will start on Megace.  Nutrition consultation requested.  Reported to have lost 30 pounds since last 4 to 6 months.  Anemia: Most likely this episode with recent surgery.  His last hemoglobin at the time of discharge was 9.1.  Hemoglobin of 7.4 today.  FOBT negative. He has been transfused with a pack of RBC.  Will check CBC tomorrow.  Metastatic prostate cancer:Patient had prostatectomy 2011 for Stage 3 disease, then salvage radiation, and Abiraterone trial at  [facility] until 2015. PSA rise to 30 in 2018 and resumed androgen deprivation.   Continue outpatient follow-up with Dr. Alen Blew.  Radiation cystitis: He had cystectomy with ileal conduit on 9/4 by Dr. [name].  Urology following.  History of CVA: No residual weakness.  Continue current meds    DVT prophylaxis: SCD Code Status: Full code Family Communication: Wife present at the bedside Disposition Plan: Depending upon PT evaluation.   Consultants: Urology  Procedures: None  Antimicrobials: Antibiotics  Subjective: Patient seen and examined the bedside this morning.  Remains comfortable.  Hemodynamically stable.  No specific complaints at this point.  Generalized complaints of weakness and loss of appetite.  Objective: Vitals:   03/17/18 0100 03/17/18 0140 03/17/18 0500 03/17/18 0500  BP: 102/64 134/68  112/67  Pulse: 77 78  70  Resp: 13 16  20   Temp:  98.2 F (36.8 C)  98.2 F (36.8 C)  TempSrc:  Oral  Oral  SpO2: 99% 98%  99%  Weight:   66.3 kg     Intake/Output Summary (Last 24 hours) at 03/17/2018 1324 Last data filed at 03/17/2018 1053 Gross per 24 hour  Intake 1463 ml  Output 1325 ml  Net 138 ml   Filed Weights   03/17/18 0500  Weight: 66.3 kg    Examination:  General exam: Appears calm and comfortable ,Not in distress,average built HEENT:PERRL,Oral mucosa moist, Ear/Nose normal on gross exam Respiratory system: Bilateral equal air entry, normal vesicular breath sounds, no wheezes or crackles  Cardiovascular system: S1 & S2 heard, RRR. No  JVD, murmurs, rubs, gallops or clicks. No pedal edema. Gastrointestinal system: Abdomen is nondistended, soft and nontender. No organomegaly or masses felt. Normal bowel sounds heard. Clean surgical scars, urostomy Central nervous system: Alert and oriented. No focal neurological deficits. Extremities: No edema, no clubbing ,no cyanosis, distal peripheral pulses palpable. Skin: No rashes, lesions or ulcers,no icterus ,no  pallor MSK: Normal muscle bulk,tone ,power Psychiatry: Judgement and insight appear normal. Mood & affect appropriate.     Data Reviewed: I have personally reviewed following labs and imaging studies  CBC: Recent Labs  Lab 03/11/18 0517 03/12/18 1307 03/16/18 1859 03/17/18 0429  WBC 5.1  --  6.4 5.2  HGB 9.5* 9.1* 8.3* 7.4*  HCT 29.3* 28.2* 25.9* 23.1*  MCV 84.2  --  84.4 86.2  PLT 305  --  435* 967   Basic Metabolic Panel: Recent Labs  Lab 03/11/18 0517 03/12/18 1307 03/16/18 1859 03/17/18 0429  NA 139 137 138 139  K 4.1 4.5 4.0 3.6  CL 103 103 105 108  CO2 26 20* 22 23  GLUCOSE 140* 264* 122* 102*  BUN 6* 11 25* 17  CREATININE 0.73 1.15 0.81 0.70  CALCIUM 8.4* 8.2* 8.7* 8.0*   GFR: Estimated Creatinine Clearance: 70.5 mL/min (by C-G formula based on SCr of 0.7 mg/dL). Liver Function Tests: No results for input(s): AST, ALT, ALKPHOS, BILITOT, PROT, ALBUMIN in the last 168 hours. No results for input(s): LIPASE, AMYLASE in the last 168 hours. No results for input(s): AMMONIA in the last 168 hours. Coagulation Profile: No results for input(s): INR, PROTIME in the last 168 hours. Cardiac Enzymes: No results for input(s): CKTOTAL, CKMB, CKMBINDEX, TROPONINI in the last 168 hours. BNP (last 3 results) No results for input(s): PROBNP in the last 8760 hours. HbA1C: No results for input(s): HGBA1C in the last 72 hours. CBG: Recent Labs  Lab 03/12/18 1149  GLUCAP 255*   Lipid Profile: No results for input(s): CHOL, HDL, LDLCALC, TRIG, CHOLHDL, LDLDIRECT in the last 72 hours. Thyroid Function Tests: No results for input(s): TSH, T4TOTAL, FREET4, T3FREE, THYROIDAB in the last 72 hours. Anemia Panel: No results for input(s): VITAMINB12, FOLATE, FERRITIN, TIBC, IRON, RETICCTPCT in the last 72 hours. Sepsis Labs: No results for input(s): PROCALCITON, LATICACIDVEN in the last 168 hours.  Recent Results (from the past 240 hour(s))  Culture, blood (routine x 2)      Status: None (Preliminary result)   Collection Time: 03/12/18  1:07 PM  Result Value Ref Range Status   Specimen Description   Final    BLOOD RIGHT ARM Performed at [facility] [address] [place], [place] [address]    Special Requests   Final    BOTTLES DRAWN AEROBIC ONLY Blood Culture results may not be optimal due to an inadequate volume of blood received in culture bottles Performed at [facility] [address], [place], [place] [address]    Culture   Final    NO GROWTH 4 DAYS Performed at [facility], [place] [address], [place], [place] [address]    Report Status PENDING  Incomplete  Culture, blood (routine x 2)     Status: None (Preliminary result)   Collection Time: 03/12/18  1:15 PM  Result Value Ref Range Status   Specimen Description   Final    BLOOD RIGHT HAND Performed at [facility] [address] [place], [place] [address]    Special Requests   Final  BOTTLES DRAWN AEROBIC ONLY Blood Culture results may not be optimal due to an inadequate volume of blood received in culture bottles Performed at [facility], [place] [address] [place], [place] [address]    Culture   Final    NO GROWTH 4 DAYS Performed at [facility], [place] [address] [place], [place] [address]    Report Status PENDING  Incomplete         Radiology Studies: No results found.      Scheduled Meds: . sodium chloride   Intravenous Once  . atorvastatin  40 mg Oral q1800  . feeding supplement  1 Container Oral TID BM  . feeding supplement (ENSURE ENLIVE)  237 mL Oral BID BM  . megestrol  400 mg Oral BID  . multivitamin with minerals  1 tablet Oral Daily  . polyethylene glycol  17 g Oral Daily   Continuous Infusions: . sodium chloride 100 mL/hr at 03/17/18 1053  . cefTRIAXone (ROCEPHIN)  IV       LOS: 0 days    Time spent: 25 mins.More than 50% of that time was spent in counseling and/or  coordination of care.      Shelly Coss, MD [facility] Pager [phone]  If 7PM-7AM, please contact night-coverage [email or web address] Password [email or web address] 03/17/2018, 1:24 PM

## 2018-03-17 NOTE — Evaluation (Signed)
Physical Therapy Evaluation Patient Details Name: Billy Fuller MRN: [id number] DOB: Aug 05, 1945 Today's Date: 03/17/2018   History of Present Illness  Patient is a 72 y/o male presenting to the ED on 03/16/18 wiht primary complaints of weakness. PMH significant for metastatic prostate cancer s/p prostatectomy, HLD, CVA, radiation cystitis s/p cystectomy with ileal conduit on 9/4. ADmitted for weakness and dehydration.    Clinical Impression  Billy Fuller is a very pleasant 72 y/o male admitted with the above listed diagnosis. Patient reports that following last admission, he has progressively gotten weaker to the point where he is unable to mobilize. Patient today performing bed mobility and transfers with min guard assist with increased time and effort. Patient reporting lightheadedness throughout with poor tolerance to standing due to weakness (may need to assess orthostatic vitals at next visit). Patient to benefit from short term SNF at discharge to progress safe and independent functional mobility to allow for safe return home. PT to follow acutely.    Follow Up Recommendations SNF    Equipment Recommendations  Other (comment)(defer to next venue of care)    Recommendations for Other Services       Precautions / Restrictions Precautions Precautions: Fall Precaution Comments: R ostomy Restrictions Weight Bearing Restrictions: No      Mobility  Bed Mobility Overal bed mobility: Needs Assistance Bed Mobility: Supine to Sit     Supine to sit: HOB elevated;Supervision     General bed mobility comments: increased time and effort; required sitting EOB x 5 min due to dizziness  Transfers Overall transfer level: Needs assistance Equipment used: Rolling walker (2 wheeled) Transfers: Risk manager;Sit to/from Stand Sit to Stand: Min guard Stand pivot transfers: Min guard       General transfer comment: min guard for safety and stability; shuffle steps for pivot  transfer  Ambulation/Gait             General Gait Details: deferred for patient/therapist safety; poor tolerance to standing transfers due to weakness  Stairs            Wheelchair Mobility    Modified Rankin (Stroke Patients Only)       Balance Overall balance assessment: Mild deficits observed, not formally tested                                           Pertinent Vitals/Pain Pain Assessment: No/denies pain    Home Living Family/patient expects to be discharged to:: Private residence Living Arrangements: Spouse/significant other Available Help at Discharge: Family Type of Home: House Home Access: Stairs to enter Entrance Stairs-Rails: None Entrance Stairs-Number of Steps: 3 Home Layout: One level Home Equipment: Environmental consultant - 2 wheels;Shower seat      Prior Function Level of Independence: Needs assistance   Gait / Transfers Assistance Needed: required use of RW  ADL's / Homemaking Assistance Needed: reports fiance assisted with ADLs and household chores  Comments: patient report progressive weakness and inability to ambulate     Hand Dominance        Extremity/Trunk Assessment        Lower Extremity Assessment Lower Extremity Assessment: Generalized weakness    Cervical / Trunk Assessment Cervical / Trunk Assessment: Normal  Communication   Communication: No difficulties  Cognition Arousal/Alertness: Awake/alert Behavior During Therapy: WFL for tasks assessed/performed Overall Cognitive Status: Within Functional Limits for tasks assessed  General Comments General comments (skin integrity, edema, etc.): fiance left note detailing patients current status - requesting higher level of care as she feels she can not provide the level of care he requires    Exercises     Assessment/Plan    PT Assessment Patient needs continued PT services  PT Problem List Decreased  balance;Decreased mobility;Decreased activity tolerance;Decreased knowledge of use of DME;Decreased strength       PT Treatment Interventions DME instruction;Gait training;Functional mobility training;Therapeutic activities;Balance training;Patient/family education;Therapeutic exercise    PT Goals (Current goals can be found in the Care Plan section)  Acute Rehab PT Goals Patient Stated Goal: regain strength PT Goal Formulation: With patient Time For Goal Achievement: 03/31/18 Potential to Achieve Goals: Good    Frequency Min 2X/week   Barriers to discharge        Co-evaluation               AM-PAC PT "6 Clicks" Daily Activity  Outcome Measure Difficulty turning over in bed (including adjusting bedclothes, sheets and blankets)?: A Little Difficulty moving from lying on back to sitting on the side of the bed? : A Lot Difficulty sitting down on and standing up from a chair with arms (e.g., wheelchair, bedside commode, etc,.)?: Unable Help needed moving to and from a bed to chair (including a wheelchair)?: A Little Help needed walking in hospital room?: A Lot Help needed climbing 3-5 steps with a railing? : Total 6 Click Score: 12    End of Session Equipment Utilized During Treatment: Gait belt Activity Tolerance: Patient tolerated treatment well;Patient limited by fatigue Patient left: in chair;with call bell/phone within reach Nurse Communication: Mobility status PT Visit Diagnosis: Difficulty in walking, not elsewhere classified (R26.2);Unsteadiness on feet (R26.81);Muscle weakness (generalized) (M62.81)    Time: 1243-1310 PT Time Calculation (min) (ACUTE ONLY): 27 min   Charges:   PT Evaluation $PT Eval Moderate Complexity: 1 Mod PT Treatments $Therapeutic Activity: 8-22 mins       Lanney Gins, PT, DPT Supplemental Physical Therapist 03/17/18 1:33 PM Pager: [phone] Office: [phone]

## 2018-03-17 NOTE — Clinical Social Work Note (Signed)
Clinical Social Work Assessment  Patient Details  Name: Billy Fuller MRN: [id number] Date of Birth: April 14, 1946  Date of referral:  03/17/18               Reason for consult:  Facility Placement                Permission sought to share information with:  Facility Art therapist granted to share information::  Yes, Verbal Permission Granted  Name::     Billy Fuller  Agency::  SNF  Relationship::  Significant other  Contact Information:  [phone]  Housing/Transportation Living arrangements for the past 2 months:  [place] of Information:  Patient, Partner Patient Interpreter Needed:  None Criminal Activity/Legal Involvement Pertinent to Current Situation/Hospitalization:  No - Comment as needed Significant Relationships:  Adult Children, Significant Other Lives with:  Significant Other Do you feel safe going back to the place where you live?  No Need for family participation in patient care:  Yes (Comment)  Care giving concerns:  Patient lives with significant other, Billy, who expresses concern with ability to care for patient if he were to return home in current state. She stated it has been challenging over the past few months and they do not have family or friends nearby who can offer support.   Social Worker assessment / plan:  CSW met with patient and significant other, Billy, at bedside to discuss discharge plan and SNF referral process. Patient lives at home with significant other and has a son in [place]. No other support nearby. Billy expressed worry about patient coming home and not being able to care for him. She spoke about how challenging it has been over the past ~6 months because patient has not been eating. She stated she has tried many different variations on food and shakes but nothing has worked. Patient was quiet but agreed that he has no appetite and nothing tastes good.  Patient has not previously been to SNF before and  has no preferences. He is agreeable to SNF placement at d/c and is hopeful it will help him regain strength and be able to care for himself at home.  CSW will complete FL2 and send out referrals.   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 They responded appropriately to CSW's questions and had no concerns.  Patient/Family's Understanding of and Emotional Response to Diagnosis, Current Treatment, and Prognosis:  Patient and significant other understand SNF process and are agreeable for plan to d/c to SNF for short term rehab. Patient expressed hopefulness to regain strength and to be able to care for himself better at home.   Emotional Assessment Appearance:  Appears stated age Attitude/Demeanor/Rapport:  Gracious Affect (typically observed):  Accepting, Quiet Orientation:  Oriented to Self, Oriented to Place, Oriented to  Time, Oriented to Situation Alcohol / Substance use:  Not Applicable Psych involvement (Current and /or in the community):  No (Comment)  Discharge Needs  Concerns to be addressed:  Care Coordination Readmission within the last 30 days:  Yes Current discharge risk:  Physical Impairment Barriers to Discharge:  Continued Medical Work up   [employer], [place] 03/17/2018, 4:33 PM

## 2018-03-18 ENCOUNTER — Encounter (HOSPITAL_COMMUNITY): Payer: Self-pay | Admitting: *Deleted

## 2018-03-18 LAB — TYPE AND SCREEN
ABO/RH(D): A POS
Antibody Screen: NEGATIVE
Unit division: 0

## 2018-03-18 LAB — BPAM RBC
BLOOD PRODUCT EXPIRATION DATE: 201910022359
ISSUE DATE / TIME: 201909151714
Unit Type and Rh: 6200

## 2018-03-18 LAB — CBC WITH DIFFERENTIAL/PLATELET
BASOS ABS: 0 10*3/uL (ref 0.0–0.1)
Basophils Relative: 0 %
EOS ABS: 0.3 10*3/uL (ref 0.0–0.7)
EOS PCT: 4 %
HCT: 26.4 % — ABNORMAL LOW (ref 39.0–52.0)
Hemoglobin: 8.6 g/dL — ABNORMAL LOW (ref 13.0–17.0)
LYMPHS PCT: 21 %
Lymphs Abs: 1.2 10*3/uL (ref 0.7–4.0)
MCH: 27.7 pg (ref 26.0–34.0)
MCHC: 32.6 g/dL (ref 30.0–36.0)
MCV: 84.9 fL (ref 78.0–100.0)
Monocytes Absolute: 0.5 10*3/uL (ref 0.1–1.0)
Monocytes Relative: 8 %
Neutro Abs: 4 10*3/uL (ref 1.7–7.7)
Neutrophils Relative %: 67 %
PLATELETS: 282 10*3/uL (ref 150–400)
RBC: 3.11 MIL/uL — AB (ref 4.22–5.81)
RDW: 15.5 % (ref 11.5–15.5)
WBC: 6 10*3/uL (ref 4.0–10.5)

## 2018-03-18 NOTE — NC FL2 (Signed)
[place] LEVEL OF CARE SCREENING TOOL     IDENTIFICATION  Patient Name: Billy Fuller Birthdate: December 02, 1945 Sex: male Admission Date (Current Location): 03/16/2018  [facility] and [place] Number:  Herbalist and Address:  [facility],  [place] [address], [place]      Provider Number: [id number]  Attending Physician Name and Address:  Shelly Coss, MD  Relative Name and Phone Number:       Current Level of Care: Hospital Recommended Level of Care: [place] Prior Approval Number:    Date Approved/Denied:   PASRR Number: [id number]  Discharge Plan: SNF    Current Diagnoses: Patient Active Problem List   Diagnosis Date Noted  . Acute lower UTI 03/17/2018  . History of CVA (cerebrovascular accident) 03/17/2018  . Weakness 03/17/2018  . Acute blood loss anemia 03/17/2018  . Dehydration 03/16/2018  . Protein-calorie malnutrition, severe 03/09/2018  . S/P ileal conduit ([place]) 03/06/2018  . Chronic radiation cystitis 03/06/2018  . Gross hematuria 12/11/2017  . Radiation cystitis 09/04/2017  . Acute CVA (cerebrovascular accident) ([place]) 05/31/2016  . Cerebrovascular accident (CVA) ([place])   . Left-sided weakness 05/30/2016  . TIA (transient ischemic attack) 05/30/2016  . Impacted cerumen of right ear 05/01/2016  . Prostate cancer ([place]) 05/16/2011  . Prostate CA ([place]) 08/06/2009    Orientation RESPIRATION BLADDER Height & Weight     Self, Time, Situation, Place  Normal Continent, Urostomy Weight: 146 lb 2.6 oz (66.3 kg) Height:  5\' 5"  (165.1 cm)  BEHAVIORAL SYMPTOMS/MOOD NEUROLOGICAL BOWEL NUTRITION STATUS      Continent Diet(regular diet)  AMBULATORY STATUS COMMUNICATION OF NEEDS Skin   Extensive Assist Verbally (3 abdominal ports, liquid skin adhesive dressing)                       Personal Care Assistance Level of Assistance  Bathing, Feeding, Dressing Bathing Assistance: Maximum  assistance Feeding assistance: Independent Dressing Assistance: Maximum assistance     Functional Limitations Info  Sight, Hearing, Speech Sight Info: Adequate Hearing Info: Adequate      SPECIAL CARE FACTORS FREQUENCY  PT (By licensed PT), OT (By licensed OT)     PT Frequency: 5x OT Frequency: 5x            Contractures Contractures Info: Not present    Additional Factors Info  Code Status, Allergies Code Status Info: full code Allergies Info: nka           Current Medications (03/18/2018):  This is the current hospital active medication list Current Facility-Administered Medications  Medication Dose Route Frequency Provider Last Rate Last Dose  . 0.9 %  sodium chloride infusion (Manually program via Guardrails IV Fluids)   Intravenous Once Tamala Julian, Rondell A, MD      . 0.9 %  sodium chloride infusion   Intravenous Continuous Fuller Plan A, MD 100 mL/hr at 03/18/18 1032    . acetaminophen (TYLENOL) tablet 650 mg  650 mg Oral Q6H PRN Norval Morton, MD       Or  . acetaminophen (TYLENOL) suppository 650 mg  650 mg Rectal Q6H PRN Smith, Rondell A, MD      . albuterol (PROVENTIL) (2.5 MG/3ML) 0.083% nebulizer solution 2.5 mg  2.5 mg Nebulization Q6H PRN Smith, Rondell A, MD      . atorvastatin (LIPITOR) tablet 40 mg  40 mg Oral q1800 Fuller Plan A, MD   40 mg at 03/17/18 1812  .  cefTRIAXone (ROCEPHIN) 2 g in sodium chloride 0.9 % 100 mL IVPB  2 g Intravenous Q24H Shelly Coss, MD   Stopped at 03/18/18 0053  . feeding supplement (ENSURE ENLIVE) (ENSURE ENLIVE) liquid 237 mL  237 mL Oral BID BM Smith, Rondell A, MD      . megestrol (MEGACE) 400 MG/10ML suspension 400 mg  400 mg Oral BID Shelly Coss, MD   400 mg at 03/17/18 1050  . multivitamin with minerals tablet 1 tablet  1 tablet Oral Daily Smith, Rondell A, MD      . ondansetron (ZOFRAN) tablet 4 mg  4 mg Oral Q6H PRN Fuller Plan A, MD       Or  . ondansetron (ZOFRAN) injection 4 mg  4 mg Intravenous  Q6H PRN Smith, Rondell A, MD      . oxyCODONE-acetaminophen (PERCOCET/ROXICET) 5-325 MG per tablet 1-2 tablet  1-2 tablet Oral Q6H PRN Smith, Rondell A, MD      . polyethylene glycol (MIRALAX / GLYCOLAX) packet 17 g  17 g Oral Daily Shelly Coss, MD   17 g at 03/17/18 1447  . senna-docusate (Senokot-S) tablet 1 tablet  1 tablet Oral QHS PRN Norval Morton, MD         Discharge Medications: Please see discharge summary for a list of discharge medications.  Relevant Imaging Results:  Relevant Lab Results:   Additional Information SS# [id number]  Nila Nephew, LCSW

## 2018-03-18 NOTE — Progress Notes (Signed)
CSW completed FL2 and made referrals to area SNFs and initiated [employer] insurance authorization. Updated pt's significant other Terri and will follow up with bed offers.   Sharren Bridge, MSW, LCSW Clinical Social Work 03/18/2018 [phone]

## 2018-03-18 NOTE — Progress Notes (Signed)
Physical Therapy Treatment Patient Details Name: Billy Fuller MRN: [id number] DOB: 09-Jan-1946 Today's Date: 03/18/2018    History of Present Illness Patient is a 72 y/o male presenting to the ED on 03/16/18 wiht primary complaints of weakness. PMH significant for metastatic prostate cancer s/p prostatectomy, HLD, CVA, radiation cystitis s/p cystectomy with ileal conduit on 9/4. ADmitted for weakness and dehydration.    PT Comments    Pt required some encouragement to amb in hallway. Used a 4 WW with seat due to limited activity tolerance and weakness.  Pt present with gait instability, B LE weakness and limited activity tolerance.   Pt will need ST Rehab at SNF prior to safely returning home.    Follow Up Recommendations  SNF     Equipment Recommendations       Recommendations for Other Services       Precautions / Restrictions Precautions Precautions: Fall Precaution Comments: R ostomy Restrictions Weight Bearing Restrictions: No    Mobility  Bed Mobility Overal bed mobility: Needs Assistance Bed Mobility: Supine to Sit     Supine to sit: HOB elevated;Min guard;Min assist     General bed mobility comments: increased time and effort; fatigue, weak   Transfers Overall transfer level: Needs assistance Equipment used: 4-wheeled walker Transfers: Sit to/from [facility] Sit to Stand: Min assist Stand pivot transfers: Min assist       General transfer comment: min assist for safety and unsteady with MAX c/o weakness, fatigue and no energy  Ambulation/Gait Ambulation/Gait assistance: Min assist Gait Distance (Feet): 38 Feet Assistive device: 4-wheeled walker Gait Pattern/deviations: Step-through pattern;Drifts right/left Gait velocity: decreased   General Gait Details: limited anb distance due to weakness, fatigue and "no energy".  Used a Rollator 4 ww as pt required a seated rest break.  Unsteady with poor balance.  HIGH FALL RISK.   Stairs              Wheelchair Mobility    Modified Rankin (Stroke Patients Only)       Balance                                            Cognition Arousal/Alertness: Awake/alert Behavior During Therapy: WFL for tasks assessed/performed Overall Cognitive Status: Within Functional Limits for tasks assessed                                 General Comments: a little Grumpy, pt ia a re admit and not happy about it      Exercises      General Comments        Pertinent Vitals/Pain Pain Assessment: No/denies pain    Home Living                      Prior Function            PT Goals (current goals can now be found in the care plan section) Progress towards PT goals: Progressing toward goals    Frequency    Min 2X/week      PT Plan Current plan remains appropriate    Co-evaluation              AM-PAC PT "6 Clicks" Daily Activity  Outcome Measure  Difficulty turning over in bed (including adjusting bedclothes,  sheets and blankets)?: A Little Difficulty moving from lying on back to sitting on the side of the bed? : A Lot Difficulty sitting down on and standing up from a chair with arms (e.g., wheelchair, bedside commode, etc,.)?: Unable Help needed moving to and from a bed to chair (including a wheelchair)?: A Little Help needed walking in hospital room?: A Lot   6 Click Score: 11    End of Session Equipment Utilized During Treatment: Gait belt Activity Tolerance: Patient limited by fatigue Patient left: in bed;with call bell/phone within reach;with bed alarm set Nurse Communication: Mobility status PT Visit Diagnosis: Difficulty in walking, not elsewhere classified (R26.2);Unsteadiness on feet (R26.81);Muscle weakness (generalized) (M62.81)     Time: 0240-9735 PT Time Calculation (min) (ACUTE ONLY): 25 min  Charges:  $Gait Training: 8-22 mins $Therapeutic Activity: 8-22 mins                     Rica Koyanagi  PTA Acute  Rehabilitation Services Pager      [phone] Office      [phone]

## 2018-03-18 NOTE — Progress Notes (Signed)
Initial Nutrition Assessment  DOCUMENTATION CODES:   Severe malnutrition in context of chronic illness  INTERVENTION:    RD to encourage PO intake  Provide MVI daily  NUTRITION DIAGNOSIS:   Severe Malnutrition related to chronic illness, cancer and cancer related treatments as evidenced by percent weight loss, energy intake < or equal to 75% for > or equal to 1 month, mild fat depletion, severe muscle depletion, moderate muscle depletion.  GOAL:   Patient will meet greater than or equal to 90% of their needs  MONITOR:   PO intake, Supplement acceptance, TF tolerance, Weight trends  REASON FOR ASSESSMENT:   Consult Assessment of nutrition requirement/status  ASSESSMENT:   Patient with PMH significant for CVA, prostate cancer s/p prostatectomy in 2011 and radiation/chemothrapy. Recently admitted 9/3 with end stage radiation cystitis, clot retention, severe dysuria, and is ileal conduit on 9/4. Presents this admission with weakness, dehydration, and UTI.    Pt endorses having a decrease in PO intake over the last 3-4 months due to recurrent hospitalizations. States after his admission 9/3 he became very weak and was unable to walk. He lost his appetite and could only tolerate bites of baked chicken, chicken wings, and sandwiches. He is positive for taste changes and describes them as "food tastes like rubber." He used to drink Ensure in attempts to gain weight but cannot stand the taste of them anymore. Pt ate cereal this morning without complication. When asked if pt wanted to try Magic Cup pt states "I thought I was leaving today, I don't want to start anything new." RD encouraged intake of high protein high calorie food/supplement options to help maintain/increase weight. Will continue to monitor for needs. Plan for pt to d/c to rehab facility.   Pt endorses a UBW of 170-175 lb the last time being at that weight 3-4 months ago. Records indicate pt weighed 168 lb 11/22/17 and 146 lb  this admission (13.0% wt loss in 4 months, significant for time frame). Nutrition-Focused physical exam completed.   Medications reviewed and include: megace BID, MVI with minerals Labs reviewed.   NUTRITION - FOCUSED PHYSICAL EXAM:    Most Recent Value  Orbital Region  No depletion  Upper Arm Region  Moderate depletion  Thoracic and Lumbar Region  Unable to assess  Buccal Region  Mild depletion  Temple Region  Mild depletion  Clavicle Bone Region  Severe depletion  Clavicle and Acromion Bone Region  Severe depletion  Scapular Bone Region  Unable to assess  Dorsal Hand  Mild depletion  Patellar Region  Severe depletion  Anterior Thigh Region  Severe depletion  Posterior Calf Region  Severe depletion  Edema (RD Assessment)  None     Diet Order:   Diet Order            Diet regular Room service appropriate? Yes; Fluid consistency: Thin  Diet effective now              EDUCATION NEEDS:   Education needs have been addressed  Skin:  Skin Assessment: Reviewed RN Assessment  Last BM:  03/15/18  Height:   Ht Readings from Last 1 Encounters:  03/17/18 5\' 5"  (1.651 m)    Weight:   Wt Readings from Last 1 Encounters:  03/17/18 66.3 kg    Ideal Body Weight:  61.8 kg  BMI:  Body mass index is 24.32 kg/m.  Estimated Nutritional Needs:   Kcal:  1900-2100 kcal  Protein:  95-110 grams  Fluid:  >/= 1.9 L/day  Mariana Single RD, LDN Clinical Nutrition Pager # [phone]

## 2018-03-18 NOTE — Progress Notes (Signed)
PROGRESS NOTE    Billy Fuller  ASN:[id number] DOB: June 21, 1946 DOA: 03/16/2018 PCP: [name], MD   Brief Narrative: Patient is a 59 male with past medical history of metastatic prostate cancer status post prostatectomy, CVA, radiation cystitis status post cystectomy with ileal conduit on 9/4 who presented from home with complaints of generalized weakness, inability to ambulate and loss of appetite/weight.  Since his last surgery, patient reported to have been gradually weak.  On admission, urinalysis was also suggestive of UTI.  He was admitted for management of urinary tract infection, poor appetite and generalized weakness.  Currently his overall status has significantly improved.  Assessment & Plan:   Principal Problem:   Dehydration Active Problems:   Prostate cancer ([place])   S/P ileal conduit ([facility])   Acute lower UTI   History of CVA (cerebrovascular accident)   Weakness   Acute blood loss anemia  Suspected UTI: Urinalysis was positive urine tract infection.  Urine culture showed 60,000 colonies of gram-negative rods.  Will follow final sensitivity report.  He started on ceftriaxone.  Currently patient is hemodynamically stable.  He is not septic.  Weakness: Complains of gradual weakness.  Since surgery, he has been unable to even ambulate.  Wife was tearful saying that she is not able to take care of him at home.  Physical therapy evaluation and he has been recommended skilled nursing facility on discharge. Weakness could be secondary to poor oral intake, dehydration.  Continue gentle IV fluids.  Poor oral intake/loss of appetite: Appetite has significantly improved this morning.  Reports severe loss of appetite. He had complained earlier  that the food feels like  Rubber. Started on Megace.  Nutrition consultation requested.  Reported to have lost 30 pounds since last 4 to 6 months.  Anemia: Most likely this episode with recent surgery.  His last hemoglobin at the time of  discharge was 9.1.  Hemoglobin of 7.4 patient and he was transfused 1 unit of PRBC.  Hemoglobin this morning is 8.4.  FOBT negative.   Metastatic prostate cancer:Patient had prostatectomy 2011 for Stage 3 disease, then salvage radiation, and Abiraterone trial [facility] until 2015. PSA rise to 30 in 2018 and resumed androgen deprivation.   Continue outpatient follow-up with Dr. Alen Blew.  Radiation cystitis: He had cystectomy with ileal conduit on 9/4 by Dr. [name].  Urology following.  History of CVA: No residual weakness.  Continue current meds    DVT prophylaxis: SCD Code Status: Full code Family Communication: None present at the bedside Disposition Plan:SNF tomorrow.  Waiting for nutrition evaluation   Consultants: Urology  Procedures: None  Antimicrobials: Ceftriaxone  Subjective: Patient seen and examined the bedside this morning.  Remains comfortable.  He feels great today.  His appetite has improved.  His weakness has improved. Objective: Vitals:   03/17/18 1735 03/17/18 1747 03/17/18 2004 03/18/18 0453  BP: 109/61  113/65 107/67  Pulse: 78  80 69  Resp: 16  16 20   Temp: 98.2 F (36.8 C)  98.1 F (36.7 C) 98.4 F (36.9 C)  TempSrc: Oral  Oral Oral  SpO2: 99%  99% 99%  Weight:  66.3 kg    Height:  5\' 5"  (1.651 m)      Intake/Output Summary (Last 24 hours) at 03/18/2018 1246 Last data filed at 03/18/2018 1108 Gross per 24 hour  Intake 2365.94 ml  Output 2350 ml  Net 15.94 ml   Filed Weights   03/17/18 0500 03/17/18 1747  Weight: 66.3 kg 66.3  kg    Examination:  General exam: Appears calm and comfortable ,Not in distress,average built HEENT:PERRL,Oral mucosa moist, Ear/Nose normal on gross exam Respiratory system: Bilateral equal air entry, normal vesicular breath sounds, no wheezes or crackles  Cardiovascular system: S1 & S2 heard, RRR. No JVD, murmurs, rubs, gallops or clicks. Gastrointestinal system: Abdomen is nondistended, soft and nontender. No  organomegaly or masses felt. Normal bowel sounds heard.Clean surgical scars, urostomy Central nervous system: Alert and oriented. No focal neurological deficits. Extremities: No edema, no clubbing ,no cyanosis, distal peripheral pulses palpable. Skin: No rashes, lesions or ulcers,no icterus ,no pallor MSK: Normal muscle bulk,tone ,power Psychiatry: Judgement and insight appear normal. Mood & affect appropriate.        Data Reviewed: I have personally reviewed following labs and imaging studies  CBC: Recent Labs  Lab 03/12/18 1307 03/16/18 1859 03/17/18 0429 03/17/18 2237 03/18/18 0403  WBC  --  6.4 5.2  --  6.0  NEUTROABS  --   --   --   --  4.0  HGB 9.1* 8.3* 7.4* 8.4* 8.6*  HCT 28.2* 25.9* 23.1* 25.8* 26.4*  MCV  --  84.4 86.2  --  84.9  PLT  --  435* 285  --  932   Basic Metabolic Panel: Recent Labs  Lab 03/12/18 1307 03/16/18 1859 03/17/18 0429  NA 137 138 139  K 4.5 4.0 3.6  CL 103 105 108  CO2 20* 22 23  GLUCOSE 264* 122* 102*  BUN 11 25* 17  CREATININE 1.15 0.81 0.70  CALCIUM 8.2* 8.7* 8.0*   GFR: Estimated Creatinine Clearance: 70.5 mL/min (by C-G formula based on SCr of 0.7 mg/dL). Liver Function Tests: No results for input(s): AST, ALT, ALKPHOS, BILITOT, PROT, ALBUMIN in the last 168 hours. No results for input(s): LIPASE, AMYLASE in the last 168 hours. No results for input(s): AMMONIA in the last 168 hours. Coagulation Profile: No results for input(s): INR, PROTIME in the last 168 hours. Cardiac Enzymes: No results for input(s): CKTOTAL, CKMB, CKMBINDEX, TROPONINI in the last 168 hours. BNP (last 3 results) No results for input(s): PROBNP in the last 8760 hours. HbA1C: No results for input(s): HGBA1C in the last 72 hours. CBG: Recent Labs  Lab 03/12/18 1149  GLUCAP 255*   Lipid Profile: No results for input(s): CHOL, HDL, LDLCALC, TRIG, CHOLHDL, LDLDIRECT in the last 72 hours. Thyroid Function Tests: No results for input(s): TSH, T4TOTAL,  FREET4, T3FREE, THYROIDAB in the last 72 hours. Anemia Panel: No results for input(s): VITAMINB12, FOLATE, FERRITIN, TIBC, IRON, RETICCTPCT in the last 72 hours. Sepsis Labs: No results for input(s): PROCALCITON, LATICACIDVEN in the last 168 hours.  Recent Results (from the past 240 hour(s))  Culture, blood (routine x 2)     Status: None   Collection Time: 03/12/18  1:07 PM  Result Value Ref Range Status   Specimen Description   Final    BLOOD RIGHT ARM Performed at [facility] [address], [place], [place] [address]    Special Requests   Final    BOTTLES DRAWN AEROBIC ONLY Blood Culture results may not be optimal due to an inadequate volume of blood received in culture bottles Performed at [facility] [address] [place], [place] [address]    Culture   Final    NO GROWTH 5 DAYS Performed at [facility], [place] [address], [place], [place] [address]    Report Status 03/17/2018 FINAL  Final  Culture,  blood (routine x 2)     Status: None   Collection Time: 03/12/18  1:15 PM  Result Value Ref Range Status   Specimen Description   Final    BLOOD RIGHT HAND Performed at [facility] [address], [place], [place] [address]    Special Requests   Final    BOTTLES DRAWN AEROBIC ONLY Blood Culture results may not be optimal due to an inadequate volume of blood received in culture bottles Performed at [facility] [address], [place], [place] [address]    Culture   Final    NO GROWTH 5 DAYS Performed at [facility], [place] [address], [place], [place] [address]    Report Status 03/17/2018 FINAL  Final  Urine Culture     Status: Abnormal (Preliminary result)   Collection Time: 03/16/18  9:03 PM  Result Value Ref Range Status   Specimen Description   Final    URINE, CLEAN CATCH Performed at [facility], [place] [address] [place], [place] [address]    Special  Requests   Final    NONE Performed at [facility], [place] [address] [place], [place] [address]    Culture 60,000 COLONIES/mL GRAM NEGATIVE RODS (A)  Final   Report Status PENDING  Incomplete         Radiology Studies: No results found.      Scheduled Meds: . sodium chloride   Intravenous Once  . atorvastatin  40 mg Oral q1800  . feeding supplement (ENSURE ENLIVE)  237 mL Oral BID BM  . megestrol  400 mg Oral BID  . multivitamin with minerals  1 tablet Oral Daily  . polyethylene glycol  17 g Oral Daily   Continuous Infusions: . sodium chloride 100 mL/hr at 03/18/18 1032  . cefTRIAXone (ROCEPHIN)  IV Stopped (03/18/18 0053)     LOS: 1 day    Time spent: 25 mins.More than 50% of that time was spent in counseling and/or coordination of care.      Shelly Coss, MD [facility] Pager [phone]  If 7PM-7AM, please contact night-coverage [email or web address] Password [facility] 03/18/2018, 12:46 PM

## 2018-03-19 DIAGNOSIS — R488 Other symbolic dysfunctions: Secondary | ICD-10-CM | POA: Diagnosis not present

## 2018-03-19 DIAGNOSIS — Z48816 Encounter for surgical aftercare following surgery on the genitourinary system: Secondary | ICD-10-CM | POA: Diagnosis not present

## 2018-03-19 DIAGNOSIS — C61 Malignant neoplasm of prostate: Secondary | ICD-10-CM | POA: Diagnosis not present

## 2018-03-19 DIAGNOSIS — R634 Abnormal weight loss: Secondary | ICD-10-CM | POA: Diagnosis not present

## 2018-03-19 DIAGNOSIS — M6281 Muscle weakness (generalized): Secondary | ICD-10-CM | POA: Diagnosis not present

## 2018-03-19 DIAGNOSIS — E860 Dehydration: Secondary | ICD-10-CM | POA: Diagnosis not present

## 2018-03-19 DIAGNOSIS — D649 Anemia, unspecified: Secondary | ICD-10-CM | POA: Diagnosis not present

## 2018-03-19 DIAGNOSIS — Z436 Encounter for attention to other artificial openings of urinary tract: Secondary | ICD-10-CM | POA: Diagnosis not present

## 2018-03-19 DIAGNOSIS — Z936 Other artificial openings of urinary tract status: Secondary | ICD-10-CM | POA: Diagnosis not present

## 2018-03-19 DIAGNOSIS — R531 Weakness: Secondary | ICD-10-CM | POA: Diagnosis not present

## 2018-03-19 DIAGNOSIS — E43 Unspecified severe protein-calorie malnutrition: Secondary | ICD-10-CM | POA: Diagnosis not present

## 2018-03-19 DIAGNOSIS — N390 Urinary tract infection, site not specified: Secondary | ICD-10-CM | POA: Diagnosis not present

## 2018-03-19 DIAGNOSIS — I639 Cerebral infarction, unspecified: Secondary | ICD-10-CM | POA: Diagnosis not present

## 2018-03-19 DIAGNOSIS — R2689 Other abnormalities of gait and mobility: Secondary | ICD-10-CM | POA: Diagnosis not present

## 2018-03-19 DIAGNOSIS — N3040 Irradiation cystitis without hematuria: Secondary | ICD-10-CM | POA: Diagnosis not present

## 2018-03-19 DIAGNOSIS — Z9889 Other specified postprocedural states: Secondary | ICD-10-CM | POA: Diagnosis not present

## 2018-03-19 LAB — URINE CULTURE: Culture: 60000 — AB

## 2018-03-19 MED ORDER — CEFDINIR 300 MG PO CAPS: 300.0000 mg | ORAL_CAPSULE | Freq: Two times a day (BID) | ORAL | 0 refills | Status: AC

## 2018-03-19 MED ORDER — SENNOSIDES-DOCUSATE SODIUM 8.6-50 MG PO TABS: 1.0000 | ORAL_TABLET | Freq: Every evening | ORAL | Status: DC | PRN

## 2018-03-19 MED ORDER — POLYETHYLENE GLYCOL 3350 17 G PO PACK: 17.0000 g | PACK | Freq: Every day | ORAL | 0 refills | Status: DC

## 2018-03-19 MED ORDER — MEGESTROL ACETATE 40 MG/ML PO SUSP: 400.0000 mg | Freq: Two times a day (BID) | ORAL | 0 refills | Status: AC

## 2018-03-19 MED ORDER — ENSURE ENLIVE PO LIQD: 237.0000 mL | Freq: Two times a day (BID) | ORAL | 12 refills | Status: DC

## 2018-03-19 MED ORDER — OXYCODONE-ACETAMINOPHEN 5-325 MG PO TABS: 1.0000 | ORAL_TABLET | ORAL | 0 refills | Status: DC | PRN

## 2018-03-19 NOTE — Discharge Summary (Signed)
Physician Discharge Summary  Billy Fuller:[id number] DOB: [birth year] DOA: 03/16/2018  PCP: Billy Jordan, MD  Admit date: 03/16/2018 Discharge date: 03/19/2018  Admitted From: Home Disposition:  SNF  Discharge Condition:Stable CODE STATUS:FULL Diet recommendation: Heart Healthy   Brief/Interim Summary: Patient is a 65 male with past medical history of metastatic prostate cancer status post prostatectomy, CVA, radiation cystitis status post cystectomy with ileal conduit on 9/4 who presented from home with complaints of generalized weakness, inability to ambulate and loss of appetite/weight.  Since his last surgery, patient reported to have been gradually weak.  On admission, urinalysis was also suggestive of UTI.  He was admitted for management of urinary tract infection, poor appetite and generalized weakness.  Currently his overall status has significantly improved.He was evaluated by physical therapy and recommended skilled nursing facility on discharge.  Following problems were addressed during his hospitalization:  Suspected UTI: Urinalysis was positive urine tract infection.  Urine culture showed 60,000 colonies of Pantoea species.    He was started on ceftriaxone.  Currently patient is hemodynamically stable.  He is not septic.We will change antibiotics to oral on discharge.  Weakness: Complains of gradual weakness.  Since surgery, he has been unable to even ambulate.  Wife was tearful saying that she is not able to take care of him at home.  Physical therapy evaluation done  and he has been recommended skilled nursing facility on discharge. Weakness could be secondary to poor oral intake, dehydration.    Poor oral intake/loss of appetite: Appetite has slightly  improved this morning.  Reports severe loss of appetite. He had complained earlier  that the food feels like  Rubber. Started on Megace.  Nutrition consultation done.  Reported to have lost 30 pounds since last 4 to 6  months.  Started on feeding supplements.  Anemia: Most likely this episode with recent surgery.  His last hemoglobin at the time of discharge was 9.1.  Hemoglobin of 7.4 patient and he was transfused 1 unit of PRBC.  Hemoglobin this morning is 8.6.  FOBT negative.   Metastatic prostate cancer:Patient had prostatectomy 2011 for Stage 3 disease, then salvage radiation,and Abiraterone trial at [facility] until 2015.He follow-ups with Dr. Jennefer Fuller and also urology.  Radiation cystitis: He had cystectomy with ileal conduit on 9/4 by Dr. [name].  Urology was following.  Follow-up as an outpatient with urology.  History of CVA: No residual weakness.    Discharge Diagnoses:  Principal Problem:   Dehydration Active Problems:   Prostate cancer ([place])   S/P ileal conduit (HCC)   Acute lower UTI   History of CVA (cerebrovascular accident)   Weakness   Acute blood loss anemia    Discharge Instructions  Discharge Instructions    Diet - low sodium heart healthy   Complete by:  As directed    Discharge instructions   Complete by:  As directed    1)Take prescribed medications as instructed. 2)Do a CBC and BMP tests in a week. 3)Follow up with your urologist as an outpatient.   Increase activity slowly   Complete by:  As directed      Allergies as of 03/19/2018   No Known Allergies     Medication List    TAKE these medications   atorvastatin 40 MG tablet Commonly known as:  LIPITOR Take 1 tablet (40 mg total) by mouth daily at 6 PM.   cefdinir 300 MG capsule Commonly known as:  OMNICEF Take 1 capsule (300 mg total) by mouth  2 (two) times daily for 3 days.   feeding supplement (ENSURE ENLIVE) Liqd Take 237 mLs by mouth 2 (two) times daily between meals.   megestrol 40 MG/ML suspension Commonly known as:  MEGACE Take 10 mLs (400 mg total) by mouth 2 (two) times daily.   multivitamin with minerals Tabs tablet Take 1 tablet by mouth daily.   oxyCODONE-acetaminophen 5-325 MG  tablet Commonly known as:  PERCOCET/ROXICET Take 1 tablet by mouth every 4 (four) hours as needed for severe pain. What changed:  Another medication with the same name was removed. Continue taking this medication, and follow the directions you see here.   polyethylene glycol packet Commonly known as:  MIRALAX / GLYCOLAX Take 17 g by mouth daily. Start taking on:  03/20/2018   senna-docusate 8.6-50 MG tablet Commonly known as:  Senokot-S Take 1 tablet by mouth 2 (two) times daily. While taking strong pain meds to prevent constipation. What changed:  Another medication with the same name was added. Make sure you understand how and when to take each.   senna-docusate 8.6-50 MG tablet Commonly known as:  Senokot-S Take 1 tablet by mouth at bedtime as needed for mild constipation. What changed:  You were already taking a medication with the same name, and this prescription was added. Make sure you understand how and when to take each.      Follow-up Information    Billy Jordan, MD. Schedule an appointment as soon as possible for a visit in 1 week(s).   Specialty:  Family Medicine Contact information: [place] [address] [phone]          No Known Allergies  Consultations: Urology  Procedures/Studies:  No results found.    Subjective: Patient seen and examined the bedside this morning.  Remains hemodynamically stable.  He still complains of poor appetite and generalized weakness.  Looks comfortable though.  Stable for discharge to rehab today.  Discharge Exam: Vitals:   03/19/18 0408 03/19/18 0855  BP: 95/71 112/68  Pulse: 69 70  Resp: 10 13  Temp: 98.2 F (36.8 C) 98.1 F (36.7 C)  SpO2: 97% 99%   Vitals:   03/18/18 1452 03/18/18 2125 03/19/18 0408 03/19/18 0855  BP: 108/60 103/66 95/71 112/68  Pulse: 78 78 69 70  Resp: 20 10 10 13   Temp: 98.2 F (36.8 C) 97.9 F (36.6 C) 98.2 F (36.8 C) 98.1 F (36.7 C)   TempSrc: Oral Oral Oral Oral  SpO2: 100% 98% 97% 99%  Weight:      Height:        General: Pt is alert, awake, not in acute distress Cardiovascular: RRR, S1/S2 +, no rubs, no gallops Respiratory: CTA bilaterally, no wheezing, no rhonchi Abdominal: Soft, NT, ND, bowel sounds +,Clean surgical scars, urostomy Extremities: no edema, no cyanosis    The results of significant diagnostics from this hospitalization (including imaging, microbiology, ancillary and laboratory) are listed below for reference.     Microbiology: Recent Results (from the past 240 hour(s))  Culture, blood (routine x 2)     Status: None   Collection Time: 03/12/18  1:07 PM  Result Value Ref Range Status   Specimen Description   Final    BLOOD RIGHT ARM Performed at [facility], [place] [address] [place], [place] [address]    Special Requests   Final    BOTTLES DRAWN AEROBIC ONLY Blood Culture results may not be optimal due to an inadequate volume of  blood received in culture bottles Performed at [facility], [place] [address] [place], [place] [address]    Culture   Final    NO GROWTH 5 DAYS Performed at [facility], [place] [address], [place], [place] [address]    Report Status 03/17/2018 FINAL  Final  Culture, blood (routine x 2)     Status: None   Collection Time: 03/12/18  1:15 PM  Result Value Ref Range Status   Specimen Description   Final    BLOOD RIGHT HAND Performed at [place] [address], [place], [place] [address]    Special Requests   Final    BOTTLES DRAWN AEROBIC ONLY Blood Culture results may not be optimal due to an inadequate volume of blood received in culture bottles Performed at [place] [address] [place], [place] [address]    Culture   Final    NO GROWTH 5 DAYS Performed at [facility], [place] [address] [place], [place] [address]    Report Status 03/17/2018 FINAL  Final   Urine Culture     Status: Abnormal   Collection Time: 03/16/18  9:03 PM  Result Value Ref Range Status   Specimen Description   Final    URINE, CLEAN CATCH Performed at [facility], [place] [address], [place], [place] [address]    Special Requests   Final    NONE Performed at [facility], [place] [address], [place], [place] [address]    Culture (A)  Final    60,000 COLONIES/mL PANTOEA SPECIES Standardized susceptibility testing for this organism is not available. Performed at [facility], [place] [address], [place], [place] [address]    Report Status 03/19/2018 FINAL  Final     Labs: BNP (last 3 results) No results for input(s): BNP in the last 8760 hours. Basic Metabolic Panel: Recent Labs  Lab 03/12/18 1307 03/16/18 1859 03/17/18 0429  NA 137 138 139  K 4.5 4.0 3.6  CL 103 105 108  CO2 20* 22 23  GLUCOSE 264* 122* 102*  BUN 11 25* 17  CREATININE 1.15 0.81 0.70  CALCIUM 8.2* 8.7* 8.0*   Liver Function Tests: No results for input(s): AST, ALT, ALKPHOS, BILITOT, PROT, ALBUMIN in the last 168 hours. No results for input(s): LIPASE, AMYLASE in the last 168 hours. No results for input(s): AMMONIA in the last 168 hours. CBC: Recent Labs  Lab 03/12/18 1307 03/16/18 1859 03/17/18 0429 03/17/18 2237 03/18/18 0403  WBC  --  6.4 5.2  --  6.0  NEUTROABS  --   --   --   --  4.0  HGB 9.1* 8.3* 7.4* 8.4* 8.6*  HCT 28.2* 25.9* 23.1* 25.8* 26.4*  MCV  --  84.4 86.2  --  84.9  PLT  --  435* 285  --  282   Cardiac Enzymes: No results for input(s): CKTOTAL, CKMB, CKMBINDEX, TROPONINI in the last 168 hours. BNP: Invalid input(s): POCBNP CBG: Recent Labs  Lab 03/12/18 1149  GLUCAP 255*   D-Dimer No results for input(s): DDIMER in the last 72 hours. Hgb A1c No results for input(s): HGBA1C in the last 72 hours. Lipid Profile No results for input(s): CHOL, HDL, LDLCALC, TRIG, CHOLHDL, LDLDIRECT in the last 72 hours. Thyroid  function studies No results for input(s): TSH, T4TOTAL, T3FREE, THYROIDAB in the last 72 hours.  Invalid input(s): FREET3 Anemia work up No results for input(s): VITAMINB12,  FOLATE, FERRITIN, TIBC, IRON, RETICCTPCT in the last 72 hours. Urinalysis    Component Value Date/Time   COLORURINE YELLOW 03/16/2018 2103   APPEARANCEUR CLEAR 03/16/2018 2103   LABSPEC 1.011 03/16/2018 2103   PHURINE 7.0 03/16/2018 2103   GLUCOSEU NEGATIVE 03/16/2018 2103   HGBUR MODERATE (A) 03/16/2018 2103   BILIRUBINUR NEGATIVE 03/16/2018 2103   [place] NEGATIVE 03/16/2018 2103   PROTEINUR 100 (A) 03/16/2018 2103   NITRITE NEGATIVE 03/16/2018 2103   LEUKOCYTESUR LARGE (A) 03/16/2018 2103   Sepsis Labs Invalid input(s): PROCALCITONIN,  WBC,  LACTICIDVEN Microbiology Recent Results (from the past 240 hour(s))  Culture, blood (routine x 2)     Status: None   Collection Time: 03/12/18  1:07 PM  Result Value Ref Range Status   Specimen Description   Final    BLOOD RIGHT ARM Performed at [facility], [place] [address] [place], [place] [address]    Special Requests   Final    BOTTLES DRAWN AEROBIC ONLY Blood Culture results may not be optimal due to an inadequate volume of blood received in culture bottles Performed at [place] [address], [place], [place] [address]    Culture   Final    NO GROWTH 5 DAYS Performed at [facility], [place] [address] [place], [place] [address]    Report Status 03/17/2018 FINAL  Final  Culture, blood (routine x 2)     Status: None   Collection Time: 03/12/18  1:15 PM  Result Value Ref Range Status   Specimen Description   Final    BLOOD RIGHT HAND Performed at [place] [address] [place], [place] [address]    Special Requests   Final    BOTTLES DRAWN AEROBIC ONLY Blood Culture results may not be optimal due to an inadequate volume of blood received in culture bottles Performed at [place] [address] [place] [address], [place], [place] [address]    Culture   Final    NO GROWTH 5 DAYS Performed at [facility], [place] [address], [place], [place] [address]    Report Status 03/17/2018 FINAL  Final  Urine Culture     Status: Abnormal   Collection Time: 03/16/18  9:03 PM  Result Value Ref Range Status   Specimen Description   Final    URINE, CLEAN CATCH Performed at [facility], [place] [address] [place], [place] [address]    Special Requests   Final    NONE Performed at [facility], [place] [address], [place], [place] [address]    Culture (A)  Final    60,000 COLONIES/mL PANTOEA SPECIES Standardized susceptibility testing for this organism is not available. Performed at [facility], [place] [address] [place], [place] [address]    Report Status 03/19/2018 FINAL  Final    Please note: You were cared for by a hospitalist during your hospital stay. Once you are discharged, your primary care physician will handle any further medical issues. Please note that NO REFILLS for any discharge medications will be authorized once you are discharged, as it is imperative that you return to your primary care physician (or establish a relationship with a primary care physician if you do not have one) for your post hospital discharge needs so that they can reassess your need for medications and monitor your lab values.    Time coordinating discharge: 40 minutes  SIGNED:   Shelly Coss,  MD  [facility] 03/19/2018, 10:23 AM Pager [phone]  If 7PM-7AM, please contact night-coverage [email or web address] Password [email or web address]

## 2018-03-19 NOTE — Progress Notes (Addendum)
Copy provided of AVS instructions to pt at pt's request. Pt has no questions at this time. Report called to [facility] to Bufford Lope RN.

## 2018-03-19 NOTE — Plan of Care (Signed)
  Problem: Activity: Goal: Risk for activity intolerance will decrease Outcome: Progressing   Problem: Nutrition: Goal: Adequate nutrition will be maintained Outcome: Progressing   Problem: Elimination: Goal: Will not experience complications related to bowel motility Outcome: Progressing Goal: Will not experience complications related to urinary retention Outcome: Progressing   

## 2018-03-19 NOTE — Clinical Social Work Placement (Addendum)
Pt admitting to [facility] today room 806A- report [phone]. Pt's significant other Billy Fuller transporting him via private vehicle Will send DC information to facility via the [place]. [employer] medicare approved 7 days per Benjamine Mola.    CLINICAL SOCIAL WORK PLACEMENT  NOTE  Date:  03/19/2018  Patient Details  Name: Billy Fuller MRN: [id number] Date of Birth: 04-02-1946  Clinical Social Work is seeking post-discharge placement for this patient at the [place] level of care (*CSW will initial, date and re-position this form in  chart as items are completed):  Yes   Patient/family provided with [place] Work Department's list of facilities offering this level of care within the geographic area requested by the patient (or if unable, by the patient's family).  Yes   Patient/family informed of their freedom to choose among providers that offer the needed level of care, that participate in Medicare, Medicaid or managed care program needed by the patient, have an available bed and are willing to accept the patient.  Yes   Patient/family informed of [facility]'s ownership interest in [facility] and [facility], as well as of the fact that they are under no obligation to receive care at these facilities.  PASRR submitted to EDS on 03/18/18     PASRR number received on 03/18/18     Existing PASRR number confirmed on       FL2 transmitted to all facilities in geographic area requested by pt/family on 03/18/18     FL2 transmitted to all facilities within larger geographic area on       Patient informed that his/her managed care company has contracts with or will negotiate with certain facilities, including the following:        Yes   Patient/family informed of bed offers received.  Patient chooses bed at [facility]     Physician recommends and patient chooses bed at [facility]    Patient to be transferred to [facility] on  03/19/18.  Patient to be transferred to facility by family    Patient family notified on 03/19/18 of transfer.  Name of family member notified:  SO Billy Fuller     PHYSICIAN       Additional Comment:    _______________________________________________ Nila Nephew, LCSW 03/19/2018, 10:28 AM [phone]

## 2018-03-20 DIAGNOSIS — C61 Malignant neoplasm of prostate: Secondary | ICD-10-CM | POA: Diagnosis not present

## 2018-03-20 DIAGNOSIS — Z9889 Other specified postprocedural states: Secondary | ICD-10-CM | POA: Diagnosis not present

## 2018-03-20 DIAGNOSIS — R634 Abnormal weight loss: Secondary | ICD-10-CM | POA: Diagnosis not present

## 2018-03-20 DIAGNOSIS — R531 Weakness: Secondary | ICD-10-CM | POA: Diagnosis not present

## 2018-03-29 DIAGNOSIS — R531 Weakness: Secondary | ICD-10-CM | POA: Diagnosis not present

## 2018-03-29 DIAGNOSIS — D649 Anemia, unspecified: Secondary | ICD-10-CM | POA: Diagnosis not present

## 2018-04-04 ENCOUNTER — Other Ambulatory Visit: Payer: Self-pay

## 2018-04-04 DIAGNOSIS — E43 Unspecified severe protein-calorie malnutrition: Secondary | ICD-10-CM | POA: Diagnosis not present

## 2018-04-04 DIAGNOSIS — Z48816 Encounter for surgical aftercare following surgery on the genitourinary system: Secondary | ICD-10-CM | POA: Diagnosis not present

## 2018-04-04 DIAGNOSIS — Z436 Encounter for attention to other artificial openings of urinary tract: Secondary | ICD-10-CM | POA: Diagnosis not present

## 2018-04-04 NOTE — Patient Outreach (Signed)
[place] [facility]) Care Management  04/04/2018  Billy Fuller 1945/12/21 [id number]     Transition of Care Referral  Referral Date: 04/04/18 Referral Source: HTA Discharge Report Date of Admission: unknown Diagnosis: weakness, anemia Date of Discharge: 03/30/18 Facility: [place]: HTA    Outreach attempt # 1 to patient. No answer at present and voicemail not set up.    Plan: RN CM will make outreach attempt to patient within 3-4 business days. RN CM will send unsuccessful outreach letter to patient.    Enzo Montgomery, RN,BSN,CCM [place] Management Telephonic Care Management Coordinator Direct Phone: [phone] Toll Free: [phone] Fax: [phone]

## 2018-04-05 ENCOUNTER — Other Ambulatory Visit: Payer: Self-pay

## 2018-04-05 DIAGNOSIS — R8271 Bacteriuria: Secondary | ICD-10-CM | POA: Diagnosis not present

## 2018-04-05 NOTE — Patient Outreach (Signed)
[place] [facility]) Care Management  04/05/2018  Billy Fuller 02-Nov-1945 [id number]   Transition of Care Referral  Referral Date: 04/04/18 Referral Source: HTA Discharge Report Date of Admission: unknown Diagnosis: weakness, anemia Date of Discharge: 03/30/18 Facility: [place]: HTA   Outreach attempt #2 to patient. No answer at present and no voicemail.     Plan: RN CM will make outreach attempt to patient within 3-4 business days.  Enzo Montgomery, RN,BSN,CCM [place] Management Telephonic Care Management Coordinator Direct Phone: [phone] Toll Free: [phone] Fax: [phone]

## 2018-04-08 ENCOUNTER — Other Ambulatory Visit: Payer: Self-pay

## 2018-04-08 NOTE — Patient Outreach (Signed)
[place] [facility]) Care Management  04/08/2018  Billy Fuller Sep 02, 1945 [id number]   Transition of Care Referral  Referral Date:04/04/18 Referral Source:HTA Discharge Report Date of Admission:unknown Diagnosis:weakness, anemia Date of Discharge:03/30/18 Facility:[facility] Insurance:HTA   Outreach attempt #3 to patient. No answer at present and voicemail remains not set up.     Plan: RN CM will close case if no response from letter mailed to patient.    Enzo Montgomery, RN,BSN,CCM [place] [facility] Telephonic Care Management Coordinator Direct Phone: [phone] Toll Free: [phone] Fax: [phone]

## 2018-04-09 DIAGNOSIS — E43 Unspecified severe protein-calorie malnutrition: Secondary | ICD-10-CM | POA: Diagnosis not present

## 2018-04-09 DIAGNOSIS — Z436 Encounter for attention to other artificial openings of urinary tract: Secondary | ICD-10-CM | POA: Diagnosis not present

## 2018-04-09 DIAGNOSIS — Z48816 Encounter for surgical aftercare following surgery on the genitourinary system: Secondary | ICD-10-CM | POA: Diagnosis not present

## 2018-04-11 DIAGNOSIS — E43 Unspecified severe protein-calorie malnutrition: Secondary | ICD-10-CM | POA: Diagnosis not present

## 2018-04-11 DIAGNOSIS — Z48816 Encounter for surgical aftercare following surgery on the genitourinary system: Secondary | ICD-10-CM | POA: Diagnosis not present

## 2018-04-11 DIAGNOSIS — Z436 Encounter for attention to other artificial openings of urinary tract: Secondary | ICD-10-CM | POA: Diagnosis not present

## 2018-04-12 DIAGNOSIS — Z936 Other artificial openings of urinary tract status: Secondary | ICD-10-CM | POA: Diagnosis not present

## 2018-04-17 ENCOUNTER — Other Ambulatory Visit: Payer: Self-pay

## 2018-04-17 NOTE — Patient Outreach (Signed)
[place] [facility]) Care Management  04/17/2018  Billy Fuller September 19, 1945 [id number]   Transition of Care Referral  Referral Date:04/04/18 Referral Source:HTA Discharge Report Date of Admission:unknown Diagnosis:weakness, anemia Date of Discharge:03/30/18 Facility:[facility] Insurance:HTA     Multiple attempts to establish contact with patient without success. No response from letter mailed to patient. Case is being closed at this time.     Plan: RN CM will close case at this time.  Enzo Montgomery, RN,BSN,CCM [place] Management Telephonic Care Management Coordinator Direct Phone: [phone] Toll Free: [phone] Fax: [phone]

## 2018-04-18 DIAGNOSIS — D649 Anemia, unspecified: Secondary | ICD-10-CM | POA: Diagnosis not present

## 2018-04-18 DIAGNOSIS — E46 Unspecified protein-calorie malnutrition: Secondary | ICD-10-CM | POA: Diagnosis not present

## 2018-04-18 DIAGNOSIS — G8194 Hemiplegia, unspecified affecting left nondominant side: Secondary | ICD-10-CM | POA: Diagnosis not present

## 2018-04-18 DIAGNOSIS — Z936 Other artificial openings of urinary tract status: Secondary | ICD-10-CM | POA: Diagnosis not present

## 2018-04-18 DIAGNOSIS — I639 Cerebral infarction, unspecified: Secondary | ICD-10-CM | POA: Diagnosis not present

## 2018-04-18 DIAGNOSIS — C61 Malignant neoplasm of prostate: Secondary | ICD-10-CM | POA: Diagnosis not present

## 2018-04-18 DIAGNOSIS — Z79899 Other long term (current) drug therapy: Secondary | ICD-10-CM | POA: Diagnosis not present

## 2018-04-18 DIAGNOSIS — R634 Abnormal weight loss: Secondary | ICD-10-CM | POA: Diagnosis not present

## 2018-04-18 DIAGNOSIS — E785 Hyperlipidemia, unspecified: Secondary | ICD-10-CM | POA: Diagnosis not present

## 2018-04-18 DIAGNOSIS — F321 Major depressive disorder, single episode, moderate: Secondary | ICD-10-CM | POA: Diagnosis not present

## 2018-04-23 DIAGNOSIS — Z936 Other artificial openings of urinary tract status: Secondary | ICD-10-CM | POA: Diagnosis not present

## 2018-05-14 DIAGNOSIS — Z936 Other artificial openings of urinary tract status: Secondary | ICD-10-CM | POA: Diagnosis not present

## 2018-05-23 ENCOUNTER — Inpatient Hospital Stay: Payer: PPO | Attending: Oncology | Admitting: Oncology

## 2018-05-23 ENCOUNTER — Telehealth: Payer: Self-pay | Admitting: Oncology

## 2018-05-23 VITALS — BP 118/66 | HR 81 | Temp 97.6°F | Resp 18 | Ht 65.0 in | Wt 144.4 lb

## 2018-05-23 DIAGNOSIS — Z923 Personal history of irradiation: Secondary | ICD-10-CM | POA: Diagnosis not present

## 2018-05-23 DIAGNOSIS — Z79899 Other long term (current) drug therapy: Secondary | ICD-10-CM | POA: Diagnosis not present

## 2018-05-23 DIAGNOSIS — C61 Malignant neoplasm of prostate: Secondary | ICD-10-CM

## 2018-05-23 NOTE — Progress Notes (Signed)
Hematology and Oncology Follow Up Visit  Billy Fuller [id number] [birth year] 72 y.o. 05/23/2018 9:36 AM Billy Fuller, MDWolters, Billy Booty, MD   Principle Diagnosis: 72 year old man with advanced prostate cancer with pelvic adenopathy documented in 2018.  His disease is currently castration-sensitive. He was initially diagnosed in 2011 with Gleason score was 3+4 = 7.   Prior Therapy:  Status post radical prostatectomy in 2011. He received adjuvant radiation therapy at that time.   He developed biochemical recurrence with a PSA up to 13 and 2014. He was treated as a part of a clinical trial with ADT and Zytiga. His PSA remained undetectable for the last 3 years.  Last hormone treatment was in 2015.   His PSA on 03/28/2017 was 5.86. In July 2018 his PSA was 2.59. He underwent PET scan on 03/13/2017 which showed low to moderate radiotracer activity within the right external iliac lymph node and 2 adjacent left periaortic lymph node.   Firmagon under the care of Billy Fuller started in January 2019.  He is status post cystectomy and lymphadenectomy completed by Billy Fuller in September 2019.  The final pathology showed no evidence of malignancy in the bladder with lymph node involvement with adenocarcinoma of the prostate.  Current therapy: Active surveillance.    Interim History: Billy Fuller resents today for a follow-up.  Since her last visit, he underwent robotic assisted laparoscopic cystectomy and lymphadenectomy on 03/06/2018.  He tolerated the procedure well but was hospitalized again for hematuria and failure to thrive.  Since last discharge, he has felt really to me well and fully recovered at this time.  He is eating better and his weight has improved.  He denies any pelvic discomfort or hematuria.  His urostomy is functioning properly.   He does not report any headaches, blurry vision, syncope or seizures.  He denies any alteration in mental status or confusion.  He does not report any  fevers, chills or sweats. He does not report any cough, wheezing or hemoptysis. He does not report any nausea, vomiting or abdominal pain.  Denies any changes in bowel habits.  He does not report any frequency urgency or hesitancy. He does not report any pain or pathological fractures. He does not report any bleeding or clotting tendency.  Denies any heat or cold intolerance.  Denies any lymphadenopathy or or petechiae. Remaining review of systems is negative.    Medications: I have reviewed the patient's current medications.  Current Outpatient Medications  Medication Sig Dispense Refill  . atorvastatin (LIPITOR) 40 MG tablet Take 1 tablet (40 mg total) by mouth daily at 6 PM. 30 tablet 0  . feeding supplement, ENSURE ENLIVE, (ENSURE ENLIVE) LIQD Take 237 mLs by mouth 2 (two) times daily between meals. 237 mL 12  . megestrol (MEGACE) 40 MG/ML suspension Take 10 mLs (400 mg total) by mouth 2 (two) times daily. 240 mL 0  . Multiple Vitamin (MULTIVITAMIN WITH MINERALS) TABS tablet Take 1 tablet by mouth daily.    Marland Kitchen oxyCODONE-acetaminophen (PERCOCET) 5-325 MG tablet Take 1 tablet by mouth every 4 (four) hours as needed for severe pain. 20 tablet 0  . polyethylene glycol (MIRALAX / GLYCOLAX) packet Take 17 g by mouth daily. 14 each 0  . senna-docusate (SENOKOT-S) 8.6-50 MG tablet Take 1 tablet by mouth 2 (two) times daily. While taking strong pain meds to prevent constipation. 20 tablet 0  . senna-docusate (SENOKOT-S) 8.6-50 MG tablet Take 1 tablet by mouth at bedtime as needed for mild constipation.  No current facility-administered medications for this visit.      Allergies: No Known Allergies  Past Medical History, Surgical history, Social history, and Family History were reviewed and updated.  Marland Kitchen Physical Exam:  Blood pressure 118/66, pulse 81, temperature 97.6 F (36.4 C), temperature source Oral, resp. rate 18, height 5\' 5"  (1.651 m), weight 144 lb 6.4 oz (65.5 kg), SpO2 100  %.   ECOG: 0    General appearance: Comfortable appearing without any discomfort Head: Normocephalic without any trauma Oropharynx: Mucous membranes are moist and pink without any thrush or ulcers. Eyes: Pupils are equal and round reactive to light. Lymph nodes: No cervical, supraclavicular, inguinal or axillary lymphadenopathy.   Heart:regular rate and rhythm.  S1 and S2 without leg edema. Lung: Clear without any rhonchi or wheezes.  No dullness to percussion. Abdomin: Soft, nontender, nondistended with good bowel sounds.  No hepatosplenomegaly. Musculoskeletal: No joint deformity or effusion.  Full range of motion noted. Neurological: No deficits noted on motor, sensory and deep tendon reflex exam. Skin: No petechial rash or dryness.  Appeared moist.  Psychiatric: Mood and affect appeared appropriate.   Lab Results: Lab Results  Component Value Date   WBC 6.0 03/18/2018   HGB 8.6 (L) 03/18/2018   HCT 26.4 (L) 03/18/2018   MCV 84.9 03/18/2018   PLT 282 03/18/2018     Chemistry      Component Value Date/Time   NA 139 03/17/2018 0429   K 3.6 03/17/2018 0429   CL 108 03/17/2018 0429   CO2 23 03/17/2018 0429   BUN 17 03/17/2018 0429   CREATININE 0.70 03/17/2018 0429      Component Value Date/Time   CALCIUM 8.0 (L) 03/17/2018 0429   ALKPHOS 53 05/31/2016 0454   AST 21 05/31/2016 0454   ALT 15 (L) 05/31/2016 0454   BILITOT 0.6 05/31/2016 0454        Impression and Plan:   72 year old man with:  1.  Advanced prostate cancer that is currently castration-sensitive with androgen deprivation only.  He has declined additional therapy in the past and has done reasonably well on Firmagon alone.  He underwent cystectomy and lymphadenectomy which showed prostate cancer involvement in his pelvic lymph nodes.    The natural course of this disease was discussed again additional therapy was reiterated include form of Taxotere, Zytiga among others.  These options could be  introduced now but he opted to defer these options unless he developed castration resistant disease.  He is scheduled to have a repeat imaging studies in the near future and I recommended restarting systemic therapy if he has measurable disease in the future.  2.  Androgen deprivation: This has been on hold after his surgery.  I would recommend restarting it if he is measurable disease in the future.  3.  Hemorrhagic cystitis: Resolved at this time.  4.  Follow-up: We will be in X months to follow his progress.  15  minutes was spent with the patient face-to-face today.  More than 50% of time was dedicated to discussing his disease status, treatment options and complications related to therapy.   Zola Button, MD 11/21/20199:36 AM

## 2018-05-23 NOTE — Telephone Encounter (Signed)
Gave pt avs and calendar  °

## 2018-05-28 ENCOUNTER — Other Ambulatory Visit: Payer: Self-pay

## 2018-05-28 ENCOUNTER — Encounter (HOSPITAL_COMMUNITY): Payer: Self-pay | Admitting: Emergency Medicine

## 2018-05-28 ENCOUNTER — Emergency Department (HOSPITAL_COMMUNITY): Payer: PPO

## 2018-05-28 ENCOUNTER — Emergency Department (HOSPITAL_COMMUNITY)
Admission: EM | Admit: 2018-05-28 | Discharge: 2018-05-29 | Disposition: A | Discharge status: Home / Self Care | Payer: PPO | Attending: Emergency Medicine | Admitting: Emergency Medicine

## 2018-05-28 DIAGNOSIS — R1084 Generalized abdominal pain: Secondary | ICD-10-CM | POA: Diagnosis not present

## 2018-05-28 DIAGNOSIS — Z87891 Personal history of nicotine dependence: Secondary | ICD-10-CM | POA: Insufficient documentation

## 2018-05-28 DIAGNOSIS — Z79899 Other long term (current) drug therapy: Secondary | ICD-10-CM | POA: Insufficient documentation

## 2018-05-28 DIAGNOSIS — Z8551 Personal history of malignant neoplasm of bladder: Secondary | ICD-10-CM | POA: Diagnosis not present

## 2018-05-28 DIAGNOSIS — K7689 Other specified diseases of liver: Secondary | ICD-10-CM | POA: Insufficient documentation

## 2018-05-28 DIAGNOSIS — R1032 Left lower quadrant pain: Secondary | ICD-10-CM | POA: Diagnosis not present

## 2018-05-28 LAB — COMPREHENSIVE METABOLIC PANEL
ALT: 37 U/L (ref 0–44)
AST: 34 U/L (ref 15–41)
Albumin: 3.5 g/dL (ref 3.5–5.0)
Alkaline Phosphatase: 48 U/L (ref 38–126)
Anion gap: 7 (ref 5–15)
BUN: 21 mg/dL (ref 8–23)
CALCIUM: 8.7 mg/dL — AB (ref 8.9–10.3)
CO2: 23 mmol/L (ref 22–32)
CREATININE: 1.11 mg/dL (ref 0.61–1.24)
Chloride: 110 mmol/L (ref 98–111)
GFR calc non Af Amer: >60 mL/min (ref >60)
GLUCOSE: 168 mg/dL — AB (ref 70–99)
Potassium: 4 mmol/L (ref 3.5–5.1)
SODIUM: 140 mmol/L (ref 135–145)
Total Bilirubin: 0.5 mg/dL (ref 0.3–1.2)
Total Protein: 6.7 g/dL (ref 6.5–8.1)

## 2018-05-28 LAB — CBC
HCT: 32 % — ABNORMAL LOW (ref 39.0–52.0)
Hemoglobin: 9.6 g/dL — ABNORMAL LOW (ref 13.0–17.0)
MCH: 23.3 pg — AB (ref 26.0–34.0)
MCHC: 30 g/dL (ref 30.0–36.0)
MCV: 77.7 fL — AB (ref 80.0–100.0)
NRBC: 0 % (ref 0.0–0.2)
PLATELETS: 272 10*3/uL (ref 150–400)
RBC: 4.12 MIL/uL — ABNORMAL LOW (ref 4.22–5.81)
RDW: 18.7 % — AB (ref 11.5–15.5)
WBC: 8.1 10*3/uL (ref 4.0–10.5)

## 2018-05-28 LAB — URINALYSIS, ROUTINE W REFLEX MICROSCOPIC
Bilirubin Urine: NEGATIVE
GLUCOSE, UA: NEGATIVE mg/dL
Ketones, ur: NEGATIVE mg/dL
Nitrite: POSITIVE — AB
Protein, ur: NEGATIVE mg/dL
SPECIFIC GRAVITY, URINE: 1.013 (ref 1.005–1.030)
pH: 6 (ref 5.0–8.0)

## 2018-05-28 LAB — LIPASE, BLOOD: Lipase: 37 U/L (ref 11–51)

## 2018-05-28 MED ORDER — SODIUM CHLORIDE (PF) 0.9 % IJ SOLN
INTRAMUSCULAR | Status: AC
  Filled 2018-05-28: qty 50

## 2018-05-28 MED ORDER — IOPAMIDOL (ISOVUE-300) INJECTION 61%
INTRAVENOUS | Status: AC
  Filled 2018-05-28: qty 100

## 2018-05-28 MED ORDER — SODIUM CHLORIDE 0.9 % IV SOLN
1000.0000 mL | INTRAVENOUS | Status: DC
  Administered 2018-05-28: 1000 mL via INTRAVENOUS

## 2018-05-28 MED ORDER — IOPAMIDOL (ISOVUE-300) INJECTION 61%
100.0000 mL | Freq: Once | INTRAVENOUS | Status: AC | PRN
  Administered 2018-05-28: 100 mL via INTRAVENOUS

## 2018-05-28 MED ORDER — SODIUM CHLORIDE 0.9 % IV BOLUS (SEPSIS)
500.0000 mL | Freq: Once | INTRAVENOUS | Status: AC
  Administered 2018-05-28: 500 mL via INTRAVENOUS

## 2018-05-28 NOTE — ED Provider Notes (Signed)
[facility] DEPT Provider Note   CSN: [id number] Arrival date & time: 05/28/18  2045     History   Chief Complaint Chief Complaint  Patient presents with  . Abdominal Pain    HPI Billy Fuller is a 72 y.o. male.  Pt has history of prior bladder ca.  Had cystectomy  The history is provided by the patient.  Abdominal Pain   This is a new problem. Episode onset: about 3 pm. The pain is located in the LLQ. The pain is severe. Pertinent negatives include anorexia, fever, diarrhea, nausea, vomiting, constipation and dysuria. Nothing aggravates the symptoms. Relieved by: pt took a pain pill and now the pain is improving.    Past Medical History:  Diagnosis Date  . Cataract    eye implants  . ED (erectile dysfunction)   . Nephrolithiasis   . Prostate CA ([place]) 08/06/09   recurrent  . Stroke [facility])     Patient Active Problem List   Diagnosis Date Noted  . Acute lower UTI 03/17/2018  . History of CVA (cerebrovascular accident) 03/17/2018  . Weakness 03/17/2018  . Acute blood loss anemia 03/17/2018  . Dehydration 03/16/2018  . Protein-calorie malnutrition, severe 03/09/2018  . S/P ileal conduit ([place]) 03/06/2018  . Chronic radiation cystitis 03/06/2018  . Gross hematuria 12/11/2017  . Radiation cystitis 09/04/2017  . Acute CVA (cerebrovascular accident) ([place]) 05/31/2016  . Cerebrovascular accident (CVA) ([place])   . Left-sided weakness 05/30/2016  . TIA (transient ischemic attack) 05/30/2016  . Impacted cerumen of right ear 05/01/2016  . Prostate cancer ([place]) 05/16/2011  . Prostate CA ([place]) 08/06/2009    Past Surgical History:  Procedure Laterality Date  . CYSTOSCOPY WITH FULGERATION N/A 09/04/2017   Procedure: [place] AND CLOT EVACUATION;  Surgeon: Alexis Frock, MD;  Location: [facility];  Service: Urology;  Laterality: N/A;  . CYSTOSCOPY WITH FULGERATION N/A 12/13/2017   Procedure: CYSTOSCOPY WITH FULGERATION OF BLEEDERS;   Surgeon: Irine Seal, MD;  Location: [facility];  Service: Urology;  Laterality: N/A;  . LYMPHADENECTOMY Bilateral 03/06/2018   Procedure: LYMPHADENECTOMY;  Surgeon: Alexis Frock, MD;  Location: [facility];  Service: Urology;  Laterality: Bilateral;  . ROBOT ASSISTED LAPAROSCOPIC COMPLETE CYSTECT ILEAL CONDUIT N/A 03/06/2018   Procedure: XI ROBOTIC ASSISTED LAPAROSCOPIC COMPLETE CYSTECT ILEAL CONDUIT;  Surgeon: Alexis Frock, MD;  Location: [facility];  Service: Urology;  Laterality: N/A;  . ROBOT ASSISTED LAPAROSCOPIC RADICAL PROSTATECTOMY     10/06/09        Home Medications    Prior to Admission medications   Medication Sig Start Date End Date Taking? Authorizing Provider  atorvastatin (LIPITOR) 40 MG tablet Take 1 tablet (40 mg total) by mouth daily at 6 PM. 06/01/16  Yes Katheren Shams, DO  megestrol (MEGACE ES) 625 MG/5ML suspension Take 625 mg by mouth daily.   Yes [provider]  venlafaxine XR (EFFEXOR-XR) 37.5 MG 24 hr capsule Take 37.5 mg by mouth daily with breakfast.  04/18/18  Yes [provider]  megestrol (MEGACE) 40 MG/ML suspension Take 10 mLs (400 mg total) by mouth 2 (two) times daily. Patient not taking: Reported on 05/28/2018 03/19/18   Shelly Coss, MD    Family History Family History  Problem Relation Age of Onset  . Cancer Mother        breast  . Kidney failure Father   . Prostate cancer Father     Social History Social History   Tobacco Use  .  Smoking status: Former Smoker    Last attempt to quit: 05/31/1979    Years since quitting: 39.0  . Smokeless tobacco: Never Used  Substance Use Topics  . Alcohol use: Yes    Comment: 4 alcoholic beverages/week  . Drug use: No     Allergies   Patient has no known allergies.   Review of Systems Review of Systems  Constitutional: Negative for fever.  Gastrointestinal: Positive for abdominal pain. Negative for anorexia, constipation, diarrhea, nausea and vomiting.  Genitourinary: Negative for  dysuria.  All other systems reviewed and are negative.    Physical Exam Updated Vital Signs BP 134/77 (BP Location: Right Arm)   Pulse 77   Temp 97.6 F (36.4 C) (Oral)   Resp 18   Ht 1.651 m (5\' 5" )   Wt 65.5 kg   SpO2 98%   BMI 24.03 kg/m   Physical Exam  Constitutional: He appears well-developed and well-nourished. No distress.  HENT:  Head: Normocephalic and atraumatic.  Right Ear: External ear normal.  Left Ear: External ear normal.  Eyes: Conjunctivae are normal. Right eye exhibits no discharge. Left eye exhibits no discharge. No scleral icterus.  Neck: Neck supple. No tracheal deviation present.  Cardiovascular: Normal rate, regular rhythm and intact distal pulses.  Pulmonary/Chest: Effort normal and breath sounds normal. No stridor. No respiratory distress. He has no wheezes. He has no rales.  Abdominal: Soft. Bowel sounds are normal. He exhibits no distension. There is tenderness in the left upper quadrant. There is no rebound and no guarding.  S/p urostomy  Musculoskeletal: He exhibits no edema or tenderness.  Neurological: He is alert. He has normal strength. No cranial nerve deficit (no facial droop, extraocular movements intact, no slurred speech) or sensory deficit. He exhibits normal muscle tone. He displays no seizure activity. Coordination normal.  Skin: Skin is warm and dry. No rash noted.  Psychiatric: He has a normal mood and affect.  Nursing note and vitals reviewed.    ED Treatments / Results  Labs (all labs ordered are listed, but only abnormal results are displayed) Labs Reviewed  COMPREHENSIVE METABOLIC PANEL - Abnormal; Notable for the following components:      Result Value   Glucose, Bld 168 (*)    Calcium 8.7 (*)    All other components within normal limits  CBC - Abnormal; Notable for the following components:   RBC 4.12 (*)    Hemoglobin 9.6 (*)    HCT 32.0 (*)    MCV 77.7 (*)    MCH 23.3 (*)    RDW 18.7 (*)    All other components  within normal limits  URINALYSIS, ROUTINE W REFLEX MICROSCOPIC - Abnormal; Notable for the following components:   APPearance HAZY (*)    Hgb urine dipstick SMALL (*)    Nitrite POSITIVE (*)    Leukocytes, UA SMALL (*)    Bacteria, UA FEW (*)    All other components within normal limits  URINE CULTURE  LIPASE, BLOOD    EKG None  Radiology No results found.  Procedures Procedures (including critical care time)  Medications Ordered in ED Medications  sodium chloride 0.9 % bolus 500 mL (0 mLs Intravenous Stopped 05/28/18 2308)    Followed by  0.9 %  sodium chloride infusion (1,000 mLs Intravenous New Bag/Given 05/28/18 2222)  iopamidol (ISOVUE-300) 61 % injection (has no administration in time range)  sodium chloride (PF) 0.9 % injection (has no administration in time range)  iopamidol (ISOVUE-300) 61 %  injection 100 mL (100 mLs Intravenous Contrast Given 05/28/18 2339)     Initial Impression / Assessment and Plan / ED Course  I have reviewed the triage vital signs and the nursing notes.  Pertinent labs & imaging results that were available during my care of the patient were reviewed by me and considered in my medical decision making (see chart for details).  Clinical Course as of Nov 27 0001  Tue May 28, 2018  2332 Ua is abnormal however from a urostomy.  Not clearly a infection   [JK]  Wed May 29, 2018  0000 Hemoglobin is stable  CBC(!) [JK]    Clinical Course User Index [JK] Dorie Rank, MD   Patient presented to the emergency room with complaints of severe abdominal pain.  His symptoms were slowly improving.  Patient has a complex medical history including prior abdominal surgeries.  Symptoms are concerning for the possibility of a bowel obstruction.  CT scan ordered for further evaluation.  Dr. Christy Gentles will follow up on the results  Final Clinical Impressions(s) / ED Diagnoses  pending   Dorie Rank, MD 05/29/18 0006

## 2018-05-28 NOTE — ED Triage Notes (Signed)
Pt arriving with lower abdominal pain. Pt has bladder removed approx 1.5 months ago. Pt has urostomy

## 2018-05-28 NOTE — ED Notes (Signed)
Urine culture sent down to lab with urine sample 

## 2018-05-29 ENCOUNTER — Telehealth: Payer: Self-pay

## 2018-05-29 ENCOUNTER — Other Ambulatory Visit: Payer: Self-pay | Admitting: Oncology

## 2018-05-29 DIAGNOSIS — R1084 Generalized abdominal pain: Secondary | ICD-10-CM | POA: Diagnosis not present

## 2018-05-29 DIAGNOSIS — C61 Malignant neoplasm of prostate: Secondary | ICD-10-CM

## 2018-05-29 NOTE — ED Provider Notes (Signed)
I assumed care in sign out to follow-up on CT imaging.  CT scan does not reveal acute abdominal emergency, does reveal signs of enteritis.  Patient reports he is feeling improved, he has no pain, no focal tenderness.  He is not vomiting.  He wishes to be discharged.  I did advise him that he may develop diarrhea, and CT imaging does reveal possible enteritis.  CT scan does not reveal any obvious signs of perforation or abscess.  Does have liver density, advised to follow-up with this.  He reports he is actually supposed to get a PET scan next few months per urology.   Ripley Fraise, MD 05/29/18 [phone]

## 2018-05-29 NOTE — Discharge Instructions (Addendum)
As we discussed, please follow-up for further evaluation of the spot/nodule in your liver.  SEEK IMMEDIATE MEDICAL ATTENTION IF: The pain does not go away or becomes severe, particularly over the next 8-12 hours.  A temperature above 100.61F develops.  Repeated vomiting occurs (multiple episodes).     Blood is being passed in stools or vomit (bright red or black tarry stools).  Return also if you develop chest pain, difficulty breathing, dizziness or fainting, or become confused, poorly responsive, or inconsolable.

## 2018-05-29 NOTE — Telephone Encounter (Signed)
Received a call from patient spouse stating that the patient was treated in the ED yesterday for severe abdominal pain and wanted to make Dr. Alen Blew aware of the CT scan results. MD made aware.

## 2018-05-31 LAB — URINE CULTURE: Culture: >=100000 — AB

## 2018-06-01 ENCOUNTER — Telehealth: Payer: Self-pay

## 2018-06-01 NOTE — Telephone Encounter (Signed)
No treatment for [facility] ED 05/29/18 per [facility]

## 2018-06-12 ENCOUNTER — Telehealth: Payer: Self-pay

## 2018-06-12 NOTE — Telephone Encounter (Signed)
Received a call from patient significant other Terri stating that an MRI was supposed to be ordered after the last MD visit but they have not heard anything as far as scheduling. Provided the number for central scheduling and explained that they can call to schedule the MRI. No other questions or concerns.

## 2018-06-19 ENCOUNTER — Ambulatory Visit (HOSPITAL_COMMUNITY)
Admission: RE | Admit: 2018-06-19 | Discharge: 2018-06-19 | Disposition: A | Discharge status: Home / Self Care | Payer: PPO | Source: Ambulatory Visit | Attending: Oncology | Admitting: Oncology

## 2018-06-19 DIAGNOSIS — K769 Liver disease, unspecified: Secondary | ICD-10-CM | POA: Diagnosis not present

## 2018-06-19 DIAGNOSIS — C61 Malignant neoplasm of prostate: Secondary | ICD-10-CM | POA: Insufficient documentation

## 2018-06-19 MED ORDER — GADOBUTROL 1 MMOL/ML IV SOLN
6.0000 mL | Freq: Once | INTRAVENOUS | Status: AC | PRN
  Administered 2018-06-19: 6 mL via INTRAVENOUS

## 2018-06-20 ENCOUNTER — Telehealth: Payer: Self-pay | Admitting: *Deleted

## 2018-06-20 NOTE — Telephone Encounter (Signed)
Spoke with patient, per dr Alen Blew, MRI is normal and remind patient to keep f/u appt in may 2020

## 2018-06-20 NOTE — Telephone Encounter (Signed)
-----   Message from Wyatt Portela, MD sent at 06/20/2018  8:33 AM EST ----- Please let him know his MRI is normal. Nothing else needs to be done. Keep follow up next may as is.

## 2018-07-22 DIAGNOSIS — Z79899 Other long term (current) drug therapy: Secondary | ICD-10-CM | POA: Diagnosis not present

## 2018-07-22 DIAGNOSIS — G4730 Sleep apnea, unspecified: Secondary | ICD-10-CM | POA: Diagnosis not present

## 2018-07-22 DIAGNOSIS — I700 Atherosclerosis of aorta: Secondary | ICD-10-CM | POA: Diagnosis not present

## 2018-07-22 DIAGNOSIS — C61 Malignant neoplasm of prostate: Secondary | ICD-10-CM | POA: Diagnosis not present

## 2018-07-22 DIAGNOSIS — F321 Major depressive disorder, single episode, moderate: Secondary | ICD-10-CM | POA: Diagnosis not present

## 2018-07-22 DIAGNOSIS — D649 Anemia, unspecified: Secondary | ICD-10-CM | POA: Diagnosis not present

## 2018-07-22 DIAGNOSIS — E46 Unspecified protein-calorie malnutrition: Secondary | ICD-10-CM | POA: Diagnosis not present

## 2018-07-22 DIAGNOSIS — R5383 Other fatigue: Secondary | ICD-10-CM | POA: Diagnosis not present

## 2018-08-13 DIAGNOSIS — Z936 Other artificial openings of urinary tract status: Secondary | ICD-10-CM | POA: Diagnosis not present

## 2018-09-12 ENCOUNTER — Other Ambulatory Visit: Payer: Self-pay

## 2018-09-12 ENCOUNTER — Ambulatory Visit (HOSPITAL_COMMUNITY)
Admission: RE | Admit: 2018-09-12 | Discharge: 2018-09-12 | Disposition: A | Discharge status: Home / Self Care | Payer: PPO | Source: Ambulatory Visit | Attending: Urology | Admitting: Urology

## 2018-09-12 ENCOUNTER — Other Ambulatory Visit (HOSPITAL_COMMUNITY): Payer: Self-pay | Admitting: Urology

## 2018-09-12 DIAGNOSIS — C61 Malignant neoplasm of prostate: Secondary | ICD-10-CM | POA: Diagnosis not present

## 2018-09-12 DIAGNOSIS — C679 Malignant neoplasm of bladder, unspecified: Secondary | ICD-10-CM | POA: Diagnosis not present

## 2018-09-23 DIAGNOSIS — C61 Malignant neoplasm of prostate: Secondary | ICD-10-CM | POA: Diagnosis not present

## 2018-09-30 DIAGNOSIS — Z936 Other artificial openings of urinary tract status: Secondary | ICD-10-CM | POA: Diagnosis not present

## 2018-10-25 ENCOUNTER — Telehealth: Payer: Self-pay | Admitting: Oncology

## 2018-10-25 NOTE — Telephone Encounter (Signed)
R/s appt per 4/23 sch message - unable to reach patient  Left message with appt date and time

## 2018-11-04 DIAGNOSIS — Z936 Other artificial openings of urinary tract status: Secondary | ICD-10-CM | POA: Diagnosis not present

## 2018-11-21 ENCOUNTER — Ambulatory Visit: Payer: PPO | Admitting: Oncology

## 2018-12-06 DIAGNOSIS — Z936 Other artificial openings of urinary tract status: Secondary | ICD-10-CM | POA: Diagnosis not present

## 2019-01-30 DIAGNOSIS — Z936 Other artificial openings of urinary tract status: Secondary | ICD-10-CM | POA: Diagnosis not present

## 2019-02-21 ENCOUNTER — Inpatient Hospital Stay: Payer: PPO | Attending: Oncology | Admitting: Oncology

## 2019-03-20 DIAGNOSIS — C61 Malignant neoplasm of prostate: Secondary | ICD-10-CM | POA: Diagnosis not present

## 2019-03-24 DIAGNOSIS — C61 Malignant neoplasm of prostate: Secondary | ICD-10-CM | POA: Diagnosis not present

## 2019-03-31 DIAGNOSIS — C775 Secondary and unspecified malignant neoplasm of intrapelvic lymph nodes: Secondary | ICD-10-CM | POA: Diagnosis not present

## 2019-03-31 DIAGNOSIS — C61 Malignant neoplasm of prostate: Secondary | ICD-10-CM | POA: Diagnosis not present

## 2019-03-31 DIAGNOSIS — Z936 Other artificial openings of urinary tract status: Secondary | ICD-10-CM | POA: Diagnosis not present

## 2019-03-31 DIAGNOSIS — N3041 Irradiation cystitis with hematuria: Secondary | ICD-10-CM | POA: Diagnosis not present

## 2019-04-09 DIAGNOSIS — Z20828 Contact with and (suspected) exposure to other viral communicable diseases: Secondary | ICD-10-CM | POA: Diagnosis not present

## 2019-04-15 DIAGNOSIS — E785 Hyperlipidemia, unspecified: Secondary | ICD-10-CM | POA: Diagnosis not present

## 2019-04-15 DIAGNOSIS — Z79899 Other long term (current) drug therapy: Secondary | ICD-10-CM | POA: Diagnosis not present

## 2019-04-15 DIAGNOSIS — Z0000 Encounter for general adult medical examination without abnormal findings: Secondary | ICD-10-CM | POA: Diagnosis not present

## 2019-04-15 DIAGNOSIS — Z23 Encounter for immunization: Secondary | ICD-10-CM | POA: Diagnosis not present

## 2019-04-15 DIAGNOSIS — F325 Major depressive disorder, single episode, in full remission: Secondary | ICD-10-CM | POA: Diagnosis not present

## 2019-04-15 DIAGNOSIS — D649 Anemia, unspecified: Secondary | ICD-10-CM | POA: Diagnosis not present

## 2019-04-15 DIAGNOSIS — G8194 Hemiplegia, unspecified affecting left nondominant side: Secondary | ICD-10-CM | POA: Diagnosis not present

## 2019-04-15 DIAGNOSIS — Z1159 Encounter for screening for other viral diseases: Secondary | ICD-10-CM | POA: Diagnosis not present

## 2019-04-15 DIAGNOSIS — I639 Cerebral infarction, unspecified: Secondary | ICD-10-CM | POA: Diagnosis not present

## 2019-06-06 DIAGNOSIS — Z936 Other artificial openings of urinary tract status: Secondary | ICD-10-CM | POA: Diagnosis not present

## 2019-07-08 DIAGNOSIS — M65311 Trigger thumb, right thumb: Secondary | ICD-10-CM | POA: Diagnosis not present

## 2019-07-08 DIAGNOSIS — M79641 Pain in right hand: Secondary | ICD-10-CM | POA: Diagnosis not present

## 2019-07-08 DIAGNOSIS — M65341 Trigger finger, right ring finger: Secondary | ICD-10-CM | POA: Diagnosis not present

## 2019-07-08 DIAGNOSIS — M65331 Trigger finger, right middle finger: Secondary | ICD-10-CM | POA: Diagnosis not present

## 2019-07-31 DIAGNOSIS — M65311 Trigger thumb, right thumb: Secondary | ICD-10-CM | POA: Diagnosis not present

## 2019-07-31 DIAGNOSIS — M79641 Pain in right hand: Secondary | ICD-10-CM | POA: Diagnosis not present

## 2019-07-31 DIAGNOSIS — M65341 Trigger finger, right ring finger: Secondary | ICD-10-CM | POA: Diagnosis not present

## 2019-07-31 DIAGNOSIS — M65331 Trigger finger, right middle finger: Secondary | ICD-10-CM | POA: Diagnosis not present

## 2019-08-18 DIAGNOSIS — Z436 Encounter for attention to other artificial openings of urinary tract: Secondary | ICD-10-CM | POA: Diagnosis not present

## 2019-09-25 ENCOUNTER — Ambulatory Visit (HOSPITAL_COMMUNITY)
Admission: RE | Admit: 2019-09-25 | Discharge: 2019-09-25 | Disposition: A | Discharge status: Home / Self Care | Payer: PPO | Source: Ambulatory Visit | Attending: Urology | Admitting: Urology

## 2019-09-25 ENCOUNTER — Other Ambulatory Visit (HOSPITAL_COMMUNITY): Payer: Self-pay | Admitting: Urology

## 2019-09-25 ENCOUNTER — Other Ambulatory Visit: Payer: Self-pay

## 2019-09-25 DIAGNOSIS — C61 Malignant neoplasm of prostate: Secondary | ICD-10-CM | POA: Insufficient documentation

## 2019-09-25 DIAGNOSIS — Z8546 Personal history of malignant neoplasm of prostate: Secondary | ICD-10-CM | POA: Diagnosis not present

## 2019-10-01 ENCOUNTER — Encounter (INDEPENDENT_AMBULATORY_CARE_PROVIDER_SITE_OTHER): Payer: Self-pay | Admitting: Otolaryngology

## 2019-10-01 ENCOUNTER — Ambulatory Visit (INDEPENDENT_AMBULATORY_CARE_PROVIDER_SITE_OTHER): Payer: PPO | Admitting: Otolaryngology

## 2019-10-01 ENCOUNTER — Other Ambulatory Visit: Payer: Self-pay

## 2019-10-01 VITALS — Temp 97.5°F

## 2019-10-01 DIAGNOSIS — H6123 Impacted cerumen, bilateral: Secondary | ICD-10-CM | POA: Diagnosis not present

## 2019-10-01 NOTE — Progress Notes (Signed)
HPI: Billy Fuller is a 73 y.o. male who presents for evaluation of wax buildup referred by [facility].  He wears bilateral hearing aids.  No ear pain or discomfort or drainage..  Past Medical History:  Diagnosis Date  . Cataract    eye implants  . ED (erectile dysfunction)   . Nephrolithiasis   . Prostate CA ([place]) 08/06/09   recurrent  . Stroke [facility])    Past Surgical History:  Procedure Laterality Date  . CYSTOSCOPY WITH FULGERATION N/A 09/04/2017   Procedure: [place] AND CLOT EVACUATION;  Surgeon: Alexis Frock, MD;  [facility];  Service: Urology;  Laterality: N/A;  . CYSTOSCOPY WITH FULGERATION N/A 12/13/2017   Procedure: CYSTOSCOPY WITH FULGERATION OF BLEEDERS;  Surgeon: Irine Seal, MD;  [facility];  Service: Urology;  Laterality: N/A;  . LYMPHADENECTOMY Bilateral 03/06/2018   Procedure: LYMPHADENECTOMY;  Surgeon: Alexis Frock, MD;  [facility];  Service: Urology;  Laterality: Bilateral;  . ROBOT ASSISTED LAPAROSCOPIC COMPLETE CYSTECT ILEAL CONDUIT N/A 03/06/2018   Procedure: XI ROBOTIC ASSISTED LAPAROSCOPIC COMPLETE CYSTECT ILEAL CONDUIT;  Surgeon: Alexis Frock, MD;  [facility];  Service: Urology;  Laterality: N/A;  . ROBOT ASSISTED LAPAROSCOPIC RADICAL PROSTATECTOMY     10/06/09   Social History   Socioeconomic History  . Marital status: Single    Spouse name: Not on file  . Number of children: Not on file  . Years of education: Not on file  . 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 Smoking status: Former Smoker    Packs/day: 1.00    Start date: 45    Quit date: 05/31/1979    Years since quitting: 40.3  . Smokeless tobacco: Never Used  Substance and Sexual Activity  . Alcohol use: Yes    Comment: 4 alcoholic beverages/week  . Drug use: No  . Sexual activity: Not on file  Other Topics Concern  . Not on file  Social  History Narrative  . Not on file   Social Determinants of Health   Financial Resource Strain:   . Difficulty of Paying Living Expenses:   Food Insecurity:   . Worried About Charity fundraiser in the Last Year:   . Arboriculturist in the Last Year:   Transportation Needs:   . Film/video editor (Medical):   Marland Kitchen Lack of Transportation (Non-Medical):   Physical Activity:   . Days of Exercise per Week:   . Minutes of Exercise per Session:   Stress:   . Feeling of Stress :   Social Connections:   . Frequency of Communication with Friends and Family:   . Frequency of Social Gatherings with Friends and Family:   . Attends Religious Services:   . Active Member of Clubs or Organizations:   . Attends Archivist Meetings:   Marland Kitchen Marital Status:    Family History  Problem Relation Age of Onset  . Cancer Mother        breast  . Kidney failure Father   . Prostate cancer Father    No Known Allergies Prior to Admission medications   Medication Sig Start Date End Date Taking? Authorizing Provider  atorvastatin (LIPITOR) 40 MG tablet Take 1 tablet (40 mg total) by mouth daily at 6 PM. 06/01/16  Yes Katheren Shams, DO  megestrol (MEGACE ES) 625 MG/5ML suspension Take 625 mg by mouth daily.  Yes [provider]  megestrol (MEGACE) 40 MG/ML suspension Take 10 mLs (400 mg total) by mouth 2 (two) times daily. 03/19/18  Yes Shelly Coss, MD  venlafaxine XR (EFFEXOR-XR) 37.5 MG 24 hr capsule Take 37.5 mg by mouth daily with breakfast.  04/18/18  Yes [provider]     Positive ROS: Otherwise negative  All other systems have been reviewed and were otherwise negative with the exception of those mentioned in the HPI and as above.  Physical Exam: Constitutional: Alert, well-appearing, no acute distress Ears: External ears without lesions or tenderness. Ear canals has large amount of wax obstructing the right ear canal that was very hard and impacted.  This was  removed using forceps and curette.  The TM was clear.  Of note he had a little bit of bleeding when the wax was removed and this was controlled with small amount of cotton packing and ceased.  He had minimal wax on the left side that was also cleaned.  TM was clear bilaterally. Nasal: External nose without lesions. Clear nasal passages Oral: Oropharynx clear. Neck: No palpable adenopathy or masses Respiratory: Breathing comfortably  Skin: No facial/neck lesions or rash noted.  Cerumen impaction removal  Date/Time: 10/01/2019 1:20 PM Performed by: Rozetta Nunnery, MD Authorized by: Rozetta Nunnery, MD   Consent:    Consent obtained:  Verbal   Consent given by:  Patient   Risks discussed:  Pain and bleeding Procedure details:    Location:  L ear and R ear   Procedure type: curette, suction and forceps   Post-procedure details:    Inspection:  TM intact and canal normal   Hearing quality:  Improved   Patient tolerance of procedure:  Tolerated well, no immediate complications Comments:     TMs were clear bilaterally.  Of note patient has small amount of bleeding from the right ear canal that was controlled with packing with a small cotton ball.  This was removed and the bleeding ceased.    Assessment: Bilateral cerumen impactions right side worse than left  Plan: He will follow-up as needed  Radene Journey, MD

## 2019-10-02 DIAGNOSIS — C61 Malignant neoplasm of prostate: Secondary | ICD-10-CM | POA: Diagnosis not present

## 2019-10-07 DIAGNOSIS — C775 Secondary and unspecified malignant neoplasm of intrapelvic lymph nodes: Secondary | ICD-10-CM | POA: Diagnosis not present

## 2019-10-07 DIAGNOSIS — C61 Malignant neoplasm of prostate: Secondary | ICD-10-CM | POA: Diagnosis not present

## 2019-10-07 DIAGNOSIS — N3041 Irradiation cystitis with hematuria: Secondary | ICD-10-CM | POA: Diagnosis not present

## 2019-10-14 IMAGING — CT CT ABD-PELV W/ CM
2 of 5 series · 14 of 46 positions shown, 16 images · IV contrast (ISOVUE)
Comparison: Preoperative PET CT 07/10/2017

CLINICAL DATA: Acute generalized abdominal pain. Post cystectomy
with ileal conduit.

EXAM:
CT ABDOMEN AND PELVIS WITH CONTRAST
TECHNIQUE: Multidetector CT imaging of the abdomen and pelvis was performed
using the standard protocol following bolus administration of
intravenous contrast.
CONTRAST:  100mL P1NEFN-3OO IOPAMIDOL (P1NEFN-3OO) INJECTION 61%

[Series 2: axial st · axial · 0.70mm/px · z∈[-185,+210]mm · 11 of 93 slices shown, 13 images]
[im 7/93  soft-tissue]
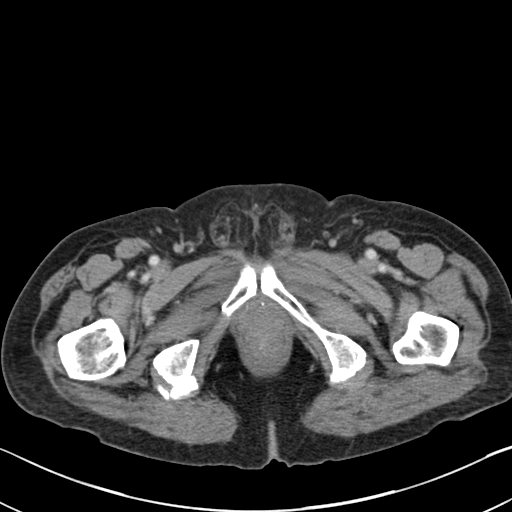
[im 7/93  bone]
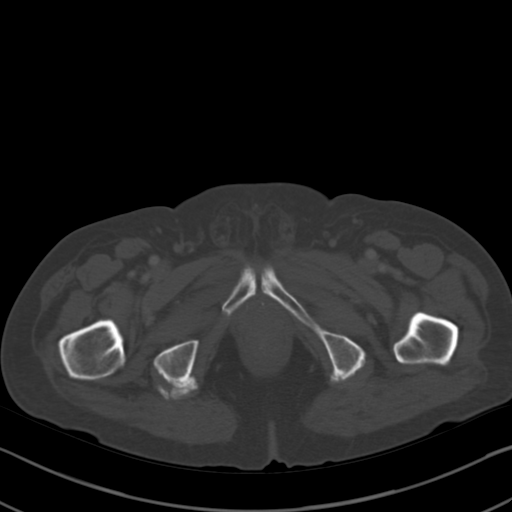
[im 13/93  soft-tissue]
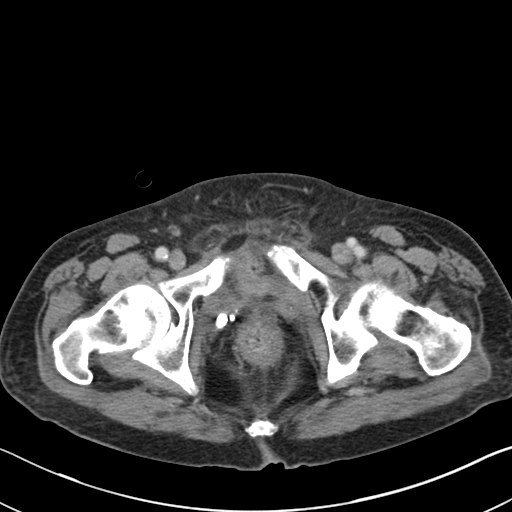
[im 25/93  soft-tissue]
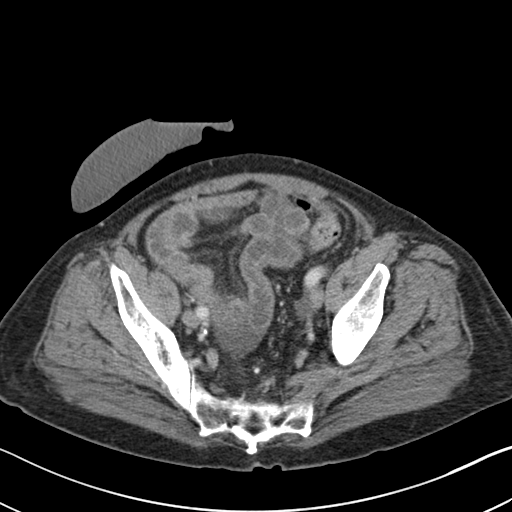
[im 31/93  soft-tissue]
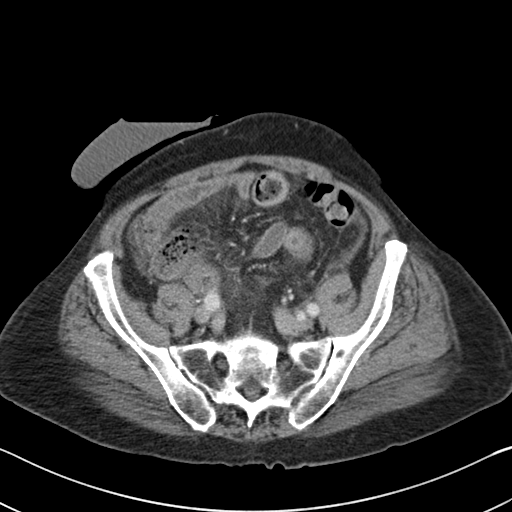
[im 37/93  soft-tissue]
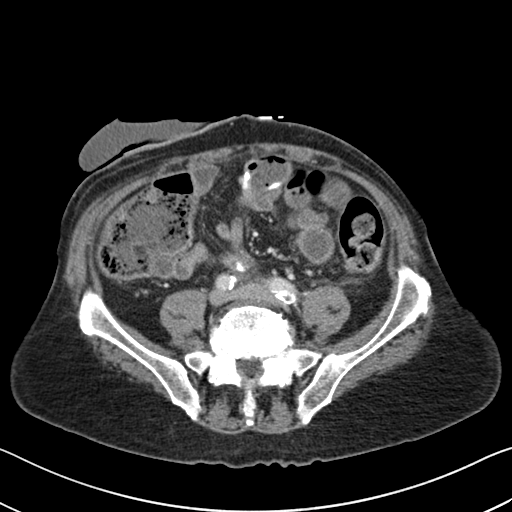
[im 50/93  soft-tissue]
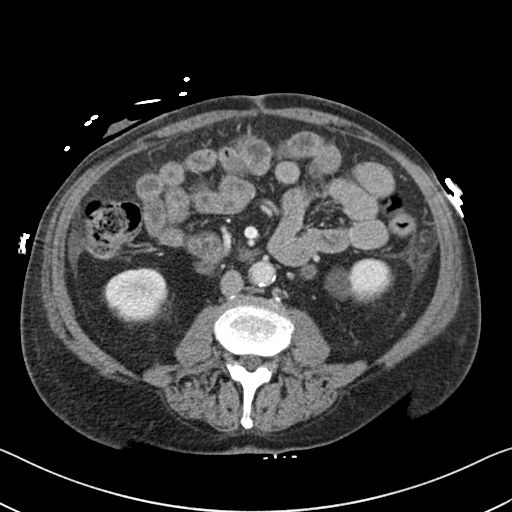
[im 56/93  soft-tissue]
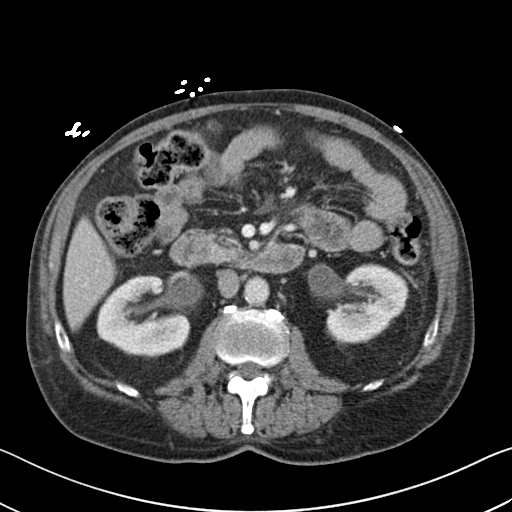
[im 62/93  soft-tissue]
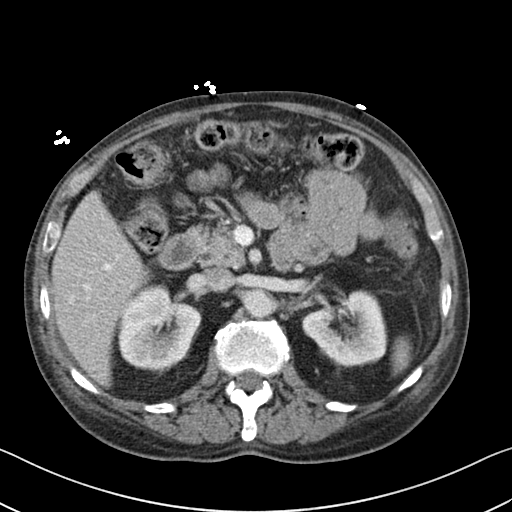
[im 68/93  soft-tissue]
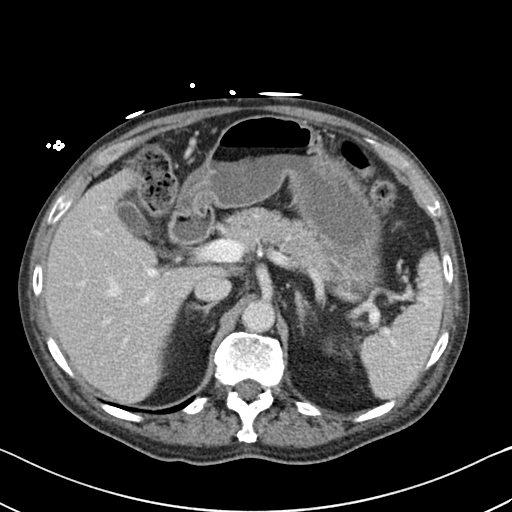
[im 68/93  bone]
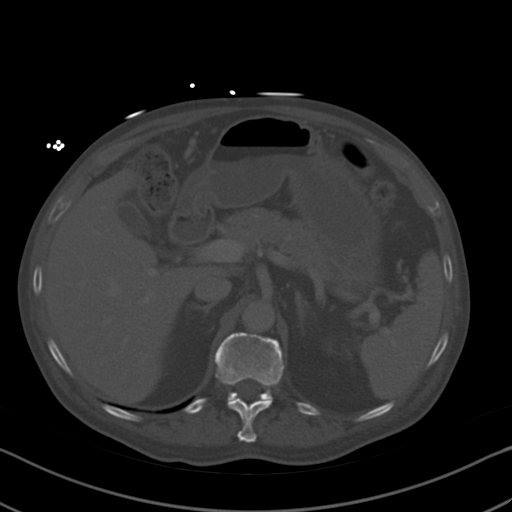
[im 80/93  soft-tissue]
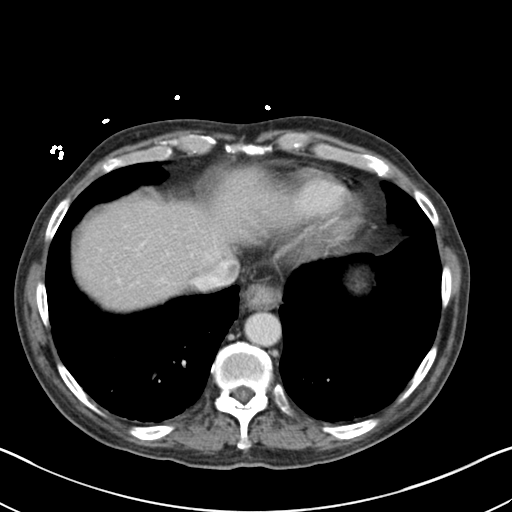
[im 86/93  soft-tissue]
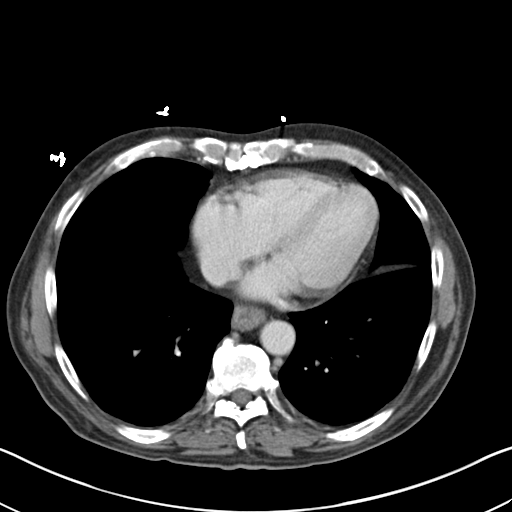

[Series 5: coronal st · coronal · 0.62mm/px · 3 of 88 slices shown]
[im 30/88  soft-tissue]
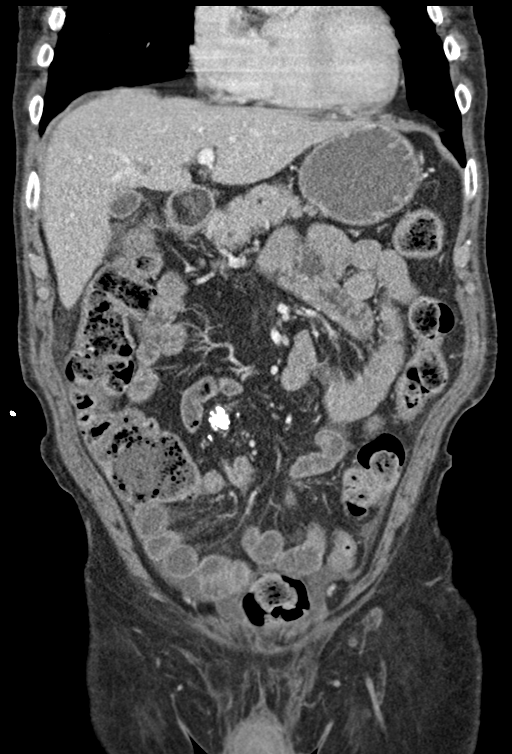
[im 39/88  soft-tissue]
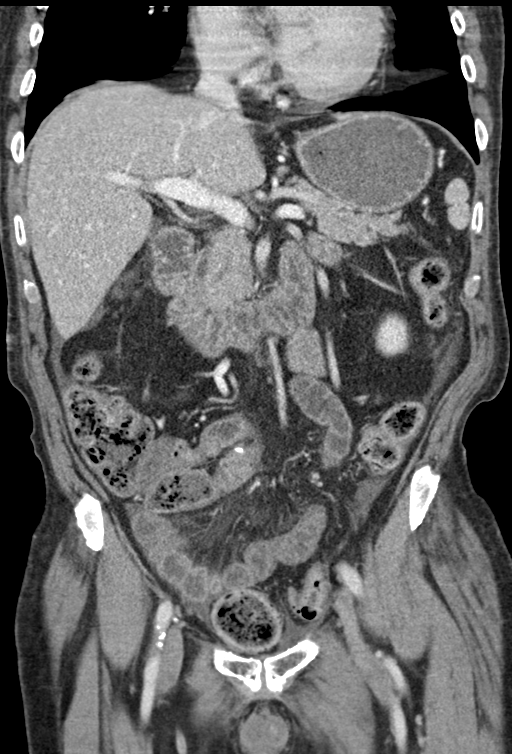
[im 49/88  soft-tissue]
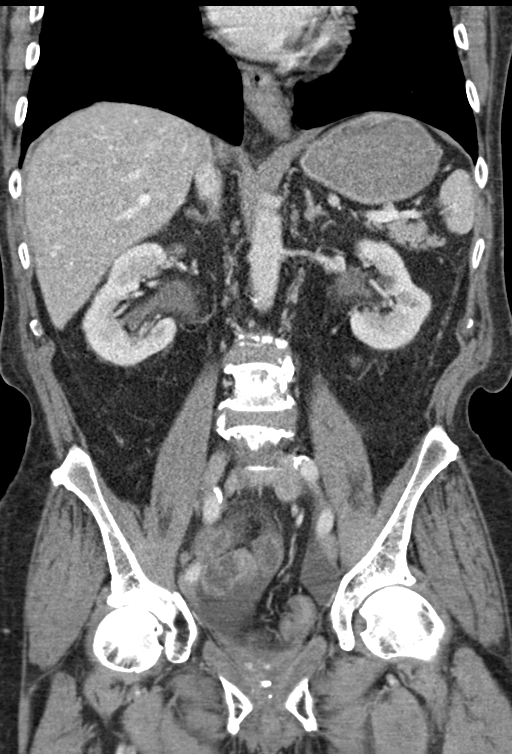

[14 of 46 positions shown; findings below may reference images not displayed]

FINDINGS: Lower chest: Breathing motion artifact. No confluent consolidation
or pleural effusion. There are coronary artery calcifications.

Hepatobiliary: Non-specific 9 mm low-density adjacent to the middle
hepatic vein. Gallbladder partially distended. No calcified
gallstone or biliary dilatation.

Pancreas: No ductal dilatation or inflammation.

Spleen: Normal in size without focal abnormality.

Adrenals/Urinary Tract: Normal adrenal glands. Post cystectomy. Mild
symmetric bilateral renal collecting system prominence with
homogeneous renal enhancement. Symmetric renal excretion on delayed
phase imaging. No significant perinephric edema. Unremarkable
appearance of the ileal conduit in the right abdomen.

Stomach/Bowel: Mild distal esophageal wall thickening. Ingested
material distends the stomach. Pelvic bowel loops are fluid-filled
with mucosal enhancement and mesenteric edema. No abnormal
dilatation to suggest obstruction. Enteric sutures in ileal bowel
loops without obstruction. The appendix is not visualized. Moderate
stool throughout the colon without colonic wall thickening or
inflammatory change.

Vascular/Lymphatic: Aorto bi-iliac atherosclerosis. No aneurysm.
Mesenteric vessels are patent. Prior enlarged retroperitoneal lymph
nodes have resolved. The prior hypometabolic pelvic lymph nodes on
PET have also resolved. No new or progressive adenopathy.

Reproductive: Prior prostatectomy.

Other: Pelvic free fluid, majority likely reactive and secondary to
small bowel inflammation. Small amount of free fluid in the
pericolic gutters, also likely reactive. Small fluid collection in
the left pelvis appears slightly more organized measuring 2.8 x 2 cm
but no peripheral enhancement. This may be a postoperative seroma or
peritoneal inclusion cyst. No free air.

Musculoskeletal: There are no acute or suspicious osseous
abnormalities. No focal osseous lesion. Degenerative change in the
spine.
IMPRESSION: 1. Enteritis involving ileal pelvic bowel loops with mucosal
hyperemia, bowel wall thickening and mesenteric edema.
2. Free fluid in the pelvis is likely reactive secondary to adjacent
bowel inflammation. Slightly more organized area of fluid in the
left pelvis measuring 2.8 x 2 cm may be postsurgical or reactive, no
peripheral enhancement to suggest abscess/infection.
3. Post cystectomy with ileal conduit. Symmetric prominence of the
renal collecting systems likely postoperative.
4. Small 9 mm low-density in the central liver is nonspecific in the
setting of prior malignancy. Recommend either attention at follow-up
or further characterization with hepatic MRI on an elective basis.
5. Previous retroperitoneal and pelvic adenopathy on PET has
resolved.
6.  Aortic Atherosclerosis ([id number]).

## 2019-10-22 DIAGNOSIS — H903 Sensorineural hearing loss, bilateral: Secondary | ICD-10-CM | POA: Diagnosis not present

## 2019-11-26 DIAGNOSIS — Z436 Encounter for attention to other artificial openings of urinary tract: Secondary | ICD-10-CM | POA: Diagnosis not present

## 2020-03-05 DIAGNOSIS — Z436 Encounter for attention to other artificial openings of urinary tract: Secondary | ICD-10-CM | POA: Diagnosis not present

## 2020-04-08 DIAGNOSIS — C61 Malignant neoplasm of prostate: Secondary | ICD-10-CM | POA: Diagnosis not present

## 2020-04-15 DIAGNOSIS — Z936 Other artificial openings of urinary tract status: Secondary | ICD-10-CM | POA: Diagnosis not present

## 2020-04-15 DIAGNOSIS — C61 Malignant neoplasm of prostate: Secondary | ICD-10-CM | POA: Diagnosis not present

## 2020-04-15 DIAGNOSIS — Z5111 Encounter for antineoplastic chemotherapy: Secondary | ICD-10-CM | POA: Diagnosis not present

## 2020-04-15 DIAGNOSIS — C775 Secondary and unspecified malignant neoplasm of intrapelvic lymph nodes: Secondary | ICD-10-CM | POA: Diagnosis not present

## 2020-04-30 DIAGNOSIS — Z436 Encounter for attention to other artificial openings of urinary tract: Secondary | ICD-10-CM | POA: Diagnosis not present

## 2020-05-05 DIAGNOSIS — D692 Other nonthrombocytopenic purpura: Secondary | ICD-10-CM | POA: Diagnosis not present

## 2020-05-05 DIAGNOSIS — C61 Malignant neoplasm of prostate: Secondary | ICD-10-CM | POA: Diagnosis not present

## 2020-05-05 DIAGNOSIS — Z79899 Other long term (current) drug therapy: Secondary | ICD-10-CM | POA: Diagnosis not present

## 2020-05-05 DIAGNOSIS — I6529 Occlusion and stenosis of unspecified carotid artery: Secondary | ICD-10-CM | POA: Diagnosis not present

## 2020-05-05 DIAGNOSIS — E785 Hyperlipidemia, unspecified: Secondary | ICD-10-CM | POA: Diagnosis not present

## 2020-05-05 DIAGNOSIS — Z0000 Encounter for general adult medical examination without abnormal findings: Secondary | ICD-10-CM | POA: Diagnosis not present

## 2020-05-05 DIAGNOSIS — F325 Major depressive disorder, single episode, in full remission: Secondary | ICD-10-CM | POA: Diagnosis not present

## 2020-05-05 DIAGNOSIS — G8194 Hemiplegia, unspecified affecting left nondominant side: Secondary | ICD-10-CM | POA: Diagnosis not present

## 2020-05-05 DIAGNOSIS — I700 Atherosclerosis of aorta: Secondary | ICD-10-CM | POA: Diagnosis not present

## 2020-05-05 DIAGNOSIS — Z936 Other artificial openings of urinary tract status: Secondary | ICD-10-CM | POA: Diagnosis not present

## 2020-05-05 DIAGNOSIS — I517 Cardiomegaly: Secondary | ICD-10-CM | POA: Diagnosis not present

## 2020-06-07 DIAGNOSIS — Z432 Encounter for attention to ileostomy: Secondary | ICD-10-CM | POA: Diagnosis not present

## 2020-07-27 DIAGNOSIS — Z432 Encounter for attention to ileostomy: Secondary | ICD-10-CM | POA: Diagnosis not present

## 2020-09-15 DIAGNOSIS — Z432 Encounter for attention to ileostomy: Secondary | ICD-10-CM | POA: Diagnosis not present

## 2020-10-13 DIAGNOSIS — C61 Malignant neoplasm of prostate: Secondary | ICD-10-CM | POA: Diagnosis not present

## 2020-10-14 DIAGNOSIS — K439 Ventral hernia without obstruction or gangrene: Secondary | ICD-10-CM | POA: Diagnosis not present

## 2020-10-14 DIAGNOSIS — N281 Cyst of kidney, acquired: Secondary | ICD-10-CM | POA: Diagnosis not present

## 2020-10-14 DIAGNOSIS — C61 Malignant neoplasm of prostate: Secondary | ICD-10-CM | POA: Diagnosis not present

## 2020-10-14 DIAGNOSIS — K449 Diaphragmatic hernia without obstruction or gangrene: Secondary | ICD-10-CM | POA: Diagnosis not present

## 2020-10-14 DIAGNOSIS — Z8546 Personal history of malignant neoplasm of prostate: Secondary | ICD-10-CM | POA: Diagnosis not present

## 2020-10-18 DIAGNOSIS — Z936 Other artificial openings of urinary tract status: Secondary | ICD-10-CM | POA: Diagnosis not present

## 2020-10-18 DIAGNOSIS — C775 Secondary and unspecified malignant neoplasm of intrapelvic lymph nodes: Secondary | ICD-10-CM | POA: Diagnosis not present

## 2020-10-18 DIAGNOSIS — C61 Malignant neoplasm of prostate: Secondary | ICD-10-CM | POA: Diagnosis not present

## 2020-11-01 DIAGNOSIS — U071 COVID-19: Secondary | ICD-10-CM | POA: Diagnosis not present

## 2020-11-01 DIAGNOSIS — E7800 Pure hypercholesterolemia, unspecified: Secondary | ICD-10-CM | POA: Diagnosis not present

## 2020-11-01 DIAGNOSIS — Z8546 Personal history of malignant neoplasm of prostate: Secondary | ICD-10-CM | POA: Diagnosis not present

## 2020-11-01 DIAGNOSIS — R0602 Shortness of breath: Secondary | ICD-10-CM | POA: Diagnosis not present

## 2020-12-17 DIAGNOSIS — Z436 Encounter for attention to other artificial openings of urinary tract: Secondary | ICD-10-CM | POA: Diagnosis not present

## 2021-05-10 DIAGNOSIS — C61 Malignant neoplasm of prostate: Secondary | ICD-10-CM | POA: Diagnosis not present

## 2021-05-17 DIAGNOSIS — C775 Secondary and unspecified malignant neoplasm of intrapelvic lymph nodes: Secondary | ICD-10-CM | POA: Diagnosis not present

## 2021-05-17 DIAGNOSIS — Z5111 Encounter for antineoplastic chemotherapy: Secondary | ICD-10-CM | POA: Diagnosis not present

## 2021-05-17 DIAGNOSIS — C61 Malignant neoplasm of prostate: Secondary | ICD-10-CM | POA: Diagnosis not present

## 2021-05-17 DIAGNOSIS — Z936 Other artificial openings of urinary tract status: Secondary | ICD-10-CM | POA: Diagnosis not present

## 2021-05-23 DIAGNOSIS — M5136 Other intervertebral disc degeneration, lumbar region: Secondary | ICD-10-CM | POA: Diagnosis not present

## 2021-05-23 DIAGNOSIS — E785 Hyperlipidemia, unspecified: Secondary | ICD-10-CM | POA: Diagnosis not present

## 2021-05-23 DIAGNOSIS — I69359 Hemiplegia and hemiparesis following cerebral infarction affecting unspecified side: Secondary | ICD-10-CM | POA: Diagnosis not present

## 2021-05-23 DIAGNOSIS — Z0000 Encounter for general adult medical examination without abnormal findings: Secondary | ICD-10-CM | POA: Diagnosis not present

## 2021-05-23 DIAGNOSIS — I6529 Occlusion and stenosis of unspecified carotid artery: Secondary | ICD-10-CM | POA: Diagnosis not present

## 2021-05-23 DIAGNOSIS — I517 Cardiomegaly: Secondary | ICD-10-CM | POA: Diagnosis not present

## 2021-05-23 DIAGNOSIS — R7301 Impaired fasting glucose: Secondary | ICD-10-CM | POA: Diagnosis not present

## 2021-05-23 DIAGNOSIS — F325 Major depressive disorder, single episode, in full remission: Secondary | ICD-10-CM | POA: Diagnosis not present

## 2021-05-23 DIAGNOSIS — C61 Malignant neoplasm of prostate: Secondary | ICD-10-CM | POA: Diagnosis not present

## 2021-05-23 DIAGNOSIS — Z79899 Other long term (current) drug therapy: Secondary | ICD-10-CM | POA: Diagnosis not present

## 2021-05-23 DIAGNOSIS — I700 Atherosclerosis of aorta: Secondary | ICD-10-CM | POA: Diagnosis not present

## 2021-05-23 DIAGNOSIS — Z8546 Personal history of malignant neoplasm of prostate: Secondary | ICD-10-CM | POA: Diagnosis not present

## 2021-07-12 DIAGNOSIS — Z436 Encounter for attention to other artificial openings of urinary tract: Secondary | ICD-10-CM | POA: Diagnosis not present

## 2021-11-10 ENCOUNTER — Other Ambulatory Visit (HOSPITAL_COMMUNITY): Payer: Self-pay | Admitting: Urology

## 2021-11-10 ENCOUNTER — Other Ambulatory Visit: Payer: Self-pay | Admitting: Urology

## 2021-11-10 DIAGNOSIS — C61 Malignant neoplasm of prostate: Secondary | ICD-10-CM

## 2021-12-08 ENCOUNTER — Ambulatory Visit (HOSPITAL_COMMUNITY)
Admission: RE | Admit: 2021-12-08 | Discharge: 2021-12-08 | Disposition: A | Discharge status: Home / Self Care | Payer: Medicare PPO | Source: Ambulatory Visit | Attending: Urology | Admitting: Urology

## 2021-12-08 DIAGNOSIS — Z8546 Personal history of malignant neoplasm of prostate: Secondary | ICD-10-CM | POA: Diagnosis not present

## 2021-12-08 DIAGNOSIS — C61 Malignant neoplasm of prostate: Secondary | ICD-10-CM | POA: Diagnosis not present

## 2021-12-15 DIAGNOSIS — C61 Malignant neoplasm of prostate: Secondary | ICD-10-CM | POA: Diagnosis not present

## 2021-12-15 DIAGNOSIS — K439 Ventral hernia without obstruction or gangrene: Secondary | ICD-10-CM | POA: Diagnosis not present

## 2021-12-15 DIAGNOSIS — Z87448 Personal history of other diseases of urinary system: Secondary | ICD-10-CM | POA: Diagnosis not present

## 2021-12-15 DIAGNOSIS — R339 Retention of urine, unspecified: Secondary | ICD-10-CM | POA: Diagnosis not present

## 2021-12-22 DIAGNOSIS — Z936 Other artificial openings of urinary tract status: Secondary | ICD-10-CM | POA: Diagnosis not present

## 2021-12-22 DIAGNOSIS — C775 Secondary and unspecified malignant neoplasm of intrapelvic lymph nodes: Secondary | ICD-10-CM | POA: Diagnosis not present

## 2021-12-22 DIAGNOSIS — N3041 Irradiation cystitis with hematuria: Secondary | ICD-10-CM | POA: Diagnosis not present

## 2021-12-22 DIAGNOSIS — C61 Malignant neoplasm of prostate: Secondary | ICD-10-CM | POA: Diagnosis not present

## 2022-08-03 DIAGNOSIS — Z436 Encounter for attention to other artificial openings of urinary tract: Secondary | ICD-10-CM | POA: Diagnosis not present

## 2022-09-06 DIAGNOSIS — Z79899 Other long term (current) drug therapy: Secondary | ICD-10-CM | POA: Diagnosis not present

## 2022-09-06 DIAGNOSIS — Z0000 Encounter for general adult medical examination without abnormal findings: Secondary | ICD-10-CM | POA: Diagnosis not present

## 2022-09-06 DIAGNOSIS — Z1330 Encounter for screening examination for mental health and behavioral disorders, unspecified: Secondary | ICD-10-CM | POA: Diagnosis not present

## 2022-09-06 DIAGNOSIS — E7800 Pure hypercholesterolemia, unspecified: Secondary | ICD-10-CM | POA: Diagnosis not present

## 2022-09-06 DIAGNOSIS — Z8546 Personal history of malignant neoplasm of prostate: Secondary | ICD-10-CM | POA: Diagnosis not present

## 2022-09-06 DIAGNOSIS — Z936 Other artificial openings of urinary tract status: Secondary | ICD-10-CM | POA: Diagnosis not present

## 2022-09-06 DIAGNOSIS — Z125 Encounter for screening for malignant neoplasm of prostate: Secondary | ICD-10-CM | POA: Diagnosis not present

## 2022-11-24 DIAGNOSIS — Z436 Encounter for attention to other artificial openings of urinary tract: Secondary | ICD-10-CM | POA: Diagnosis not present

## 2023-03-26 DIAGNOSIS — Z436 Encounter for attention to other artificial openings of urinary tract: Secondary | ICD-10-CM | POA: Diagnosis not present

## 2023-04-05 DIAGNOSIS — Z125 Encounter for screening for malignant neoplasm of prostate: Secondary | ICD-10-CM | POA: Diagnosis not present

## 2023-04-05 DIAGNOSIS — Z79899 Other long term (current) drug therapy: Secondary | ICD-10-CM | POA: Diagnosis not present

## 2023-04-11 DIAGNOSIS — R972 Elevated prostate specific antigen [PSA]: Secondary | ICD-10-CM | POA: Diagnosis not present

## 2023-04-11 DIAGNOSIS — Z23 Encounter for immunization: Secondary | ICD-10-CM | POA: Diagnosis not present

## 2023-04-11 DIAGNOSIS — E559 Vitamin D deficiency, unspecified: Secondary | ICD-10-CM | POA: Diagnosis not present

## 2023-04-11 DIAGNOSIS — Z8546 Personal history of malignant neoplasm of prostate: Secondary | ICD-10-CM | POA: Diagnosis not present

## 2023-04-11 DIAGNOSIS — Z79899 Other long term (current) drug therapy: Secondary | ICD-10-CM | POA: Diagnosis not present

## 2023-04-11 DIAGNOSIS — E7800 Pure hypercholesterolemia, unspecified: Secondary | ICD-10-CM | POA: Diagnosis not present

## 2023-06-19 DIAGNOSIS — Z436 Encounter for attention to other artificial openings of urinary tract: Secondary | ICD-10-CM | POA: Diagnosis not present

## 2023-09-06 DIAGNOSIS — C7989 Secondary malignant neoplasm of other specified sites: Secondary | ICD-10-CM | POA: Diagnosis not present

## 2023-09-06 DIAGNOSIS — Z9889 Other specified postprocedural states: Secondary | ICD-10-CM | POA: Diagnosis not present

## 2023-09-06 DIAGNOSIS — C61 Malignant neoplasm of prostate: Secondary | ICD-10-CM | POA: Diagnosis not present

## 2023-10-17 DIAGNOSIS — C61 Malignant neoplasm of prostate: Secondary | ICD-10-CM | POA: Diagnosis not present

## 2023-10-17 DIAGNOSIS — Z9889 Other specified postprocedural states: Secondary | ICD-10-CM | POA: Diagnosis not present

## 2023-10-17 DIAGNOSIS — C7989 Secondary malignant neoplasm of other specified sites: Secondary | ICD-10-CM | POA: Diagnosis not present

## 2023-11-14 DIAGNOSIS — C61 Malignant neoplasm of prostate: Secondary | ICD-10-CM | POA: Diagnosis not present

## 2023-11-28 DIAGNOSIS — C61 Malignant neoplasm of prostate: Secondary | ICD-10-CM | POA: Diagnosis not present

## 2023-12-04 DIAGNOSIS — C61 Malignant neoplasm of prostate: Secondary | ICD-10-CM | POA: Diagnosis not present

## 2023-12-05 DIAGNOSIS — C7951 Secondary malignant neoplasm of bone: Secondary | ICD-10-CM | POA: Diagnosis not present

## 2023-12-05 DIAGNOSIS — C61 Malignant neoplasm of prostate: Secondary | ICD-10-CM | POA: Diagnosis not present

## 2023-12-05 DIAGNOSIS — Z5111 Encounter for antineoplastic chemotherapy: Secondary | ICD-10-CM | POA: Diagnosis not present

## 2023-12-19 DIAGNOSIS — C7951 Secondary malignant neoplasm of bone: Secondary | ICD-10-CM | POA: Diagnosis not present

## 2023-12-19 DIAGNOSIS — C61 Malignant neoplasm of prostate: Secondary | ICD-10-CM | POA: Diagnosis not present

## 2023-12-19 DIAGNOSIS — Z5111 Encounter for antineoplastic chemotherapy: Secondary | ICD-10-CM | POA: Diagnosis not present

## 2023-12-19 DIAGNOSIS — E785 Hyperlipidemia, unspecified: Secondary | ICD-10-CM | POA: Diagnosis not present

## 2023-12-19 DIAGNOSIS — Z79899 Other long term (current) drug therapy: Secondary | ICD-10-CM | POA: Diagnosis not present

## 2023-12-25 DIAGNOSIS — C61 Malignant neoplasm of prostate: Secondary | ICD-10-CM | POA: Diagnosis not present

## 2024-01-07 DIAGNOSIS — Z79899 Other long term (current) drug therapy: Secondary | ICD-10-CM | POA: Diagnosis not present

## 2024-01-07 DIAGNOSIS — C61 Malignant neoplasm of prostate: Secondary | ICD-10-CM | POA: Diagnosis not present

## 2024-01-14 DIAGNOSIS — C61 Malignant neoplasm of prostate: Secondary | ICD-10-CM | POA: Diagnosis not present

## 2024-01-21 DIAGNOSIS — C61 Malignant neoplasm of prostate: Secondary | ICD-10-CM | POA: Diagnosis not present

## 2024-01-22 DIAGNOSIS — C61 Malignant neoplasm of prostate: Secondary | ICD-10-CM | POA: Diagnosis not present

## 2024-01-26 DIAGNOSIS — C61 Malignant neoplasm of prostate: Secondary | ICD-10-CM | POA: Diagnosis not present

## 2024-01-28 DIAGNOSIS — C61 Malignant neoplasm of prostate: Secondary | ICD-10-CM | POA: Diagnosis not present

## 2024-02-04 DIAGNOSIS — C61 Malignant neoplasm of prostate: Secondary | ICD-10-CM | POA: Diagnosis not present

## 2024-02-11 DIAGNOSIS — C61 Malignant neoplasm of prostate: Secondary | ICD-10-CM | POA: Diagnosis not present

## 2024-02-18 DIAGNOSIS — C61 Malignant neoplasm of prostate: Secondary | ICD-10-CM | POA: Diagnosis not present

## 2024-02-19 DIAGNOSIS — C61 Malignant neoplasm of prostate: Secondary | ICD-10-CM | POA: Diagnosis not present

## 2024-03-12 DIAGNOSIS — C7951 Secondary malignant neoplasm of bone: Secondary | ICD-10-CM | POA: Diagnosis not present

## 2024-03-12 DIAGNOSIS — C61 Malignant neoplasm of prostate: Secondary | ICD-10-CM | POA: Diagnosis not present

## 2024-03-12 DIAGNOSIS — Z5111 Encounter for antineoplastic chemotherapy: Secondary | ICD-10-CM | POA: Diagnosis not present
